# Patient Record
Sex: Female | Born: 2000 | Hispanic: Yes | Marital: Single | Smoking: Never smoker
Health system: Southern US, Community
[De-identification: ages and names within clinical notes are randomized; demographics above are authoritative.]

## PROBLEM LIST (undated history)

## (undated) DIAGNOSIS — R011 Cardiac murmur, unspecified: Secondary | ICD-10-CM

## (undated) DIAGNOSIS — R87629 Unspecified abnormal cytological findings in specimens from vagina: Secondary | ICD-10-CM

## (undated) DIAGNOSIS — F32A Depression, unspecified: Secondary | ICD-10-CM

## (undated) DIAGNOSIS — D696 Thrombocytopenia, unspecified: Secondary | ICD-10-CM

## (undated) DIAGNOSIS — D649 Anemia, unspecified: Secondary | ICD-10-CM

## (undated) DIAGNOSIS — F419 Anxiety disorder, unspecified: Secondary | ICD-10-CM

## (undated) HISTORY — PX: NEPHROSTOMY TUBE PLACEMENT (ARMC HX): HXRAD1726

---
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
## 2019-08-18 LAB — OB RESULTS CONSOLE RUBELLA ANTIBODY, IGM: Rubella: NON-IMMUNE/NOT IMMUNE

## 2019-08-23 DIAGNOSIS — O219 Vomiting of pregnancy, unspecified: Secondary | ICD-10-CM | POA: Insufficient documentation

## 2019-11-11 ENCOUNTER — Other Ambulatory Visit: Payer: Self-pay

## 2019-11-11 ENCOUNTER — Ambulatory Visit (HOSPITAL_COMMUNITY)
Admission: EM | Admit: 2019-11-11 | Discharge: 2019-11-11 | Disposition: A | Discharge status: Home / Self Care | Payer: Medicaid Other | Attending: Urgent Care | Admitting: Urgent Care

## 2019-11-11 ENCOUNTER — Encounter (HOSPITAL_COMMUNITY): Payer: Self-pay

## 2019-11-11 DIAGNOSIS — Z20822 Contact with and (suspected) exposure to covid-19: Secondary | ICD-10-CM | POA: Diagnosis not present

## 2019-11-11 NOTE — Discharge Instructions (Addendum)
We have tested you for COVID  Go home and quarantine until we get our results.

## 2019-11-11 NOTE — ED Triage Notes (Signed)
Pt present possible covid exposure from her son. Pt denies any symptoms at this time.

## 2019-11-12 LAB — SARS CORONAVIRUS 2 (TAT 6-24 HRS): SARS Coronavirus 2: NEGATIVE

## 2019-11-14 NOTE — ED Provider Notes (Signed)
[facility]    CSN: [id number] Arrival date & time: 11/11/19  1200      History   Chief Complaint Chief Complaint  Patient presents with  . Covid Exposure    HPI Victoria Garrett is a 19 y.o. female.   Patient is a 19 year old female who presents today for possible Covid exposure from her son.  She is currently having no symptoms.     History reviewed. No pertinent past medical history.  There are no problems to display for this patient.   History reviewed. No pertinent surgical history.  OB History    Gravida  1   Para      Term      Preterm      AB      Living        SAB      TAB      Ectopic      Multiple      Live Births               Home Medications    Prior to Admission medications   Not on File    Family History History reviewed. No pertinent family history.  Social History Social History   Tobacco Use  . Smoking status: Not on file  Substance Use Topics  . Alcohol use: Not on file  . Drug use: Not on file     Allergies   Patient has no known allergies.   Review of Systems Review of Systems   Physical Exam Triage Vital Signs ED Triage Vitals  Enc Vitals Group     BP 11/11/19 1421 118/61     Pulse Rate 11/11/19 1421 89     Resp 11/11/19 1421 16     Temp 11/11/19 1421 98.6 F (37 C)     Temp Source 11/11/19 1421 Oral     SpO2 11/11/19 1421 100 %     Weight --      Height --      Head Circumference --      Peak Flow --      Pain Score 11/11/19 1422 0     Pain Loc --      Pain Edu? --      Excl. in GC? --    No data found.  Updated Vital Signs BP 118/61 (BP Location: Right Arm)   Pulse 89   Temp 98.6 F (37 C) (Oral)   Resp 16   SpO2 100%   Visual Acuity Right Eye Distance:   Left Eye Distance:   Bilateral Distance:    Right Eye Near:   Left Eye Near:    Bilateral Near:     Physical Exam Vitals and nursing note reviewed.  Constitutional:      General: She is not in  acute distress.    Appearance: Normal appearance. She is not ill-appearing, toxic-appearing or diaphoretic.  HENT:     Head: Normocephalic.     Nose: Nose normal.  Eyes:     Conjunctiva/sclera: Conjunctivae normal.  Pulmonary:     Effort: Pulmonary effort is normal.  Musculoskeletal:        General: Normal range of motion.     Cervical back: Normal range of motion.  Skin:    General: Skin is warm and dry.     Findings: No rash.  Neurological:     Mental Status: She is alert.  Psychiatric:        Mood and Affect:  Mood normal.      UC Treatments / Results  Labs (all labs ordered are listed, but only abnormal results are displayed) Labs Reviewed  SARS CORONAVIRUS 2 (TAT 6-24 HRS)    EKG   Radiology No results found.  Procedures Procedures (including critical care time)  Medications Ordered in UC Medications - No data to display  Initial Impression / Assessment and Plan / UC Course  I have reviewed the triage vital signs and the nursing notes.  Pertinent labs & imaging results that were available during my care of the patient were reviewed by me and considered in my medical decision making (see chart for details).     Covid testing Swab pending Final Clinical Impressions(s) / UC Diagnoses   Final diagnoses:  Encounter for laboratory testing for COVID-19 virus     Discharge Instructions     We have tested you for COVID  Go home and quarantine until we get our results.       ED Prescriptions    None     PDMP not reviewed this encounter.   Dahlia Byes A, NP 11/14/19 [phone]

## 2019-11-16 ENCOUNTER — Other Ambulatory Visit: Payer: Self-pay

## 2019-11-16 ENCOUNTER — Encounter (HOSPITAL_COMMUNITY): Payer: Self-pay | Admitting: Emergency Medicine

## 2019-11-16 ENCOUNTER — Emergency Department (HOSPITAL_COMMUNITY)
Admission: EM | Admit: 2019-11-16 | Discharge: 2019-11-17 | Disposition: A | Discharge status: Home / Self Care | Payer: Medicaid Other | Attending: Emergency Medicine | Admitting: Emergency Medicine

## 2019-11-16 DIAGNOSIS — R200 Anesthesia of skin: Secondary | ICD-10-CM | POA: Diagnosis not present

## 2019-11-16 DIAGNOSIS — Z3A00 Weeks of gestation of pregnancy not specified: Secondary | ICD-10-CM | POA: Insufficient documentation

## 2019-11-16 DIAGNOSIS — O99891 Other specified diseases and conditions complicating pregnancy: Secondary | ICD-10-CM | POA: Insufficient documentation

## 2019-11-16 DIAGNOSIS — Z5321 Procedure and treatment not carried out due to patient leaving prior to being seen by health care provider: Secondary | ICD-10-CM | POA: Insufficient documentation

## 2019-11-16 LAB — COMPREHENSIVE METABOLIC PANEL
ALT: 15 U/L (ref 0–44)
AST: 16 U/L (ref 15–41)
Albumin: 3.6 g/dL (ref 3.5–5.0)
Alkaline Phosphatase: 49 U/L (ref 38–126)
Anion gap: 9 (ref 5–15)
BUN: <5 mg/dL — ABNORMAL LOW (ref 6–20)
CO2: 23 mmol/L (ref 22–32)
Calcium: 9.5 mg/dL (ref 8.9–10.3)
Chloride: 101 mmol/L (ref 98–111)
Creatinine, Ser: 0.35 mg/dL — ABNORMAL LOW (ref 0.44–1.00)
GFR calc Af Amer: >60 mL/min (ref >60)
GFR calc non Af Amer: >60 mL/min (ref >60)
Glucose, Bld: 103 mg/dL — ABNORMAL HIGH (ref 70–99)
Potassium: 3.2 mmol/L — ABNORMAL LOW (ref 3.5–5.1)
Sodium: 133 mmol/L — ABNORMAL LOW (ref 135–145)
Total Bilirubin: 0.6 mg/dL (ref 0.3–1.2)
Total Protein: 6.9 g/dL (ref 6.5–8.1)

## 2019-11-16 LAB — PROTIME-INR
INR: 1.1 (ref 0.8–1.2)
Prothrombin Time: 13.4 seconds (ref 11.4–15.2)

## 2019-11-16 LAB — CBC
HCT: 37.5 % (ref 36.0–46.0)
Hemoglobin: 12.8 g/dL (ref 12.0–15.0)
MCH: 28.8 pg (ref 26.0–34.0)
MCHC: 34.1 g/dL (ref 30.0–36.0)
MCV: 84.5 fL (ref 80.0–100.0)
Platelets: 124 10*3/uL — ABNORMAL LOW (ref 150–400)
RBC: 4.44 MIL/uL (ref 3.87–5.11)
RDW: 12.9 % (ref 11.5–15.5)
WBC: 8.5 10*3/uL (ref 4.0–10.5)
nRBC: 0 % (ref 0.0–0.2)

## 2019-11-16 LAB — DIFFERENTIAL
Abs Immature Granulocytes: 0.1 10*3/uL — ABNORMAL HIGH (ref 0.00–0.07)
Basophils Absolute: 0 10*3/uL (ref 0.0–0.1)
Basophils Relative: 0 %
Eosinophils Absolute: 0.1 10*3/uL (ref 0.0–0.5)
Eosinophils Relative: 2 %
Immature Granulocytes: 1 %
Lymphocytes Relative: 18 %
Lymphs Abs: 1.5 10*3/uL (ref 0.7–4.0)
Monocytes Absolute: 0.5 10*3/uL (ref 0.1–1.0)
Monocytes Relative: 6 %
Neutro Abs: 6.2 10*3/uL (ref 1.7–7.7)
Neutrophils Relative %: 73 %

## 2019-11-16 LAB — I-STAT CHEM 8, ED
BUN: <3 mg/dL — ABNORMAL LOW (ref 6–20)
Calcium, Ion: 1.29 mmol/L (ref 1.15–1.40)
Chloride: 101 mmol/L (ref 98–111)
Creatinine, Ser: 0.3 mg/dL — ABNORMAL LOW (ref 0.44–1.00)
Glucose, Bld: 100 mg/dL — ABNORMAL HIGH (ref 70–99)
HCT: 36 % (ref 36.0–46.0)
Hemoglobin: 12.2 g/dL (ref 12.0–15.0)
Potassium: 3.1 mmol/L — ABNORMAL LOW (ref 3.5–5.1)
Sodium: 138 mmol/L (ref 135–145)
TCO2: 24 mmol/L (ref 22–32)

## 2019-11-16 LAB — I-STAT BETA HCG BLOOD, ED (MC, WL, AP ONLY): I-stat hCG, quantitative: >2000 m[IU]/mL — ABNORMAL HIGH (ref <5)

## 2019-11-16 LAB — APTT: aPTT: 29 seconds (ref 24–36)

## 2019-11-16 MED ORDER — SODIUM CHLORIDE 0.9% FLUSH
3.0000 mL | Freq: Once | INTRAVENOUS | Status: DC
Start: 2019-11-16 — End: 2019-11-17

## 2019-11-16 NOTE — ED Triage Notes (Signed)
Pt presents to ED POV. Pt c/o intermittent L numbness. Reports that she does not know when it started. Pt states that @ 1720 she noticed her L foot was cyanotic and numb. LLE currently WDL, pulse +2 and cap < 3 seconds. Pt reports normal pregnancy

## 2019-11-17 NOTE — ED Notes (Signed)
Called for room x3 w/o response

## 2019-12-12 ENCOUNTER — Emergency Department (HOSPITAL_COMMUNITY)
Admission: EM | Admit: 2019-12-12 | Discharge: 2019-12-12 | Disposition: A | Discharge status: Home / Self Care | Payer: Medicaid Other | Attending: Emergency Medicine | Admitting: Emergency Medicine

## 2019-12-12 ENCOUNTER — Other Ambulatory Visit: Payer: Self-pay

## 2019-12-12 ENCOUNTER — Encounter (HOSPITAL_COMMUNITY): Payer: Self-pay | Admitting: Emergency Medicine

## 2019-12-12 DIAGNOSIS — R1010 Upper abdominal pain, unspecified: Secondary | ICD-10-CM | POA: Insufficient documentation

## 2019-12-12 DIAGNOSIS — Z20822 Contact with and (suspected) exposure to covid-19: Secondary | ICD-10-CM | POA: Insufficient documentation

## 2019-12-12 DIAGNOSIS — M545 Low back pain, unspecified: Secondary | ICD-10-CM | POA: Diagnosis not present

## 2019-12-12 DIAGNOSIS — R079 Chest pain, unspecified: Secondary | ICD-10-CM | POA: Insufficient documentation

## 2019-12-12 DIAGNOSIS — O26892 Other specified pregnancy related conditions, second trimester: Secondary | ICD-10-CM | POA: Diagnosis present

## 2019-12-12 DIAGNOSIS — O99891 Other specified diseases and conditions complicating pregnancy: Secondary | ICD-10-CM | POA: Insufficient documentation

## 2019-12-12 DIAGNOSIS — Z3A24 24 weeks gestation of pregnancy: Secondary | ICD-10-CM | POA: Insufficient documentation

## 2019-12-12 LAB — BASIC METABOLIC PANEL
Anion gap: 9 (ref 5–15)
BUN: <5 mg/dL — ABNORMAL LOW (ref 6–20)
CO2: 22 mmol/L (ref 22–32)
Calcium: 8.9 mg/dL (ref 8.9–10.3)
Chloride: 105 mmol/L (ref 98–111)
Creatinine, Ser: 0.38 mg/dL — ABNORMAL LOW (ref 0.44–1.00)
GFR, Estimated: >60 mL/min (ref >60)
Glucose, Bld: 85 mg/dL (ref 70–99)
Potassium: 3.4 mmol/L — ABNORMAL LOW (ref 3.5–5.1)
Sodium: 136 mmol/L (ref 135–145)

## 2019-12-12 LAB — URINALYSIS, ROUTINE W REFLEX MICROSCOPIC
Bilirubin Urine: NEGATIVE
Glucose, UA: NEGATIVE mg/dL
Hgb urine dipstick: NEGATIVE
Ketones, ur: 5 mg/dL — AB
Nitrite: NEGATIVE
Protein, ur: NEGATIVE mg/dL
Specific Gravity, Urine: 1.015 (ref 1.005–1.030)
pH: 7 (ref 5.0–8.0)

## 2019-12-12 LAB — CBC
HCT: 37 % (ref 36.0–46.0)
Hemoglobin: 12.5 g/dL (ref 12.0–15.0)
MCH: 29.3 pg (ref 26.0–34.0)
MCHC: 33.8 g/dL (ref 30.0–36.0)
MCV: 86.7 fL (ref 80.0–100.0)
Platelets: 105 10*3/uL — ABNORMAL LOW (ref 150–400)
RBC: 4.27 MIL/uL (ref 3.87–5.11)
RDW: 13.2 % (ref 11.5–15.5)
WBC: 7.9 10*3/uL (ref 4.0–10.5)
nRBC: 0 % (ref 0.0–0.2)

## 2019-12-12 LAB — TROPONIN I (HIGH SENSITIVITY)
Troponin I (High Sensitivity): <2 ng/L (ref <18)
Troponin I (High Sensitivity): 3 ng/L (ref <18)

## 2019-12-12 LAB — RESPIRATORY PANEL BY RT PCR (FLU A&B, COVID)
Influenza A by PCR: NEGATIVE
Influenza B by PCR: NEGATIVE
SARS Coronavirus 2 by RT PCR: NEGATIVE

## 2019-12-12 MED ORDER — ACETAMINOPHEN 500 MG PO TABS: 1000.0000 mg | ORAL_TABLET | Freq: Four times a day (QID) | ORAL | 0 refills | Status: DC | PRN

## 2019-12-12 MED ORDER — FAMOTIDINE 20 MG PO TABS: 20.0000 mg | ORAL_TABLET | Freq: Two times a day (BID) | ORAL | 1 refills | Status: DC

## 2019-12-12 NOTE — ED Notes (Signed)
[facility] at bedside 

## 2019-12-12 NOTE — Progress Notes (Signed)
Spoke with Dr. Jolayne Panther. Pt is obstetrically stable and can be OB cleared.

## 2019-12-12 NOTE — Discharge Instructions (Signed)
1.  Start taking Pepcid twice daily.  Follow instructions for gastroesophageal reflux disease.  This can commonly occur during pregnancy. 2.  Take Tylenol if needed for back pain. 3.  Follow-up with your obstetrician this week for recheck. 4.  Return to the emergency department if you develop a fever, any worsening symptoms or concerning symptoms.

## 2019-12-12 NOTE — ED Provider Notes (Signed)
[facility] EMERGENCY DEPARTMENT Provider Note   CSN: [id number] Arrival date & time: 12/12/19  1761     History No chief complaint on file.   Victoria Garrett is a 19 y.o. female.  HPI Patient reports she reports she has been having problems with discomfort in her upper abdomen and lower chest as well as her back in the flank region for a number of weeks off and on.  She reports she has seen her primary obstetrician regarding this.  She reports they have suspected it was due to increasing gestational age of the pregnancy.  Patient did have a complicated course in her first pregnancy with a severe pyelonephritis requiring nephrostomy tube.  She reports she became septic at that time.  With this pregnancy, she is not having any fevers or chills.  She is not having pain or burning with urination.  She has been having aching in her back bilaterally.  No vaginal discharge or bleeding.  She reports she has felt some general discomfort in her central chest.  Sometimes she feels some burning and some froth coming up in her throat.  She has had nausea but she has been able to eat and drink.  No cough.  No headache sore throat or body ache.  No swelling in the legs or the calves.    History reviewed. No pertinent past medical history.  There are no problems to display for this patient.   History reviewed. No pertinent surgical history.   OB History    Gravida  1   Para      Term      Preterm      AB      Living        SAB      TAB      Ectopic      Multiple      Live Births              No family history on file.  Social History   Tobacco Use  . Smoking status: Not on file  Substance Use Topics  . Alcohol use: Not on file  . Drug use: Not on file    Home Medications Prior to Admission medications   Medication Sig Start Date End Date Taking? Authorizing Provider  ondansetron (ZOFRAN-ODT) 4 MG disintegrating tablet Take 4 mg by mouth  daily as needed for nausea/vomiting. 09/29/19  Yes [provider]  acetaminophen (TYLENOL) 500 MG tablet Take 2 tablets (1,000 mg total) by mouth every 6 (six) hours as needed. 12/12/19   Arby Barrette, MD  famotidine (PEPCID) 20 MG tablet Take 1 tablet (20 mg total) by mouth 2 (two) times daily. 12/12/19   Arby Barrette, MD    Allergies    Patient has no known allergies.  Review of Systems   Review of Systems 10 systems reviewed and negative except as per HPI Physical Exam Updated Vital Signs BP 106/70   Pulse 72   Temp 98.6 F (37 C) (Oral)   Resp 19   Ht 5\' 2"  (1.575 m)   Wt 63.5 kg   SpO2 98%   BMI 25.61 kg/m   Physical Exam Constitutional:      Comments: Alert and well in appearance.  No respiratory distress.  Well-nourished well-developed.  HENT:     Head: Normocephalic and atraumatic.     Mouth/Throat:     Mouth: Mucous membranes are moist.     Pharynx: Oropharynx is clear.  Eyes:     Extraocular Movements: Extraocular movements intact.  Cardiovascular:     Rate and Rhythm: Normal rate and regular rhythm.  Pulmonary:     Effort: Pulmonary effort is normal.     Breath sounds: Normal breath sounds.  Abdominal:     Comments: Gravid abdomen diffuse discomfort to palpation upper abdomen.  Musculoskeletal:        General: No swelling or tenderness. Normal range of motion.     Right lower leg: No edema.     Left lower leg: No edema.  Skin:    General: Skin is warm and dry.  Neurological:     General: No focal deficit present.     Mental Status: She is oriented to person, place, and time.     Coordination: Coordination normal.  Psychiatric:        Mood and Affect: Mood normal.     ED Results / Procedures / Treatments   Labs (all labs ordered are listed, but only abnormal results are displayed) Labs Reviewed  BASIC METABOLIC PANEL - Abnormal; Notable for the following components:      Result Value   Potassium 3.4 (*)    BUN <5 (*)     Creatinine, Ser 0.38 (*)    All other components within normal limits  CBC - Abnormal; Notable for the following components:   Platelets 105 (*)    All other components within normal limits  URINALYSIS, ROUTINE W REFLEX MICROSCOPIC - Abnormal; Notable for the following components:   Color, Urine AMBER (*)    APPearance CLOUDY (*)    Ketones, ur 5 (*)    Leukocytes,Ua SMALL (*)    Bacteria, UA RARE (*)    All other components within normal limits  RESPIRATORY PANEL BY RT PCR (FLU A&B, COVID)  URINE CULTURE  TROPONIN I (HIGH SENSITIVITY)  TROPONIN I (HIGH SENSITIVITY)    EKG None  Radiology No results found.  Procedures Procedures (including critical care time)  Medications Ordered in ED Medications - No data to display  ED Course  I have reviewed the triage vital signs and the nursing notes.  Pertinent labs & imaging results that were available during my care of the patient were reviewed by me and considered in my medical decision making (see chart for details).    MDM Rules/Calculators/A&P                          [facility] has monitored the patient.  Good fetal heart tones.  No contractions.  No appearance of any immediate pregnancy related complications.  Patient is clinically well in appearance.  She has symptoms diffusely of abdominal discomfort, chest discomfort and back discomfort.  Currently, no hypoxia, fever or tachycardia.  Lungs are clear.  Review of systems does not suggest pulmonary infection.  Covid testing and flu testing is negative.  I have not proceeded with chest x-ray at this time due to lack of positive findings.  Patient also has epigastric discomfort and discomfort in her back.  Urinalysis is negative.  Will have urine cultured as she apparently had an episode of pyelonephritis that was severe during last pregnancy.  Symptoms have been going on for some time.  At this time it appears to be stable.  Plan will be to symptomatically treat with Pepcid for  possible GERD and gastritis type symptoms.  Tylenol if needed for back pain.  Close follow-up with OB/GYN and careful return precautions reviewed.  Final Clinical Impression(s) / ED Diagnoses Final diagnoses:  Pain of upper abdomen  [redacted] weeks gestation of pregnancy  Chest pain, unspecified type    Rx / DC Orders ED Discharge Orders         Ordered    famotidine (PEPCID) 20 MG tablet  2 times daily        12/12/19 1543    acetaminophen (TYLENOL) 500 MG tablet  Every 6 hours PRN        12/12/19 1543           Arby Barrette, MD 12/12/19 1550

## 2019-12-12 NOTE — ED Triage Notes (Signed)
Pt here with c/o left sided chest pain ,pt is around [redacted] weeks pregnant states had some chest pain with first pregnancy also and was seen at [facility]

## 2019-12-12 NOTE — Progress Notes (Signed)
Pt is a G2P1 at 24 6/[redacted] weeks gestation. She is here with c/o chest pain and left flank pain. She says that she had a lot of UTI's with her first pregnancy. A clean catch U/A was obtained and sent to the lab.I was told by the ED RN that her EKG was normal. Pt is not in any distress. Her skin is warm and dry and her v/s are within normal limits. No vaginal bleeding or leaking of fluid. She had a vaginal delivery with her son who is three years old.

## 2019-12-13 LAB — URINE CULTURE: Culture: <10000 — AB

## 2019-12-18 ENCOUNTER — Other Ambulatory Visit: Payer: Self-pay

## 2019-12-18 ENCOUNTER — Inpatient Hospital Stay (HOSPITAL_COMMUNITY)
Admission: AD | Admit: 2019-12-18 | Discharge: 2019-12-18 | Disposition: A | Discharge status: Home / Self Care | Payer: Medicaid Other | Attending: Obstetrics and Gynecology | Admitting: Obstetrics and Gynecology

## 2019-12-18 ENCOUNTER — Encounter (HOSPITAL_COMMUNITY): Payer: Self-pay | Admitting: Obstetrics & Gynecology

## 2019-12-18 DIAGNOSIS — O99342 Other mental disorders complicating pregnancy, second trimester: Secondary | ICD-10-CM | POA: Insufficient documentation

## 2019-12-18 DIAGNOSIS — Z79899 Other long term (current) drug therapy: Secondary | ICD-10-CM | POA: Insufficient documentation

## 2019-12-18 DIAGNOSIS — F410 Panic disorder [episodic paroxysmal anxiety] without agoraphobia: Secondary | ICD-10-CM | POA: Diagnosis not present

## 2019-12-18 DIAGNOSIS — O368120 Decreased fetal movements, second trimester, not applicable or unspecified: Secondary | ICD-10-CM | POA: Diagnosis not present

## 2019-12-18 DIAGNOSIS — Z3A25 25 weeks gestation of pregnancy: Secondary | ICD-10-CM | POA: Diagnosis not present

## 2019-12-18 DIAGNOSIS — F419 Anxiety disorder, unspecified: Secondary | ICD-10-CM | POA: Diagnosis not present

## 2019-12-18 HISTORY — DX: Anemia, unspecified: D64.9

## 2019-12-18 HISTORY — DX: Cardiac murmur, unspecified: R01.1

## 2019-12-18 MED ORDER — HYDROXYZINE HCL 25 MG PO TABS: 25.0000 mg | ORAL_TABLET | Freq: Three times a day (TID) | ORAL | 0 refills | Status: DC | PRN

## 2019-12-18 NOTE — MAU Provider Note (Signed)
Chief Complaint:  Decreased Fetal Movement  HPI: Victoria Garrett is a 19 y.o. G2P1001 at [redacted]w[redacted]d by LMP who presents to maternity admissions reporting decreased fetal movement since yesterday. Pt also reports that she was recently seen in the emergency room for chest pain that has since resolved. She is currently staying at "[facility]" and has daily anxiety. She denies safety concerns and thoughts of self harm. She was previously on zoloft but self discontinued this medication. No current prn medications for anxiety. Since starting on the monitors, pt reports that baby is now moving.  She denies LOF, vaginal bleeding, vaginal itching/burning, urinary symptoms, h/a, dizziness, n/v, or fever/chills.  Past Medical History: Past Medical History:  Diagnosis Date  . Anemia   . Heart murmur    as a child     Past obstetric history: OB History  Gravida Para Term Preterm AB Living  2 1 1     1   SAB TAB Ectopic Multiple Live Births          1    # Outcome Date GA Lbr Len/2nd Weight Sex Delivery Anes PTL Lv  2 Current           1 Term 02/05/16    02/07/16 Vag-Spont   LIV    Past Surgical History: Past Surgical History:  Procedure Laterality Date  . NEPHROSTOMY TUBE PLACEMENT ([facility] HX)      Family History: History reviewed. No pertinent family history.  Social History: Social History   Tobacco Use  . Smoking status: Never Smoker  . Smokeless tobacco: Never Used  Substance Use Topics  . Alcohol use: Not Currently  . Drug use: Never    Allergies: No Known Allergies  Meds:  No medications prior to admission.    ROS:  Review of Systems  Constitutional: Negative for chills and fever.  HENT: Negative for congestion and sore throat.   Eyes: Negative for photophobia and visual disturbance.  Respiratory: Negative for cough, chest tightness and shortness of breath.   Cardiovascular: Negative for chest pain.  Gastrointestinal: Negative for abdominal distention, abdominal pain,  constipation, diarrhea, nausea and vomiting.  Genitourinary: Negative for dysuria, vaginal bleeding, vaginal discharge and vaginal pain.  Musculoskeletal: Negative for back pain.  Skin: Negative for rash.  Neurological: Negative for headaches.   I have reviewed patient's Past Medical Hx, Surgical Hx, Family Hx, Social Hx, medications and allergies.   Physical Exam   Patient Vitals for the past 24 hrs:  BP Temp Temp src Pulse Resp SpO2 Height Weight  12/18/19 1050 116/65 -- -- -- 16 -- -- --  12/18/19 0940 121/68 98.6 F (37 C) Oral 100 16 97 % -- --  12/18/19 0935 -- -- -- -- -- -- 5\' 2"  (1.575 m) 67.1 kg   Constitutional: Well-developed, well-nourished female in no acute distress.  Cardiovascular: normal rate Respiratory: normal effort GI: Abd soft, non-tender, gravid appropriate for gestational age. MS: Extremities nontender, no edema, normal ROM Neurologic: Alert and oriented x 4.  GU: Neg CVAT.  PELVIC EXAM: Cervix pink, visually closed, without lesion, scant white creamy discharge, vaginal walls and external genitalia normal  FHT:  Baseline 145, moderate variability, accelerations present, intermittent variable decels appropriate for gestational age Contractions: none   Labs: No results found for this or any previous visit (from the past 24 hour(s)).    Imaging:  No results found.  MAU Course/MDM: Orders Placed This Encounter  Procedures  . Discharge patient Discharge disposition: 01-Home or  Self Care; Discharge patient date: 12/18/2019    Meds ordered this encounter  Medications  . hydrOXYzine (ATARAX/VISTARIL) 25 MG tablet    Sig: Take 1 tablet (25 mg total) by mouth every 8 (eight) hours as needed for anxiety.    Dispense:  30 tablet    Refill:  0     NST reviewed Treatments in MAU included: none Pt discharge with strict return precautions.  Assessment: 1. Anxiety: Pt reports daily anxiety and panic attacks. Previously on zoloft in pregnancy but  self-discontinued. Pt desires prn medication for panic attacks. -provided script for hydroxyzine 25mg  every 8 hours prn for anxiety -encouraged pt to discuss restarting zoloft or a different SSRI for improvement in anxiety/mood  2. Decreased fetal movements in second trimester, single or unspecified fetus: Pt presented given concern of decreased fetal movement. Reassuringly, pt reports baby moving well s/p initiation of fetal heart monitoring in MAU. In addition, NST reactive. -provided reassurance of normal fetal heart tones -provided strict return precautions for concerns of recurrent decreased fetal movement, vaginal bleeding, loss of fluid and contractions   Plan: Discharge home with plan to f/u with prenatal provider as previously scheduled.  Labor precautions and fetal kick counts.  Allergies as of 12/18/2019   No Known Allergies     Medication List    TAKE these medications   acetaminophen 500 MG tablet Commonly known as: TYLENOL Take 2 tablets (1,000 mg total) by mouth every 6 (six) hours as needed.   famotidine 20 MG tablet Commonly known as: PEPCID Take 1 tablet (20 mg total) by mouth 2 (two) times daily.   hydrOXYzine 25 MG tablet Commonly known as: ATARAX/VISTARIL Take 1 tablet (25 mg total) by mouth every 8 (eight) hours as needed for anxiety.   ondansetron 4 MG disintegrating tablet Commonly known as: ZOFRAN-ODT Take 4 mg by mouth daily as needed for nausea/vomiting.       12/20/2019, MD OB Fellow, [facility] 12/18/2019 5:44 PM

## 2019-12-18 NOTE — MAU Note (Signed)
Victoria Garrett is a 19 y.o. at [redacted]w[redacted]d here in MAU reporting: states she has not felt any fetal movement since Friday. States last night she was having chest pain but none currently.   Onset of complaint: ongoing  Pain score: 0/10  Vitals:   12/18/19 0940  BP: 121/68  Pulse: 100  Resp: 16  Temp: 98.6 F (37 C)  SpO2: 97%     FHT:161  Lab orders placed from triage: UA

## 2019-12-18 NOTE — Discharge Instructions (Signed)
You may take hydroxyzine (atarax) 1 tablet up to 3 times daily for anxiety. Take before bed if difficulty sleeping. I also encourage you to discuss restarting your zoloft or a different medication at your next prenatal appointment.  Your baby's heart rate was normal on the monitor. If you feel your baby is not moving well, drink a cold glass of water and lay flat.  Please keep your next prenatal appointment as scheduled and call sooner if safety concerns, vaginal bleeding, abdominal pain or other concerns.  Second Trimester of Pregnancy  The second trimester is from week 14 through week 27 (month 4 through 6). This is often the time in pregnancy that you feel your best. Often times, morning sickness has lessened or quit. You may have more energy, and you may get hungry more often. Your unborn baby is growing rapidly. At the end of the sixth month, he or she is about 9 inches long and weighs about 1 pounds. You will likely feel the baby move between 18 and 20 weeks of pregnancy. Follow these instructions at home: Medicines  Take over-the-counter and prescription medicines only as told by your doctor. Some medicines are safe and some medicines are not safe during pregnancy.  Take a prenatal vitamin that contains at least 600 micrograms (mcg) of folic acid.  If you have trouble pooping (constipation), take medicine that will make your stool soft (stool softener) if your doctor approves. Eating and drinking   Eat regular, healthy meals.  Avoid raw meat and uncooked cheese.  If you get low calcium from the food you eat, talk to your doctor about taking a daily calcium supplement.  Avoid foods that are high in fat and sugars, such as fried and sweet foods.  If you feel sick to your stomach (nauseous) or throw up (vomit): ? Eat 4 or 5 small meals a day instead of 3 large meals. ? Try eating a few soda crackers. ? Drink liquids between meals instead of during meals.  To prevent  constipation: ? Eat foods that are high in fiber, like fresh fruits and vegetables, whole grains, and beans. ? Drink enough fluids to keep your pee (urine) clear or pale yellow. Activity  Exercise only as told by your doctor. Stop exercising if you start to have cramps.  Do not exercise if it is too hot, too humid, or if you are in a place of great height (high altitude).  Avoid heavy lifting.  Wear low-heeled shoes. Sit and stand up straight.  You can continue to have sex unless your doctor tells you not to. Relieving pain and discomfort  Wear a good support bra if your breasts are tender.  Take warm water baths (sitz baths) to soothe pain or discomfort caused by hemorrhoids. Use hemorrhoid cream if your doctor approves.  Rest with your legs raised if you have leg cramps or low back pain.  If you develop puffy, bulging veins (varicose veins) in your legs: ? Wear support hose or compression stockings as told by your doctor. ? Raise (elevate) your feet for 15 minutes, 3-4 times a day. ? Limit salt in your food. Prenatal care  Write down your questions. Take them to your prenatal visits.  Keep all your prenatal visits as told by your doctor. This is important. Safety  Wear your seat belt when driving.  Make a list of emergency phone numbers, including numbers for family, friends, the hospital, and police and fire departments. General instructions  Ask your doctor about  the right foods to eat or for help finding a counselor, if you need these services.  Ask your doctor about local prenatal classes. Begin classes before month 6 of your pregnancy.  Do not use hot tubs, steam rooms, or saunas.  Do not douche or use tampons or scented sanitary pads.  Do not cross your legs for long periods of time.  Visit your dentist if you have not done so. Use a soft toothbrush to brush your teeth. Floss gently.  Avoid all smoking, herbs, and alcohol. Avoid drugs that are not approved by  your doctor.  Do not use any products that contain nicotine or tobacco, such as cigarettes and e-cigarettes. If you need help quitting, ask your doctor.  Avoid cat litter boxes and soil used by cats. These carry germs that can cause birth defects in the baby and can cause a loss of your baby (miscarriage) or stillbirth. Contact a doctor if:  You have mild cramps or pressure in your lower belly.  You have pain when you pee (urinate).  You have bad smelling fluid coming from your vagina.  You continue to feel sick to your stomach (nauseous), throw up (vomit), or have watery poop (diarrhea).  You have a nagging pain in your belly area.  You feel dizzy. Get help right away if:  You have a fever.  You are leaking fluid from your vagina.  You have spotting or bleeding from your vagina.  You have severe belly cramping or pain.  You lose or gain weight rapidly.  You have trouble catching your breath and have chest pain.  You notice sudden or extreme puffiness (swelling) of your face, hands, ankles, feet, or legs.  You have not felt the baby move in over an hour.  You have severe headaches that do not go away when you take medicine.  You have trouble seeing. Summary  The second trimester is from week 14 through week 27 (months 4 through 6). This is often the time in pregnancy that you feel your best.  To take care of yourself and your unborn baby, you will need to eat healthy meals, take medicines only if your doctor tells you to do so, and do activities that are safe for you and your baby.  Call your doctor if you get sick or if you notice anything unusual about your pregnancy. Also, call your doctor if you need help with the right food to eat, or if you want to know what activities are safe for you. This information is not intended to replace advice given to you by your health care provider. Make sure you discuss any questions you have with your health care provider. Document  Revised: 05/28/2018 Document Reviewed: 03/11/2016 Elsevier Patient Education  2020 [employer].

## 2019-12-21 ENCOUNTER — Other Ambulatory Visit: Payer: Self-pay

## 2019-12-21 ENCOUNTER — Encounter (HOSPITAL_COMMUNITY): Payer: Self-pay

## 2019-12-21 ENCOUNTER — Ambulatory Visit (HOSPITAL_COMMUNITY)
Admission: EM | Admit: 2019-12-21 | Discharge: 2019-12-21 | Disposition: A | Discharge status: Home / Self Care | Payer: Medicaid Other | Attending: Emergency Medicine | Admitting: Emergency Medicine

## 2019-12-21 DIAGNOSIS — R439 Unspecified disturbances of smell and taste: Secondary | ICD-10-CM | POA: Insufficient documentation

## 2019-12-21 DIAGNOSIS — Z3A26 26 weeks gestation of pregnancy: Secondary | ICD-10-CM | POA: Insufficient documentation

## 2019-12-21 DIAGNOSIS — J019 Acute sinusitis, unspecified: Secondary | ICD-10-CM | POA: Diagnosis not present

## 2019-12-21 DIAGNOSIS — O99891 Other specified diseases and conditions complicating pregnancy: Secondary | ICD-10-CM | POA: Diagnosis present

## 2019-12-21 DIAGNOSIS — Z20822 Contact with and (suspected) exposure to covid-19: Secondary | ICD-10-CM | POA: Insufficient documentation

## 2019-12-21 DIAGNOSIS — O99512 Diseases of the respiratory system complicating pregnancy, second trimester: Secondary | ICD-10-CM | POA: Diagnosis not present

## 2019-12-21 LAB — POCT RAPID STREP A, ED / UC: Streptococcus, Group A Screen (Direct): NEGATIVE

## 2019-12-21 MED ORDER — AMOXICILLIN 500 MG PO CAPS: 1000.0000 mg | ORAL_CAPSULE | Freq: Two times a day (BID) | ORAL | 0 refills | Status: AC

## 2019-12-21 NOTE — ED Provider Notes (Signed)
[facility]    CSN: [id number] Arrival date & time: 12/21/19  1405      History   Chief Complaint Chief Complaint  Patient presents with  . Loss of smell/taste  . Diarrhea  . Nasal Congestion    HPI Victoria Garrett is a 19 y.o. female approximately [redacted] weeks pregnant presenting today for evaluation of loss of taste and smell and URI symptoms.   Reports she has had nasal congestion over the past month, but symptoms significantly worsened in the past 24 to 48 hours.  Reports mucus has become discolored thick and has developed a loss of taste and smell.  Patient is staying in a community housing with other pregnant women.  She denies any abdominal pain.  Denies any vaginal bleeding or leakage of fluids.  Baby has been moving normally.  HPI  Past Medical History:  Diagnosis Date  . Anemia   . Heart murmur    as a child     There are no problems to display for this patient.   Past Surgical History:  Procedure Laterality Date  . NEPHROSTOMY TUBE PLACEMENT ([facility] HX)      OB History    Gravida  2   Para  1   Term  1   Preterm      AB      Living  1     SAB      TAB      Ectopic      Multiple      Live Births  1            Home Medications    Prior to Admission medications   Medication Sig Start Date End Date Taking? Authorizing Provider  amoxicillin (AMOXIL) 500 MG capsule Take 2 capsules (1,000 mg total) by mouth 2 (two) times daily for 7 days. 12/21/19 12/28/19  Ringo Sherod C, PA-C  famotidine (PEPCID) 20 MG tablet Take 1 tablet (20 mg total) by mouth 2 (two) times daily. 12/12/19   Arby Barrette, MD  hydrOXYzine (ATARAX/VISTARIL) 25 MG tablet Take 1 tablet (25 mg total) by mouth every 8 (eight) hours as needed for anxiety. 12/18/19   Sheila Oats, MD  ondansetron (ZOFRAN-ODT) 4 MG disintegrating tablet Take 4 mg by mouth daily as needed for nausea/vomiting. 09/29/19   [provider]    Family History Family  History  Problem Relation Age of Onset  . Healthy Mother   . Healthy Father     Social History Social History   Tobacco Use  . Smoking status: Never Smoker  . Smokeless tobacco: Never Used  Substance Use Topics  . Alcohol use: Not Currently  . Drug use: Never     Allergies   Patient has no known allergies.   Review of Systems Review of Systems  Constitutional: Negative for activity change, appetite change, chills, fatigue and fever.  HENT: Positive for congestion, rhinorrhea and sore throat. Negative for ear pain, sinus pressure and trouble swallowing.   Eyes: Negative for discharge and redness.  Respiratory: Positive for cough. Negative for chest tightness and shortness of breath.   Cardiovascular: Negative for chest pain.  Gastrointestinal: Negative for abdominal pain, diarrhea, nausea and vomiting.  Musculoskeletal: Negative for myalgias.  Skin: Negative for rash.  Neurological: Negative for dizziness, light-headedness and headaches.     Physical Exam Triage Vital Signs ED Triage Vitals  Enc Vitals Group     BP 12/21/19 1504 110/72     Pulse  Rate 12/21/19 1504 (!) 106     Resp 12/21/19 1504 17     Temp 12/21/19 1504 98.5 F (36.9 C)     Temp Source 12/21/19 1504 Oral     SpO2 12/21/19 1504 96 %     Weight --      Height --      Head Circumference --      Peak Flow --      Pain Score 12/21/19 1502 0     Pain Loc --      Pain Edu? --      Excl. in GC? --    No data found.  Updated Vital Signs BP 110/72 (BP Location: Right Arm)   Pulse (!) 106   Temp 98.5 F (36.9 C) (Oral)   Resp 17   SpO2 96%   Visual Acuity Right Eye Distance:   Left Eye Distance:   Bilateral Distance:    Right Eye Near:   Left Eye Near:    Bilateral Near:     Physical Exam Vitals and nursing note reviewed.  Constitutional:      Appearance: She is well-developed.     Comments: No acute distress  HENT:     Head: Normocephalic and atraumatic.     Ears:     Comments:  Bilateral ears without tenderness to palpation of external auricle, tragus and mastoid, EAC's without erythema or swelling, TM's with good bony landmarks and cone of light. Non erythematous.     Nose: Nose normal.     Mouth/Throat:     Comments: Oral mucosa pink and moist, no tonsillar enlargement or exudate. Posterior pharynx patent and nonerythematous, no uvula deviation or swelling. Normal phonation. Eyes:     Conjunctiva/sclera: Conjunctivae normal.  Cardiovascular:     Rate and Rhythm: Normal rate.  Pulmonary:     Effort: Pulmonary effort is normal. No respiratory distress.     Comments: Breathing comfortably at rest, CTABL, no wheezing, rales or other adventitious sounds auscultated Abdominal:     General: There is no distension.  Musculoskeletal:        General: Normal range of motion.     Cervical back: Neck supple.  Skin:    General: Skin is warm and dry.  Neurological:     Mental Status: She is alert and oriented to person, place, and time.      UC Treatments / Results  Labs (all labs ordered are listed, but only abnormal results are displayed) Labs Reviewed  SARS CORONAVIRUS 2 (TAT 6-24 HRS)  CULTURE, GROUP A STREP [facility])  POCT RAPID STREP A, ED / UC    EKG   Radiology No results found.  Procedures Procedures (including critical care time)  Medications Ordered in UC Medications - No data to display  Initial Impression / Assessment and Plan / UC Course  I have reviewed the triage vital signs and the nursing notes.  Pertinent labs & imaging results that were available during my care of the patient were reviewed by me and considered in my medical decision making (see chart for details).     Covid test pending, 19 year old weak female with 1 month of nasal congestion with recent worsening.  Treating for sinusitis.  Amoxicillin x1 week.  Symptomatic and supportive care, provided list of medicine safe in pregnancy.  Discussed strict return precautions.  Patient verbalized understanding and is agreeable with plan.  Final Clinical Impressions(s) / UC Diagnoses   Final diagnoses:  Acute sinusitis with symptoms > 10  days     Discharge Instructions     Begin amoxicillin twice daily for 1 week Drink plenty of fluids May use list provided for further mucous relief Follow up if not improving or worsening    ED Prescriptions    Medication Sig Dispense Auth. Provider   amoxicillin (AMOXIL) 500 MG capsule Take 2 capsules (1,000 mg total) by mouth 2 (two) times daily for 7 days. 28 capsule Dmauri Rosenow, [place] C, PA-C     PDMP not reviewed this encounter.   Lew Dawes, PA-C 12/21/19 1526

## 2019-12-21 NOTE — ED Triage Notes (Signed)
Pt is here with loss of smell/taste that started a few hours ago, pt has a sore throat and nasal congestion that started last night. Pt has not taken to relieve discomfort.

## 2019-12-21 NOTE — Discharge Instructions (Signed)
Begin amoxicillin twice daily for 1 week Drink plenty of fluids May use list provided for further mucous relief Follow up if not improving or worsening

## 2019-12-22 LAB — SARS CORONAVIRUS 2 (TAT 6-24 HRS): SARS Coronavirus 2: NEGATIVE

## 2019-12-23 LAB — CULTURE, GROUP A STREP (THRC)

## 2020-01-04 LAB — OB RESULTS CONSOLE GC/CHLAMYDIA
Chlamydia: NEGATIVE
Gonorrhea: NEGATIVE

## 2020-01-04 LAB — OB RESULTS CONSOLE HGB/HCT, BLOOD
HCT: 37 (ref 29–41)
Hemoglobin: 12.6

## 2020-01-04 LAB — OB RESULTS CONSOLE PLATELET COUNT: Platelets: 127

## 2020-01-04 LAB — OB RESULTS CONSOLE HIV ANTIBODY (ROUTINE TESTING): HIV: NONREACTIVE

## 2020-02-13 DIAGNOSIS — Z3480 Encounter for supervision of other normal pregnancy, unspecified trimester: Secondary | ICD-10-CM | POA: Insufficient documentation

## 2020-02-13 DIAGNOSIS — Z3490 Encounter for supervision of normal pregnancy, unspecified, unspecified trimester: Secondary | ICD-10-CM | POA: Insufficient documentation

## 2020-02-13 HISTORY — DX: Encounter for supervision of other normal pregnancy, unspecified trimester: Z34.80

## 2020-02-14 ENCOUNTER — Encounter: Payer: Self-pay | Admitting: Advanced Practice Midwife

## 2020-02-14 ENCOUNTER — Ambulatory Visit (INDEPENDENT_AMBULATORY_CARE_PROVIDER_SITE_OTHER): Payer: Medicaid Other | Admitting: Advanced Practice Midwife

## 2020-02-14 ENCOUNTER — Other Ambulatory Visit: Payer: Self-pay

## 2020-02-14 DIAGNOSIS — Z23 Encounter for immunization: Secondary | ICD-10-CM

## 2020-02-14 DIAGNOSIS — Z609 Problem related to social environment, unspecified: Secondary | ICD-10-CM | POA: Insufficient documentation

## 2020-02-14 DIAGNOSIS — Z659 Problem related to unspecified psychosocial circumstances: Secondary | ICD-10-CM

## 2020-02-14 DIAGNOSIS — F411 Generalized anxiety disorder: Secondary | ICD-10-CM

## 2020-02-14 DIAGNOSIS — Z3480 Encounter for supervision of other normal pregnancy, unspecified trimester: Secondary | ICD-10-CM

## 2020-02-14 DIAGNOSIS — F410 Panic disorder [episodic paroxysmal anxiety] without agoraphobia: Secondary | ICD-10-CM | POA: Insufficient documentation

## 2020-02-14 DIAGNOSIS — Z3A34 34 weeks gestation of pregnancy: Secondary | ICD-10-CM | POA: Diagnosis not present

## 2020-02-14 DIAGNOSIS — Z8759 Personal history of other complications of pregnancy, childbirth and the puerperium: Secondary | ICD-10-CM

## 2020-02-14 DIAGNOSIS — Z3483 Encounter for supervision of other normal pregnancy, third trimester: Secondary | ICD-10-CM | POA: Diagnosis not present

## 2020-02-14 DIAGNOSIS — Z87440 Personal history of urinary (tract) infections: Secondary | ICD-10-CM | POA: Insufficient documentation

## 2020-02-14 DIAGNOSIS — F329 Major depressive disorder, single episode, unspecified: Secondary | ICD-10-CM | POA: Insufficient documentation

## 2020-02-14 DIAGNOSIS — O99340 Other mental disorders complicating pregnancy, unspecified trimester: Secondary | ICD-10-CM

## 2020-02-14 HISTORY — DX: Personal history of urinary (tract) infections: Z87.440

## 2020-02-14 HISTORY — DX: Problem related to social environment, unspecified: Z60.9

## 2020-02-14 HISTORY — DX: Panic disorder (episodic paroxysmal anxiety): F41.0

## 2020-02-14 HISTORY — DX: Personal history of urinary (tract) infections: Z87.59

## 2020-02-14 NOTE — Progress Notes (Signed)
Subjective:   Victoria Garrett is a 19 y.o. G2P1001 at [redacted]w[redacted]d by LMP c/w [redacted]w[redacted]d [place] being seen today for her first obstetrical visit. She is transferring care from [facility]. Her obstetrical history is significant for history of pyelo in previous pregnancy, previous SVD at term and has Supervision of other normal pregnancy, antepartum; Generalized anxiety disorder with panic attacks; Major depressive disorder affecting pregnancy; and Poor social situation on their problem list.. Patient does intend to breast feed. Pregnancy history fully reviewed.  Patient reports intermittent, occasional dizziness, more so when she stands up. She reports that she has intermittent nausea and does not eat or drink a lot. Has rx for antiemetics, however does not take them because she feels as if she does not need them because she only vomits once in the morning and then feels better. She reportedly only drinks milk or "a powdered [place] drink" because "the baby does not like water". She also does not eat much food for the same reason. She reports she eats a bagel for breakfast or salad for lunch. No contractions, LOF, or vaginal bleeding. Reports active fetal movement.   HISTORY: OB History  Gravida Para Term Preterm AB Living  2 1 1  0 0 1  SAB IAB Ectopic Multiple Live Births  0 0 0 0 1    # Outcome Date GA Lbr Len/2nd Weight Sex Delivery Anes PTL Lv  2 Current           1 Term 02/05/16    02/07/16 Vag-Spont   LIV   Past Medical History:  Diagnosis Date  . Anemia   . Heart murmur    as a child    Past Surgical History:  Procedure Laterality Date  . NEPHROSTOMY TUBE PLACEMENT ([facility] HX)     Family History  Problem Relation Age of Onset  . Healthy Mother   . Healthy Father    Social History   Tobacco Use  . Smoking status: Never Smoker  . Smokeless tobacco: Never Used  Vaping Use  . Vaping Use: Never used  Substance Use Topics  . Alcohol use: Not Currently  . Drug use: Never   No  Known Allergies Current Outpatient Medications on File Prior to Visit  Medication Sig Dispense Refill  . famotidine (PEPCID) 20 MG tablet Take 1 tablet (20 mg total) by mouth 2 (two) times daily. 60 tablet 1  . hydrOXYzine (ATARAX/VISTARIL) 25 MG tablet Take 1 tablet (25 mg total) by mouth every 8 (eight) hours as needed for anxiety. (Patient not taking: Reported on 02/14/2020) 30 tablet 0  . ondansetron (ZOFRAN-ODT) 4 MG disintegrating tablet Take 4 mg by mouth daily as needed for nausea/vomiting. (Patient not taking: Reported on 02/14/2020)     No current facility-administered medications on file prior to visit.    Exam   Vitals:   02/14/20 1131  BP: 125/82  Pulse: (!) 113  Weight: 160 lb (72.6 kg)   Fetal Heart Rate (bpm): 149  Uterus:     Pelvic Exam: Perineum: deferred   Vulva: deferred   Vagina:  deferred   Cervix: deferred    Adnexa: deferred   Bony Pelvis: Not examined  System: General: well-developed, well-nourished female in no acute distress   Breast:  deferred   Skin: normal coloration and turgor, no rashes   Neurologic: oriented, normal, negative, normal mood   Extremities: normal strength, tone, and muscle mass, ROM of all joints is normal   HEENT  PERRLA, extraocular movement intact and sclera clear, anicteric   Mouth/Teeth mucous membranes moist, pharynx normal without lesions and dental hygiene good   Neck supple and no masses   Cardiovascular: regular rate and rhythm   Respiratory:  no respiratory distress, normal breath sounds   Abdomen: soft, non-tender; bowel sounds normal; no masses,  no organomegaly   Fundal Height: 34cm FHR: 149bpm, vertex by Leopold's  Assessment:   Pregnancy: G2P1001 Patient Active Problem List   Diagnosis Date Noted  . Generalized anxiety disorder with panic attacks 02/14/2020  . Major depressive disorder affecting pregnancy 02/14/2020  . Poor social situation 02/14/2020  . Supervision of other normal pregnancy, antepartum  02/13/2020     Plan:  1. Supervision of other normal pregnancy, antepartum - Continue prenatal vitamins. - Reviewed prenatal records and problem list updated. - The nature of [facility] with multiple MDs and other Advanced Practice Providers was explained to patient; also emphasized that residents, students are part of our team. - Routine obstetric and preterm labor precautions reviewed. - Discussed healthier diet and encouraged increase in water intake. Encouraged small, frequent meals/snacks that are high in protein. - Recommend using antiemetics as prescribed for nausea. Change positions slowly.  - Anticipatory guidance for upcoming appointment - GBS and GC/CT at next visit  2. Generalized anxiety disorder with panic attacks - Stable - Has vistaril rx prn  3. Major depressive disorder affecting pregnancy - Stable; no SI/HI - Not currently taking medications - Sees counselor regularly - Reviewed warning s/s  4. Poor social situation - Currently living at [facility] with her 20 year old - Feels safe  5. History of pyelonephritis during pregnancy - Had nephrostomy tube at that time - Normal PP renal US - Urine culture at [facility] negative - No s/s today   Return in about 2 weeks (around 02/28/2020).   Brand Males, CNM 02/14/20 12:20 PM

## 2020-02-14 NOTE — Progress Notes (Addendum)
NOB 34.0 weeks transfer from [facility].  Patient already had her PN labs done.  C/o painful sex, odor, white discharge.  PHQ-9=8  TDAP and FLU Vaccines given in RD, tolerated well.

## 2020-02-18 NOTE — L&D Delivery Note (Addendum)
OB/GYN Faculty Practice Delivery Note  Victoria Garrett is a 20 y.o. G2P1001 s/p SVD at [redacted]w[redacted]d. She was admitted for PROM.   ROM: 16h 26m with clear fluid GBS Status:  Negative/-- (01/11 0312) Maximum Maternal Temperature: 98.7  Labor Progress: . Initial SVE: 2cm. She then progressed to complete.   Delivery Date/Time: 2/8 at 0420 Delivery: Called to room and patient was complete and pushing. Head delivered LOP. Tight nuchal cord present. Shoulder and body delivered in usual fashion. Infant with poor tone and no spontaneous cry, placed on mother's abdomen, dried and stimulated. Cord clamped x 2 without delay, and cut by myself. Cord blood drawn. Placenta delivered spontaneously with gentle cord traction. Fundus firm with massage and Pitocin. Labia, perineum, vagina, and cervix inspected inspected with no lacerations.   Baby Weight: pending  Placenta: Sent to L&D Complications: None Lacerations: none EBL: 50 mL Analgesia: Epidural   Infant:  APGAR (1 MIN): 5   APGAR (5 MINS): 9   APGAR (10 MINS):     Casper Harrison, MD [facility] Fellow, [facility] for [facility], [facility] 03/27/2020, 4:34 AM

## 2020-02-25 ENCOUNTER — Encounter (HOSPITAL_COMMUNITY): Payer: Self-pay | Admitting: Family Medicine

## 2020-02-25 ENCOUNTER — Inpatient Hospital Stay (HOSPITAL_COMMUNITY)
Admission: AD | Admit: 2020-02-25 | Discharge: 2020-02-25 | Disposition: A | Discharge status: Home / Self Care | Payer: Medicaid Other | Attending: Family Medicine | Admitting: Family Medicine

## 2020-02-25 ENCOUNTER — Other Ambulatory Visit: Payer: Self-pay

## 2020-02-25 DIAGNOSIS — Z3689 Encounter for other specified antenatal screening: Secondary | ICD-10-CM

## 2020-02-25 DIAGNOSIS — Z3A35 35 weeks gestation of pregnancy: Secondary | ICD-10-CM | POA: Diagnosis not present

## 2020-02-25 DIAGNOSIS — O4703 False labor before 37 completed weeks of gestation, third trimester: Secondary | ICD-10-CM | POA: Insufficient documentation

## 2020-02-25 DIAGNOSIS — O26893 Other specified pregnancy related conditions, third trimester: Secondary | ICD-10-CM | POA: Diagnosis present

## 2020-02-25 DIAGNOSIS — O479 False labor, unspecified: Secondary | ICD-10-CM

## 2020-02-25 NOTE — MAU Note (Signed)
Victoria Garrett is a 20 y.o. at [redacted]w[redacted]d here in MAU reporting: has been having contractions for a couple of days and today she was having diarrhea and while she was using the bathroom she felt like she also needed to push the baby out and she wasn't sure if that was normal./  Onset of complaint: today  Lab orders placed from triage: UA

## 2020-02-25 NOTE — MAU Provider Note (Signed)
History     CSN: [id number]  Arrival date and time: 02/25/20 1108   Event Date/Time   First Provider Initiated Contact with Patient 02/25/20 1150      Chief Complaint  Patient presents with  . Contractions   HPI This is a 20yo G2P1001 at [redacted]w[redacted]d presenting with the feelings like she needs to push. This started earlier today after using the bathroom - she's been having diarrhea. After using the bathroom, she felt like she still needed to defecate and it felt like the baby was starting to come. Having some contractions and back pain.    Also feeling mild chest pressure when she is laying down. Position doesn't matter, whether she is on her sides or laying on her back. Mild SOB as well. Goes away when she sits or stands up. No chest pressure or SOB when she is walking. Denies cough, fever, chills. OB History    Gravida  2   Para  1   Term  1   Preterm      AB      Living  1     SAB      IAB      Ectopic      Multiple      Live Births  1           Past Medical History:  Diagnosis Date  . Anemia   . Heart murmur    as a child     Past Surgical History:  Procedure Laterality Date  . NEPHROSTOMY TUBE PLACEMENT ([facility] HX)      Family History  Problem Relation Age of Onset  . Healthy Mother   . Healthy Father     Social History   Tobacco Use  . Smoking status: Never Smoker  . Smokeless tobacco: Never Used  Vaping Use  . Vaping Use: Never used  Substance Use Topics  . Alcohol use: Not Currently  . Drug use: Never    Allergies: No Known Allergies  Medications Prior to Admission  Medication Sig Dispense Refill Last Dose  . famotidine (PEPCID) 20 MG tablet Take 1 tablet (20 mg total) by mouth 2 (two) times daily. 60 tablet 1 02/21/2020 at Unknown time  . hydrOXYzine (ATARAX/VISTARIL) 25 MG tablet Take 1 tablet (25 mg total) by mouth every 8 (eight) hours as needed for anxiety. (Patient not taking: Reported on 02/14/2020) 30 tablet 0   . ondansetron  (ZOFRAN-ODT) 4 MG disintegrating tablet Take 4 mg by mouth daily as needed for nausea/vomiting. (Patient not taking: Reported on 02/14/2020)       Review of Systems Physical Exam   Blood pressure 124/73, pulse 97, temperature 98.5 F (36.9 C), temperature source Oral, resp. rate 18, SpO2 98 %.  Physical Exam Constitutional:      Appearance: Normal appearance.  Cardiovascular:     Pulses: Normal pulses.     Heart sounds: No murmur heard. No friction rub. No gallop.   Pulmonary:     Effort: Pulmonary effort is normal.  Abdominal:     General: Abdomen is flat.     Palpations: Abdomen is soft.  Skin:    Capillary Refill: Capillary refill takes less than 2 seconds.  Neurological:     General: No focal deficit present.     Mental Status: She is alert.  Psychiatric:        Mood and Affect: Mood normal.        Behavior: Behavior normal.  Thought Content: Thought content normal.        Judgment: Judgment normal.    Effacement (%): Thick Cervical Position: Posterior Station:  (High) Exam by:: Dr. Adrian Blackwater  MAU Course  Procedures NST - baseline 130s, mod variability, accels, no decels.  MDM No cervical change No ventilation changes. I do not believe the patient's positional chest pressure is cardiac or pulmonary related - likely secondary to abdominal pressure due to gravid uterus.  Assessment and Plan     ICD-10-CM   1. [redacted] weeks gestation of pregnancy  Z3A.35   2. False labor  O47.9   3. NST (non-stress test) reactive  Z36.89    Patient sent home - f/u outpatient. Return precautions given.  Levie Heritage 02/25/2020, 12:00 PM

## 2020-02-28 ENCOUNTER — Ambulatory Visit (INDEPENDENT_AMBULATORY_CARE_PROVIDER_SITE_OTHER): Payer: Medicaid Other | Admitting: Advanced Practice Midwife

## 2020-02-28 ENCOUNTER — Other Ambulatory Visit: Payer: Self-pay

## 2020-02-28 ENCOUNTER — Other Ambulatory Visit (HOSPITAL_COMMUNITY)
Admission: RE | Admit: 2020-02-28 | Discharge: 2020-02-28 | Disposition: A | Discharge status: Home / Self Care | Payer: Medicaid Other | Source: Ambulatory Visit | Attending: Advanced Practice Midwife | Admitting: Advanced Practice Midwife

## 2020-02-28 VITALS — BP 120/76 | HR 100 | Wt 164.0 lb

## 2020-02-28 DIAGNOSIS — Z3483 Encounter for supervision of other normal pregnancy, third trimester: Secondary | ICD-10-CM | POA: Diagnosis present

## 2020-02-28 DIAGNOSIS — D696 Thrombocytopenia, unspecified: Secondary | ICD-10-CM | POA: Insufficient documentation

## 2020-02-28 DIAGNOSIS — O99113 Other diseases of the blood and blood-forming organs and certain disorders involving the immune mechanism complicating pregnancy, third trimester: Secondary | ICD-10-CM

## 2020-02-28 DIAGNOSIS — O4703 False labor before 37 completed weeks of gestation, third trimester: Secondary | ICD-10-CM | POA: Insufficient documentation

## 2020-02-28 DIAGNOSIS — Z3A36 36 weeks gestation of pregnancy: Secondary | ICD-10-CM

## 2020-02-28 DIAGNOSIS — R42 Dizziness and giddiness: Secondary | ICD-10-CM

## 2020-02-28 DIAGNOSIS — Z3480 Encounter for supervision of other normal pregnancy, unspecified trimester: Secondary | ICD-10-CM

## 2020-02-28 HISTORY — DX: Thrombocytopenia, unspecified: D69.6

## 2020-02-28 HISTORY — DX: False labor before 37 completed weeks of gestation, third trimester: O47.03

## 2020-02-28 NOTE — Patient Instructions (Signed)
Reasons to return to MAU at [facility]:  Since you are preterm, return to MAU if:  1.  Contractions are 10 minutes apart or less and they becoming more uncomfortable or painful over time 2.  You have a large gush of fluid, or a trickle of fluid that will not stop and you have to wear a pad 3.  You have bleeding that is bright red, heavier than spotting--like menstrual bleeding (spotting can be normal in early labor or after a check of your cervix) 4.  You do not feel the baby moving like he/she normally does  

## 2020-02-28 NOTE — Progress Notes (Signed)
ROB/GBS, c/o contractions, pain.

## 2020-02-28 NOTE — Progress Notes (Signed)
   PRENATAL VISIT NOTE  Subjective:  Victoria Garrett is a 20 y.o. G2P1001 at 106w0d being seen today for ongoing prenatal care.  She is currently monitored for the following issues for this low-risk pregnancy and has Supervision of other normal pregnancy, antepartum; Generalized anxiety disorder with panic attacks; Major depressive disorder affecting pregnancy; Poor social situation; and History of pyelonephritis during pregnancy on their problem list.  Patient reports contractions 3 x per hour, pelvic pressure, intermittent dizziness.  Contractions: Irregular. Vag. Bleeding: None.  Movement: Present. Denies leaking of fluid.   The following portions of the patient's history were reviewed and updated as appropriate: allergies, current medications, past family history, past medical history, past social history, past surgical history and problem list.   Objective:   Vitals:   02/28/20 1428  BP: 120/76  Pulse: 100  Weight: 164 lb (74.4 kg)    Fetal Status: Fetal Heart Rate (bpm): 160   Movement: Present     General:  Alert, oriented and cooperative. Patient is in no acute distress.  Skin: Skin is warm and dry. No rash noted.   Cardiovascular: Normal heart rate noted  Respiratory: Normal respiratory effort, no problems with respiration noted  Abdomen: Soft, gravid, appropriate for gestational age.  Pain/Pressure: Present     Pelvic: Cervical exam performed in the presence of a chaperone        Extremities: Normal range of motion.  Edema: Trace  Mental Status: Normal mood and affect. Normal behavior. Normal judgment and thought content.   Assessment and Plan:  Pregnancy: G2P1001 at [redacted]w[redacted]d 1. Supervision of other normal pregnancy, antepartum --Anticipatory guidance about next visits/weeks of pregnancy given. --Next visit in 1 week in the office  2. [redacted] weeks gestation of pregnancy  - Cervicovaginal ancillary only( [place]) - Strep Gp B NAA   3. Preterm uterine contractions  in third trimester, antepartum --Pt was seen in MAU on 02/25/20 with contractions with cervix unchanged today so no signs of preterm labor --Will check urine today, preterm labor precautions reviewed  - Culture, OB Urine - POCT urinalysis dipstick  4. Dizziness --Pt reports intermittent dizziness, yesterday was worse and she had to sit down for a while. She denies dizziness currently.  She reports eating and drinking regularly and ate some chicken about 2 hours before yesterday's episode. --Pt encouraged to stay hydrated and eat enough protein  --Reviewed reasons to seek emergency care - CBC - Comp Met (CMET)  Preterm labor symptoms and general obstetric precautions including but not limited to vaginal bleeding, contractions, leaking of fluid and fetal movement were reviewed in detail with the patient. Please refer to After Visit Summary for other counseling recommendations.   No follow-ups on file.  No future appointments.  Fatima Blank, CNM

## 2020-02-29 ENCOUNTER — Encounter (HOSPITAL_COMMUNITY): Payer: Self-pay | Admitting: Obstetrics and Gynecology

## 2020-02-29 ENCOUNTER — Other Ambulatory Visit: Payer: Self-pay

## 2020-02-29 ENCOUNTER — Inpatient Hospital Stay (HOSPITAL_COMMUNITY)
Admission: AD | Admit: 2020-02-29 | Discharge: 2020-02-29 | Disposition: A | Discharge status: Home / Self Care | Payer: Medicaid Other | Attending: Family Medicine | Admitting: Family Medicine

## 2020-02-29 DIAGNOSIS — M791 Myalgia, unspecified site: Secondary | ICD-10-CM | POA: Diagnosis not present

## 2020-02-29 DIAGNOSIS — Z3A36 36 weeks gestation of pregnancy: Secondary | ICD-10-CM | POA: Diagnosis not present

## 2020-02-29 DIAGNOSIS — R109 Unspecified abdominal pain: Secondary | ICD-10-CM | POA: Insufficient documentation

## 2020-02-29 DIAGNOSIS — R5383 Other fatigue: Secondary | ICD-10-CM | POA: Diagnosis not present

## 2020-02-29 DIAGNOSIS — O99891 Other specified diseases and conditions complicating pregnancy: Secondary | ICD-10-CM

## 2020-02-29 DIAGNOSIS — O26813 Pregnancy related exhaustion and fatigue, third trimester: Secondary | ICD-10-CM

## 2020-02-29 DIAGNOSIS — Z20822 Contact with and (suspected) exposure to covid-19: Secondary | ICD-10-CM | POA: Diagnosis not present

## 2020-02-29 DIAGNOSIS — R42 Dizziness and giddiness: Secondary | ICD-10-CM | POA: Insufficient documentation

## 2020-02-29 DIAGNOSIS — O26893 Other specified pregnancy related conditions, third trimester: Secondary | ICD-10-CM | POA: Insufficient documentation

## 2020-02-29 DIAGNOSIS — Z3480 Encounter for supervision of other normal pregnancy, unspecified trimester: Secondary | ICD-10-CM

## 2020-02-29 LAB — URINALYSIS, ROUTINE W REFLEX MICROSCOPIC
Bilirubin Urine: NEGATIVE
Glucose, UA: NEGATIVE mg/dL
Hgb urine dipstick: NEGATIVE
Ketones, ur: NEGATIVE mg/dL
Nitrite: NEGATIVE
Protein, ur: NEGATIVE mg/dL
Specific Gravity, Urine: 1.012 (ref 1.005–1.030)
pH: 6 (ref 5.0–8.0)

## 2020-02-29 LAB — COMPREHENSIVE METABOLIC PANEL
ALT: 12 IU/L (ref 0–32)
AST: 9 IU/L (ref 0–40)
Albumin/Globulin Ratio: 1.3 (ref 1.2–2.2)
Albumin: 3.8 g/dL — ABNORMAL LOW (ref 3.9–5.0)
Alkaline Phosphatase: 127 IU/L — ABNORMAL HIGH (ref 42–106)
BUN/Creatinine Ratio: 10 (ref 9–23)
BUN: 4 mg/dL — ABNORMAL LOW (ref 6–20)
Bilirubin Total: <0.2 mg/dL (ref 0.0–1.2)
CO2: 22 mmol/L (ref 20–29)
Calcium: 8.9 mg/dL (ref 8.7–10.2)
Chloride: 104 mmol/L (ref 96–106)
Creatinine, Ser: 0.41 mg/dL — ABNORMAL LOW (ref 0.57–1.00)
GFR calc Af Amer: 172 mL/min/{1.73_m2} (ref >59)
GFR calc non Af Amer: 149 mL/min/{1.73_m2} (ref >59)
Globulin, Total: 2.9 g/dL (ref 1.5–4.5)
Glucose: 88 mg/dL (ref 65–99)
Potassium: 3.8 mmol/L (ref 3.5–5.2)
Sodium: 140 mmol/L (ref 134–144)
Total Protein: 6.7 g/dL (ref 6.0–8.5)

## 2020-02-29 LAB — CBC
Hematocrit: 34.3 % (ref 34.0–46.6)
Hemoglobin: 11.6 g/dL (ref 11.1–15.9)
MCH: 27.6 pg (ref 26.6–33.0)
MCHC: 33.8 g/dL (ref 31.5–35.7)
MCV: 82 fL (ref 79–97)
Platelets: 107 10*3/uL — ABNORMAL LOW (ref 150–450)
RBC: 4.21 x10E6/uL (ref 3.77–5.28)
RDW: 12.5 % (ref 11.7–15.4)
WBC: 8.4 10*3/uL (ref 3.4–10.8)

## 2020-02-29 LAB — RAPID URINE DRUG SCREEN, HOSP PERFORMED
Amphetamines: NOT DETECTED
Barbiturates: NOT DETECTED
Benzodiazepines: NOT DETECTED
Cocaine: NOT DETECTED
Opiates: NOT DETECTED
Tetrahydrocannabinol: NOT DETECTED

## 2020-02-29 LAB — SARS CORONAVIRUS 2 (TAT 6-24 HRS): SARS Coronavirus 2: NEGATIVE

## 2020-02-29 NOTE — MAU Note (Signed)
Presents with c/o fatigue, dizziness, and joint soreness, reports symptoms began last week.  States had blood work drawn @ MD office yesterday d/t symptoms, hadn't been notified of results.  States needed to be evaluated today because several times she's caught herself from falling, hasn't actually fallen.  Denies LOF or VB.  Endorses +FM.

## 2020-02-29 NOTE — MAU Provider Note (Signed)
MAU HISTORY AND PHYSICAL  Chief Complaint:  Fatigue, Joint Soreness, and Dizziness   Victoria Garrett is a 20 y.o.  G2P1001  at [redacted]w[redacted]d presenting for Fatigue, Joint Soreness, and Dizziness  [facility]. Reports about one week of light headedness when standing, also mild joint and body aches and feeling weak. No fever or cough. Has had covid contacts.  No vomiting or diarrhea. Also with some mild right flank pain, no dysuria or frequency or hematuria or fevers. Feeling baby move, no bleeding or LOF. No palpitations or chest pain. No dyspnea or cough. Had labs done at clinic yesterday for these symptoms. Phone number is [phone].  Past Medical History:  Diagnosis Date   Anemia    Heart murmur    as a child     Past Surgical History:  Procedure Laterality Date   NEPHROSTOMY TUBE PLACEMENT ([facility] HX)      Family History  Problem Relation Age of Onset   Healthy Mother    Healthy Father     Social History   Tobacco Use   Smoking status: Never Smoker   Smokeless tobacco: Never Used  Building services engineer Use: Never used  Substance Use Topics   Alcohol use: Not Currently   Drug use: Never    No Known Allergies  No medications prior to admission.    Review of Systems - Negative except for what is mentioned in HPI.  Physical Exam  Blood pressure (!) 117/57, pulse 92, temperature 98.7 F (37.1 C), temperature source Oral, resp. rate 20, height 5\' 2"  (1.575 m), weight 163 lb 14.4 oz (74.3 kg), SpO2 97 %. GENERAL: Well-developed, well-nourished female in no acute distress.  LUNGS: Clear to auscultation bilaterally.  HEART: Regular rate and rhythm. ABDOMEN: Soft, nontender, nondistended, gravid EXTREMITIES: Nontender, no edema, 2+ distal pulses. GU: deferred FHT:  125/mod/+a/-d Contractions: none   Labs: Results for orders placed or performed during the hospital encounter of 02/29/20 (from the past 24 hour(s))  HCV Ab Reflex to Quant PCR    Collection Time: 02/29/20  3:30 PM  Result Value Ref Range   HCV Ab <0.1 0.0 - 0.9 s/co ratio  Interpretation:   Collection Time: 02/29/20  3:30 PM  Result Value Ref Range   HCV Interp 1: Comment   SARS CORONAVIRUS 2 (TAT 6-24 HRS) Nasopharyngeal Nasopharyngeal Swab   Collection Time: 02/29/20  3:55 PM   Specimen: Nasopharyngeal Swab  Result Value Ref Range   SARS Coronavirus 2 NEGATIVE NEGATIVE  Urinalysis, Routine w reflex microscopic Urine, Clean Catch   Collection Time: 02/29/20  3:55 PM  Result Value Ref Range   Color, Urine YELLOW YELLOW   APPearance HAZY (A) CLEAR   Specific Gravity, Urine 1.012 1.005 - 1.030   pH 6.0 5.0 - 8.0   Glucose, UA NEGATIVE NEGATIVE mg/dL   Hgb urine dipstick NEGATIVE NEGATIVE   Bilirubin Urine NEGATIVE NEGATIVE   Ketones, ur NEGATIVE NEGATIVE mg/dL   Protein, ur NEGATIVE NEGATIVE mg/dL   Nitrite NEGATIVE NEGATIVE   Leukocytes,Ua TRACE (A) NEGATIVE   RBC / HPF 0-5 0 - 5 RBC/hpf   WBC, UA 0-5 0 - 5 WBC/hpf   Bacteria, UA RARE (A) NONE SEEN   Squamous Epithelial / LPF 0-5 0 - 5   Mucus PRESENT    Amorphous Crystal PRESENT   Rapid urine drug screen (hospital performed)   Collection Time: 02/29/20  3:55 PM  Result Value Ref Range   Opiates  NONE DETECTED NONE DETECTED   Cocaine NONE DETECTED NONE DETECTED   Benzodiazepines NONE DETECTED NONE DETECTED   Amphetamines NONE DETECTED NONE DETECTED   Tetrahydrocannabinol NONE DETECTED NONE DETECTED   Barbiturates NONE DETECTED NONE DETECTED    Imaging Studies:  No results found.  Assessment/Plan: Victoria Garrett is  20 y.o. G2P1001 at [redacted]w[redacted]d here with lightheadedness, body aches. Highest suspicion is for covid and swab is pending, isolation instructions given pending those results. Tolerating PO, no respiratory symptoms and O2 is normal, and fetal status is reassuring. In terms of ddx pregnancy-related hemodynamic changes are quite possible. Patient is not anemic, electrolytes are normal,  and EKG is normal. This does not sound like vertigo. Advised hydration in addition to isolation for now. Will call patient w/ results. F/u OB outpatient as scheduled for now.  Update 1/13 - covid negative, patient notified. Urine culture pending, told pt will call her if positive.    Victoria Garrett 1/13/20229:44 AM

## 2020-03-01 LAB — CERVICOVAGINAL ANCILLARY ONLY
Chlamydia: NEGATIVE
Comment: NEGATIVE
Comment: NORMAL
Neisseria Gonorrhea: NEGATIVE

## 2020-03-01 LAB — HCV INTERPRETATION

## 2020-03-01 LAB — HCV AB W REFLEX TO QUANT PCR: HCV Ab: <0.1 s/co ratio (ref 0.0–0.9)

## 2020-03-01 LAB — STREP GP B NAA: Strep Gp B NAA: NEGATIVE

## 2020-03-02 ENCOUNTER — Telehealth: Payer: Self-pay | Admitting: Obstetrics and Gynecology

## 2020-03-02 DIAGNOSIS — O2340 Unspecified infection of urinary tract in pregnancy, unspecified trimester: Secondary | ICD-10-CM | POA: Insufficient documentation

## 2020-03-02 HISTORY — DX: Unspecified infection of urinary tract in pregnancy, unspecified trimester: O23.40

## 2020-03-02 LAB — URINE CULTURE: Culture: >=100000 — AB

## 2020-03-02 MED ORDER — AMOXICILLIN 875 MG PO TABS: 875.0000 mg | ORAL_TABLET | Freq: Two times a day (BID) | ORAL | 0 refills | Status: DC

## 2020-03-02 NOTE — Telephone Encounter (Signed)
Strep viridans in urine. Called patient, some pelvic discomfort when baby kicks, no clear dysuria or change in frequency/urgency, no fevers. Will treat for bacteriuria with amoxicillin; pyelo precautions discussed.

## 2020-03-03 LAB — CULTURE, OB URINE

## 2020-03-03 LAB — URINE CULTURE, OB REFLEX

## 2020-03-05 ENCOUNTER — Encounter (HOSPITAL_COMMUNITY): Payer: Self-pay | Admitting: Obstetrics and Gynecology

## 2020-03-05 ENCOUNTER — Other Ambulatory Visit: Payer: Self-pay

## 2020-03-05 ENCOUNTER — Inpatient Hospital Stay (HOSPITAL_COMMUNITY)
Admission: AD | Admit: 2020-03-05 | Discharge: 2020-03-05 | Disposition: A | Discharge status: Home / Self Care | Payer: Medicaid Other | Attending: Obstetrics and Gynecology | Admitting: Obstetrics and Gynecology

## 2020-03-05 ENCOUNTER — Inpatient Hospital Stay (HOSPITAL_COMMUNITY): Payer: Medicaid Other

## 2020-03-05 DIAGNOSIS — O99113 Other diseases of the blood and blood-forming organs and certain disorders involving the immune mechanism complicating pregnancy, third trimester: Secondary | ICD-10-CM | POA: Insufficient documentation

## 2020-03-05 DIAGNOSIS — Z20822 Contact with and (suspected) exposure to covid-19: Secondary | ICD-10-CM | POA: Insufficient documentation

## 2020-03-05 DIAGNOSIS — R0602 Shortness of breath: Secondary | ICD-10-CM | POA: Diagnosis present

## 2020-03-05 DIAGNOSIS — F419 Anxiety disorder, unspecified: Secondary | ICD-10-CM | POA: Diagnosis not present

## 2020-03-05 DIAGNOSIS — O99891 Other specified diseases and conditions complicating pregnancy: Secondary | ICD-10-CM | POA: Diagnosis not present

## 2020-03-05 DIAGNOSIS — O26893 Other specified pregnancy related conditions, third trimester: Secondary | ICD-10-CM | POA: Diagnosis not present

## 2020-03-05 DIAGNOSIS — D6959 Other secondary thrombocytopenia: Secondary | ICD-10-CM | POA: Insufficient documentation

## 2020-03-05 DIAGNOSIS — Z3A36 36 weeks gestation of pregnancy: Secondary | ICD-10-CM | POA: Diagnosis not present

## 2020-03-05 DIAGNOSIS — O99343 Other mental disorders complicating pregnancy, third trimester: Secondary | ICD-10-CM | POA: Diagnosis not present

## 2020-03-05 DIAGNOSIS — Z3689 Encounter for other specified antenatal screening: Secondary | ICD-10-CM

## 2020-03-05 DIAGNOSIS — O2343 Unspecified infection of urinary tract in pregnancy, third trimester: Secondary | ICD-10-CM | POA: Diagnosis not present

## 2020-03-05 DIAGNOSIS — N390 Urinary tract infection, site not specified: Secondary | ICD-10-CM | POA: Insufficient documentation

## 2020-03-05 DIAGNOSIS — Z3480 Encounter for supervision of other normal pregnancy, unspecified trimester: Secondary | ICD-10-CM

## 2020-03-05 LAB — BRAIN NATRIURETIC PEPTIDE: B Natriuretic Peptide: 39.1 pg/mL (ref 0.0–100.0)

## 2020-03-05 LAB — URINALYSIS, ROUTINE W REFLEX MICROSCOPIC
Bilirubin Urine: NEGATIVE
Glucose, UA: NEGATIVE mg/dL
Hgb urine dipstick: NEGATIVE
Ketones, ur: NEGATIVE mg/dL
Nitrite: NEGATIVE
Protein, ur: NEGATIVE mg/dL
Specific Gravity, Urine: 1.016 (ref 1.005–1.030)
WBC, UA: >50 WBC/hpf — ABNORMAL HIGH (ref 0–5)
pH: 7 (ref 5.0–8.0)

## 2020-03-05 LAB — CBC WITH DIFFERENTIAL/PLATELET
Abs Immature Granulocytes: 0.09 10*3/uL — ABNORMAL HIGH (ref 0.00–0.07)
Basophils Absolute: 0 10*3/uL (ref 0.0–0.1)
Basophils Relative: 0 %
Eosinophils Absolute: 0 10*3/uL (ref 0.0–0.5)
Eosinophils Relative: 1 %
HCT: 34.3 % — ABNORMAL LOW (ref 36.0–46.0)
Hemoglobin: 11.3 g/dL — ABNORMAL LOW (ref 12.0–15.0)
Immature Granulocytes: 1 %
Lymphocytes Relative: 19 %
Lymphs Abs: 1.6 10*3/uL (ref 0.7–4.0)
MCH: 27.6 pg (ref 26.0–34.0)
MCHC: 32.9 g/dL (ref 30.0–36.0)
MCV: 83.9 fL (ref 80.0–100.0)
Monocytes Absolute: 0.6 10*3/uL (ref 0.1–1.0)
Monocytes Relative: 7 %
Neutro Abs: 6 10*3/uL (ref 1.7–7.7)
Neutrophils Relative %: 72 %
Platelets: 114 10*3/uL — ABNORMAL LOW (ref 150–400)
RBC: 4.09 MIL/uL (ref 3.87–5.11)
RDW: 12.8 % (ref 11.5–15.5)
WBC: 8.4 10*3/uL (ref 4.0–10.5)
nRBC: 0 % (ref 0.0–0.2)

## 2020-03-05 LAB — COMPREHENSIVE METABOLIC PANEL
ALT: 14 U/L (ref 0–44)
AST: 11 U/L — ABNORMAL LOW (ref 15–41)
Albumin: 2.9 g/dL — ABNORMAL LOW (ref 3.5–5.0)
Alkaline Phosphatase: 122 U/L (ref 38–126)
Anion gap: 10 (ref 5–15)
BUN: 5 mg/dL — ABNORMAL LOW (ref 6–20)
CO2: 23 mmol/L (ref 22–32)
Calcium: 9.2 mg/dL (ref 8.9–10.3)
Chloride: 104 mmol/L (ref 98–111)
Creatinine, Ser: 0.39 mg/dL — ABNORMAL LOW (ref 0.44–1.00)
GFR, Estimated: >60 mL/min (ref >60)
Glucose, Bld: 84 mg/dL (ref 70–99)
Potassium: 3.8 mmol/L (ref 3.5–5.1)
Sodium: 137 mmol/L (ref 135–145)
Total Bilirubin: 0.4 mg/dL (ref 0.3–1.2)
Total Protein: 6.3 g/dL — ABNORMAL LOW (ref 6.5–8.1)

## 2020-03-05 LAB — RESP PANEL BY RT-PCR (FLU A&B, COVID) ARPGX2
Influenza A by PCR: NEGATIVE
Influenza B by PCR: NEGATIVE
SARS Coronavirus 2 by RT PCR: NEGATIVE

## 2020-03-05 LAB — TROPONIN I (HIGH SENSITIVITY): Troponin I (High Sensitivity): 6 ng/L (ref <18)

## 2020-03-05 NOTE — MAU Note (Signed)
Pt here last week. Covid test negative.  Stated  She is still feeling chest pain  (that gets worse when she lays down) and SOB. Also c/o feeling lightheaded like she may pass out. Reports she is having intermittent numbness in her left leg (it goes to sleep). Good fetal movement felt.

## 2020-03-05 NOTE — MAU Provider Note (Addendum)
History     CSN: [id number]  Arrival date and time: 03/05/20 1335   Chief Complaint  Patient presents with  . Shortness of Breath   19 y.o. G2P1001 @36 .6 wks presenting with body aches, dizziness, SOB, and CP. States sx have been ongoing for about 2 weeks. SOB occurs when she is up doing tasks and when she lies down at night. She also feels chest pressure when she lies down, feels like "my 20 yr old is standing on my chest". Denies fever, cough, HA, congestion, and sore throat. Denies Covid exposure however she lives at [facility]. Has not had Covid vaccination. Reports good FM. No pregnancy complaints. States she is eating and drinking well but had not had any water today.   OB History    Gravida  2   Para  1   Term  1   Preterm      AB      Living  1     SAB      IAB      Ectopic      Multiple      Live Births  1           Past Medical History:  Diagnosis Date  . Anemia   . Heart murmur    as a child     Past Surgical History:  Procedure Laterality Date  . NEPHROSTOMY TUBE PLACEMENT ([facility] HX)      Family History  Problem Relation Age of Onset  . Healthy Mother   . Healthy Father     Social History   Tobacco Use  . Smoking status: Never Smoker  . Smokeless tobacco: Never Used  Vaping Use  . Vaping Use: Never used  Substance Use Topics  . Alcohol use: Not Currently  . Drug use: Never    Allergies: No Known Allergies  Medications Prior to Admission  Medication Sig Dispense Refill Last Dose  . amoxicillin (AMOXIL) 875 MG tablet Take 1 tablet (875 mg total) by mouth 2 (two) times daily. 10 tablet 0   . famotidine (PEPCID) 20 MG tablet Take 1 tablet (20 mg total) by mouth 2 (two) times daily. 60 tablet 1   . hydrOXYzine (ATARAX/VISTARIL) 25 MG tablet Take 1 tablet (25 mg total) by mouth every 8 (eight) hours as needed for anxiety. (Patient not taking: Reported on 02/14/2020) 30 tablet 0   . ondansetron (ZOFRAN-ODT) 4 MG disintegrating  tablet Take 4 mg by mouth daily as needed for nausea/vomiting. (Patient not taking: Reported on 02/14/2020)       Review of Systems  HENT: Negative for congestion and sore throat.   Respiratory: Positive for shortness of breath. Negative for cough.   Cardiovascular: Positive for chest pain.  Gastrointestinal: Negative for abdominal pain.  Genitourinary: Negative for vaginal bleeding.  Musculoskeletal: Positive for myalgias.  Neurological: Positive for dizziness. Negative for headaches.   Physical Exam   Blood pressure 116/65, pulse (!) 102, temperature 98.4 F (36.9 C), SpO2 98 %.  Physical Exam Vitals and nursing note reviewed. Exam conducted with a chaperone present.  Constitutional:      General: She is not in acute distress.    Appearance: Normal appearance.  HENT:     Head: Normocephalic and atraumatic.  Cardiovascular:     Rate and Rhythm: Normal rate and regular rhythm.     Heart sounds: Normal heart sounds.  Pulmonary:     Effort: Pulmonary effort is normal. No respiratory distress.  Breath sounds: Normal breath sounds. No stridor. No wheezing, rhonchi or rales.  Musculoskeletal:        General: Normal range of motion.     Cervical back: Normal range of motion.  Skin:    General: Skin is warm and dry.  Neurological:     General: No focal deficit present.     Mental Status: She is alert and oriented to person, place, and time.  Psychiatric:        Mood and Affect: Mood normal.        Behavior: Behavior normal.   EFM: 135 bpm, mod variability, + accels, no decels Toco: irreg  Results for orders placed or performed during the hospital encounter of 03/05/20 (from the past 24 hour(s))  Brain natriuretic peptide     Status: None   Collection Time: 03/05/20  2:31 PM  Result Value Ref Range   B Natriuretic Peptide 39.1 0.0 - 100.0 pg/mL  CBC with Differential/Platelet     Status: Abnormal   Collection Time: 03/05/20  2:31 PM  Result Value Ref Range   WBC 8.4  4.0 - 10.5 K/uL   RBC 4.09 3.87 - 5.11 MIL/uL   Hemoglobin 11.3 (L) 12.0 - 15.0 g/dL   HCT 70.0 (L) 17.4 - 94.4 %   MCV 83.9 80.0 - 100.0 fL   MCH 27.6 26.0 - 34.0 pg   MCHC 32.9 30.0 - 36.0 g/dL   RDW 96.7 59.1 - 63.8 %   Platelets 114 (L) 150 - 400 K/uL   nRBC 0.0 0.0 - 0.2 %   Neutrophils Relative % 72 %   Neutro Abs 6.0 1.7 - 7.7 K/uL   Lymphocytes Relative 19 %   Lymphs Abs 1.6 0.7 - 4.0 K/uL   Monocytes Relative 7 %   Monocytes Absolute 0.6 0.1 - 1.0 K/uL   Eosinophils Relative 1 %   Eosinophils Absolute 0.0 0.0 - 0.5 K/uL   Basophils Relative 0 %   Basophils Absolute 0.0 0.0 - 0.1 K/uL   Immature Granulocytes 1 %   Abs Immature Granulocytes 0.09 (H) 0.00 - 0.07 K/uL  Comprehensive metabolic panel     Status: Abnormal   Collection Time: 03/05/20  2:31 PM  Result Value Ref Range   Sodium 137 135 - 145 mmol/L   Potassium 3.8 3.5 - 5.1 mmol/L   Chloride 104 98 - 111 mmol/L   CO2 23 22 - 32 mmol/L   Glucose, Bld 84 70 - 99 mg/dL   BUN 5 (L) 6 - 20 mg/dL   Creatinine, Ser 4.66 (L) 0.44 - 1.00 mg/dL   Calcium 9.2 8.9 - 59.9 mg/dL   Total Protein 6.3 (L) 6.5 - 8.1 g/dL   Albumin 2.9 (L) 3.5 - 5.0 g/dL   AST 11 (L) 15 - 41 U/L   ALT 14 0 - 44 U/L   Alkaline Phosphatase 122 38 - 126 U/L   Total Bilirubin 0.4 0.3 - 1.2 mg/dL   GFR, Estimated >35 >70 mL/min   Anion gap 10 5 - 15  Troponin I (High Sensitivity)     Status: None   Collection Time: 03/05/20  2:31 PM  Result Value Ref Range   Troponin I (High Sensitivity) 6 <18 ng/L  Urinalysis, Routine w reflex microscopic Nasopharyngeal Swab     Status: Abnormal   Collection Time: 03/05/20  3:17 PM  Result Value Ref Range   Color, Urine YELLOW YELLOW   APPearance CLOUDY (A) CLEAR   Specific Gravity, Urine 1.016  1.005 - 1.030   pH 7.0 5.0 - 8.0   Glucose, UA NEGATIVE NEGATIVE mg/dL   Hgb urine dipstick NEGATIVE NEGATIVE   Bilirubin Urine NEGATIVE NEGATIVE   Ketones, ur NEGATIVE NEGATIVE mg/dL   Protein, ur NEGATIVE  NEGATIVE mg/dL   Nitrite NEGATIVE NEGATIVE   Leukocytes,Ua SMALL (A) NEGATIVE   RBC / HPF 0-5 0 - 5 RBC/hpf   WBC, UA >50 (H) 0 - 5 WBC/hpf   Bacteria, UA RARE (A) NONE SEEN   Squamous Epithelial / LPF 0-5 0 - 5   Mucus PRESENT   Resp Panel by RT-PCR (Flu A&B, Covid) Nasopharyngeal Swab     Status: None   Collection Time: 03/05/20  3:18 PM   Specimen: Nasopharyngeal Swab; Nasopharyngeal(NP) swabs in vial transport medium  Result Value Ref Range   SARS Coronavirus 2 by RT PCR NEGATIVE NEGATIVE   Influenza A by PCR NEGATIVE NEGATIVE   Influenza B by PCR NEGATIVE NEGATIVE   DG Chest Port 1 View  Result Date: 03/05/2020 CLINICAL DATA:  Shortness of breath EXAM: PORTABLE CHEST 1 VIEW COMPARISON:  None. FINDINGS: The heart size and mediastinal contours are within normal limits. Both lungs are clear. The visualized skeletal structures are unremarkable. IMPRESSION: No active disease. Electronically Signed   By: Alcide Clever M.D.   On: 03/05/2020 15:09   MAU Course  Procedures  MDM Pregnancy is complicated by anxiety, gestational thrombocytopenia, and UTI. Labs ordered and reviewed. EKG NSR, read by Adrian Blackwater, MD. No signs of Covid or emergent condition. SOB likely physiologic to advancing pregnancy. Recommend regular meals and good hydration. She was given an antibiotic for a UTI last week but hasn't gone to pick it up yet, encouraged to get it and start asap. Stable for discharge home.  Assessment and Plan   1. [redacted] weeks gestation of pregnancy   2. Supervision of other normal pregnancy, antepartum   3. SOB (shortness of breath)   4. NST (non-stress test) reactive   5. Urinary tract infection in mother during third trimester of pregnancy    Discharge home Follow up at [facility] tomorrow as scheduled Return precautions  Allergies as of 03/05/2020   No Known Allergies     Medication List    TAKE these medications   amoxicillin 875 MG tablet Commonly known as: AMOXIL Take 1 tablet (875  mg total) by mouth 2 (two) times daily.   famotidine 20 MG tablet Commonly known as: PEPCID Take 1 tablet (20 mg total) by mouth 2 (two) times daily.   hydrOXYzine 25 MG tablet Commonly known as: ATARAX/VISTARIL Take 1 tablet (25 mg total) by mouth every 8 (eight) hours as needed for anxiety.   ondansetron 4 MG disintegrating tablet Commonly known as: ZOFRAN-ODT Take 4 mg by mouth daily as needed for nausea/vomiting.      Donette Larry, CNM 03/05/2020, 5:14 PM

## 2020-03-05 NOTE — Discharge Instructions (Signed)
Pregnancy and Urinary Tract Infection  A urinary tract infection (UTI) is an infection of any part of the urinary tract. This includes the kidneys, the tubes that connect your kidneys to your bladder (ureters), the bladder, and the tube that carries urine out of your body (urethra). These organs make, store, and get rid of urine in the body. Your health care provider may use other names to describe the infection. An upper UTI affects the ureters and kidneys (pyelonephritis). A lower UTI affects the bladder (cystitis) and urethra (urethritis). Most urinary tract infections are caused by bacteria in your genital area, around the entrance to your urinary tract (urethra). These bacteria grow and cause irritation and inflammation of your urinary tract. You are more likely to develop a UTI during pregnancy because the physical and hormonal changes your body goes through can make it easier for bacteria to get into your urinary tract. Your growing baby also puts pressure on your bladder and can affect urine flow. It is important to recognize and treat UTIs in pregnancy because of the risk of serious complications for both you and your baby. How does this affect me? Symptoms of a UTI include:  Needing to urinate right away (urgently).  Frequent urination or passing small amounts of urine frequently.  Pain or burning with urination.  Blood in the urine.  Urine that smells bad or unusual.  Trouble urinating.  Cloudy urine.  Pain in the abdomen or lower back.  Vaginal discharge. You may also have:  Vomiting or a decreased appetite.  Confusion.  Irritability or tiredness.  A fever.  Diarrhea. How does this affect my baby? An untreated UTI during pregnancy could lead to a kidney infection or a systemic infection, which can cause health problems that could affect your baby. Possible complications of an untreated UTI include:  Giving birth to your baby before 37 weeks of pregnancy  (premature).  Having a baby with a low birth weight.  Developing high blood pressure during pregnancy (preeclampsia).  Having a low hemoglobin level (anemia). What can I do to lower my risk? To prevent a UTI:  Go to the bathroom as soon as you feel the need. Do not hold urine for long periods of time.  Always wipe from front to back, especially after a bowel movement. Use each tissue one time when you wipe.  Empty your bladder after sex.  Keep your genital area dry.  Drink 6-10 glasses of water each day.  Do not douche or use deodorant sprays. How is this treated? Treatment for this condition may include:  Antibiotic medicines that are safe to take during pregnancy.  Other medicines to treat less common causes of UTI. Follow these instructions at home:  If you were prescribed an antibiotic medicine, take it as told by your health care provider. Do not stop using the antibiotic even if you start to feel better.  Keep all follow-up visits as told by your health care provider. This is important. Contact a health care provider if:  Your symptoms do not improve or they get worse.  You have abnormal vaginal discharge. Get help right away if you:  Have a fever.  Have nausea and vomiting.  Have back or side pain.  Feel contractions in your uterus.  Have lower belly pain.  Have a gush of fluid from your vagina.  Have blood in your urine. Summary  A urinary tract infection (UTI) is an infection of any part of the urinary tract, which includes the   kidneys, ureters, bladder, and urethra.  Most urinary tract infections are caused by bacteria in your genital area, around the entrance to your urinary tract (urethra).  You are more likely to develop a UTI during pregnancy.  If you were prescribed an antibiotic medicine, take it as told by your health care provider. Do not stop using the antibiotic even if you start to feel better. This information is not intended to  replace advice given to you by your health care provider. Make sure you discuss any questions you have with your health care provider. Document Revised: 05/28/2018 Document Reviewed: 01/07/2018 Elsevier Patient Education  2021 [employer]vier Inc.  

## 2020-03-06 ENCOUNTER — Ambulatory Visit (INDEPENDENT_AMBULATORY_CARE_PROVIDER_SITE_OTHER): Payer: Medicaid Other | Admitting: Nurse Practitioner

## 2020-03-06 VITALS — BP 126/71 | HR 91 | Wt 164.0 lb

## 2020-03-06 DIAGNOSIS — D696 Thrombocytopenia, unspecified: Secondary | ICD-10-CM

## 2020-03-06 DIAGNOSIS — O2343 Unspecified infection of urinary tract in pregnancy, third trimester: Secondary | ICD-10-CM

## 2020-03-06 DIAGNOSIS — Z3A37 37 weeks gestation of pregnancy: Secondary | ICD-10-CM

## 2020-03-06 DIAGNOSIS — Z3480 Encounter for supervision of other normal pregnancy, unspecified trimester: Secondary | ICD-10-CM

## 2020-03-06 NOTE — Progress Notes (Signed)
    Subjective:  Victoria Garrett is a 20 y.o. G2P1001 at [redacted]w[redacted]d being seen today for ongoing prenatal care.  She is currently monitored for the following issues for this low-risk pregnancy and has Supervision of other normal pregnancy, antepartum; Generalized anxiety disorder with panic attacks; Major depressive disorder affecting pregnancy; Poor social situation; History of pyelonephritis during pregnancy; Benign gestational thrombocytopenia in third trimester (HCC); Preterm uterine contractions in third trimester, antepartum; and UTI in pregnancy on their problem list. Brought paperwork from [facility] for the provider to fill out.  Patient reports no further complaints - does have achiness all over - seen yesterday in MAU.  Has not yet gotten medication from pharmacy originally prescribed on 02-20-20.[name]  Contractions: Not present. Vag. Bleeding: None.  Movement: Present. Denies leaking of fluid.   The following portions of the patient's history were reviewed and updated as appropriate: allergies, current medications, past family history, past medical history, past social history, past surgical history and problem list. Problem list updated.  Objective:   Vitals:   03/06/20 1028  BP: 126/71  Pulse: 91  Weight: 164 lb (74.4 kg)    Fetal Status: Fetal Heart Rate (bpm): 150 Fundal Height: 39 cm Movement: Present     General:  Alert, oriented and cooperative. Patient is in no acute distress.  Skin: Skin is warm and dry. No rash noted.   Cardiovascular: Normal heart rate noted  Respiratory: Normal respiratory effort, no problems with respiration noted  Abdomen: Soft, gravid, appropriate for gestational age. Pain/Pressure: Present     Pelvic:  Cervical exam deferred        Extremities: Normal range of motion.  Edema: None  Mental Status: Normal mood and affect. Normal behavior. Normal judgment and thought content.   Urinalysis:      Assessment and Plan:  Pregnancy: G2P1001 at  [redacted]w[redacted]d  1. Supervision of other normal pregnancy, antepartum Seen yesterday in MAU  2. Thrombocytopenia (HCC) Noted on chart  3. UTI (urinary tract infection) during pregnancy, third trimester Plans to have [facility] staff help her get the medication prescribed.  Advised it may help her body aches to take the antibiotics as the UTI may be worsening body aches  No fever   Term labor symptoms and general obstetric precautions including but not limited to vaginal bleeding, contractions, leaking of fluid and fetal movement were reviewed in detail with the patient. Please refer to After Visit Summary for other counseling recommendations.  Return in about 1 week (around 03/13/2020) for virtual or inperson visit.  Nolene Bernheim, RN, MSN, NP-BC Nurse Practitioner, [facility] for [employer], [facility] 03/06/2020 1:00 PM

## 2020-03-06 NOTE — Patient Instructions (Signed)

## 2020-03-06 NOTE — Progress Notes (Signed)
ROB, reports no problems today. 

## 2020-03-13 ENCOUNTER — Ambulatory Visit (INDEPENDENT_AMBULATORY_CARE_PROVIDER_SITE_OTHER): Payer: Medicaid Other | Admitting: Advanced Practice Midwife

## 2020-03-13 ENCOUNTER — Other Ambulatory Visit: Payer: Self-pay

## 2020-03-13 ENCOUNTER — Other Ambulatory Visit (HOSPITAL_COMMUNITY)
Admission: RE | Admit: 2020-03-13 | Discharge: 2020-03-13 | Disposition: A | Discharge status: Home / Self Care | Payer: Medicaid Other | Source: Ambulatory Visit | Attending: Advanced Practice Midwife | Admitting: Advanced Practice Midwife

## 2020-03-13 VITALS — BP 118/73 | HR 98 | Wt 167.1 lb

## 2020-03-13 DIAGNOSIS — O26893 Other specified pregnancy related conditions, third trimester: Secondary | ICD-10-CM

## 2020-03-13 DIAGNOSIS — Z3480 Encounter for supervision of other normal pregnancy, unspecified trimester: Secondary | ICD-10-CM

## 2020-03-13 DIAGNOSIS — N898 Other specified noninflammatory disorders of vagina: Secondary | ICD-10-CM

## 2020-03-13 DIAGNOSIS — O99113 Other diseases of the blood and blood-forming organs and certain disorders involving the immune mechanism complicating pregnancy, third trimester: Secondary | ICD-10-CM

## 2020-03-13 DIAGNOSIS — Z3A38 38 weeks gestation of pregnancy: Secondary | ICD-10-CM

## 2020-03-13 DIAGNOSIS — D696 Thrombocytopenia, unspecified: Secondary | ICD-10-CM

## 2020-03-13 DIAGNOSIS — M5431 Sciatica, right side: Secondary | ICD-10-CM

## 2020-03-13 NOTE — Progress Notes (Addendum)
   PRENATAL VISIT NOTE  Subjective:  ELEANORA GUINYARD is a 20 y.o. G2P1001 at [redacted]w[redacted]d being seen today for ongoing prenatal care.  She is currently monitored for the following issues for this low-risk pregnancy and has Supervision of other normal pregnancy, antepartum; Generalized anxiety disorder with panic attacks; Major depressive disorder affecting pregnancy; Poor social situation; History of pyelonephritis during pregnancy; Benign gestational thrombocytopenia in third trimester (HCC); Preterm uterine contractions in third trimester, antepartum; UTI in pregnancy; Hydronephrosis, right; and PTSD (post-traumatic stress disorder) on their problem list.  Patient reports occasional contractions.  Contractions: Irregular. Vag. Bleeding: None.  Movement: Present. Denies leaking of fluid.   The following portions of the patient's history were reviewed and updated as appropriate: allergies, current medications, past family history, past medical history, past social history, past surgical history and problem list.   Objective:   Vitals:   03/13/20 1114  BP: 118/73  Pulse: 98  Weight: 167 lb 1.6 oz (75.8 kg)    Fetal Status: Fetal Heart Rate (bpm): 145 Fundal Height: 38 cm Movement: Present  Presentation: Vertex  General:  Alert, oriented and cooperative. Patient is in no acute distress.  Skin: Skin is warm and dry. No rash noted.   Cardiovascular: Normal heart rate noted  Respiratory: Normal respiratory effort, no problems with respiration noted  Abdomen: Soft, gravid, appropriate for gestational age.  Pain/Pressure: Present     Pelvic: Cervical exam performed in the presence of a chaperone Dilation: 1 Effacement (%): 40 Station: -1  Extremities: Normal range of motion.  Edema: None  Mental Status: Normal mood and affect. Normal behavior. Normal judgment and thought content.   Assessment and Plan:  Pregnancy: G2P1001 at [redacted]w[redacted]d 1. Supervision of other normal pregnancy,  antepartum --Anticipatory guidance about next visits/weeks of pregnancy given. --next visit in 1 week in office --Pt desires IOL at 40 weeks, cervix soft 1/40% today in office --Labor readiness/labor precautions reviewed  2. Vaginal discharge during pregnancy in third trimester --Vaginal itching and discharge since she took abx for UTI - Cervicovaginal ancillary only( [place])  3. [redacted] weeks gestation of pregnancy   4. Benign gestational thrombocytopenia in third trimester (HCC) --Plts 135 down to 107 at 36 weeks  5. Sciatica of right side without back pain --Exercises given, try ice instead of heat, pregnancy support belt  Term labor symptoms and general obstetric precautions including but not limited to vaginal bleeding, contractions, leaking of fluid and fetal movement were reviewed in detail with the patient. Please refer to After Visit Summary for other counseling recommendations.   Return in about 1 week (around 03/20/2020).  No future appointments.  Sharen Counter, CNM

## 2020-03-13 NOTE — Progress Notes (Signed)
+   Fetal movement. Pt c/o possible yeast infection. Also states her right leg "is acting funny."

## 2020-03-13 NOTE — Patient Instructions (Addendum)
Things to Try After 37 weeks to Encourage Labor/Get Ready for Labor:   1.  Try the [employer] at https://glass.com/.com daily to improve baby's position and encourage the onset of labor.  2. Walk a little and rest a little every day.  Change positions often.  3. Cervical Ripening: May try one or both a. Red Raspberry Leaf capsules or tea:  two 300mg  or 400mg  tablets with each meal, 2-3 times a day, or 1-3 cups of tea daily  Potential Side Effects Of Raspberry Leaf:  Most women do not experience any side effects from drinking raspberry leaf tea. However, nausea and loose stools are possible   b. Evening Primrose Oil capsules: take 1 capsule by mouth and place one capsule in the vagina every night.    Some of the potential side effects:  Upset stomach  Loose stools or diarrhea  Headaches  Nausea  4. Sex can also help the cervix ripen and encourage labor onset.    Labor Precautions Reasons to come to [facility]U at [facility]:  1.  Contractions are  5 minutes apart or less, each last 1 minute, these have been going on for 1-2 hours, and you cannot walk or talk during them 2.  You have a large gush of fluid, or a trickle of fluid that will not stop and you have to wear a pad 3.  You have bleeding that is bright red, heavier than spotting--like menstrual bleeding (spotting can be normal in early labor or after a check of your cervix) 4.  You do not feel the baby moving like he/she normally does   Sciatica  Sciatica is pain, numbness, weakness, or tingling along the path of the sciatic nerve. The sciatic nerve starts in the lower back and runs down the back of each leg. The nerve controls the muscles in the lower leg and in the back of the knee. It also provides feeling (sensation) to the back of the thigh, the lower leg, and the sole of the foot. Sciatica is a symptom of another medical condition that pinches or puts pressure on the sciatic nerve. Sciatica most  often only affects one side of the body. Sciatica usually goes away on its own or with treatment. In some cases, sciatica may come back (recur). What are the causes? This condition is caused by pressure on the sciatic nerve or pinching of the nerve. This may be the result of:  A disk in between the bones of the spine bulging out too far (herniated disk).  Age-related changes in the spinal disks.  A pain disorder that affects a muscle in the buttock.  Extra bone growth near the sciatic nerve.  A break (fracture) of the pelvis.  Pregnancy.  Tumor. This is rare. What increases the risk? The following factors may make you more likely to develop this condition:  Playing sports that place pressure or stress on the spine.  Having poor strength and flexibility.  A history of back injury or surgery.  Sitting for long periods of time.  Doing activities that involve repetitive bending or lifting.  Obesity. What are the signs or symptoms? Symptoms can vary from mild to very severe, and they may include:  Any of these problems in the lower back, leg, hip, or buttock: ? Mild tingling, numbness, or dull aches. ? Burning sensations. ? Sharp pains.  Numbness in the back of the calf or the sole of the foot.  Leg weakness.  Severe back  pain that makes movement difficult. Symptoms may get worse when you cough, sneeze, or laugh, or when you sit or stand for long periods of time. How is this diagnosed? This condition may be diagnosed based on:  Your symptoms and medical history.  A physical exam.  Blood tests.  Imaging tests, such as: ? X-rays. ? MRI. ? CT scan. How is this treated? In many cases, this condition improves on its own without treatment. However, treatment may include:  Reducing or modifying physical activity.  Exercising and stretching.  Icing and applying heat to the affected area.  Medicines that help to: ? Relieve pain and swelling. ? Relax your  muscles.  Injections of medicines that help to relieve pain, irritation, and inflammation around the sciatic nerve (steroids).  Surgery. Follow these instructions at home: Medicines  Take over-the-counter and prescription medicines only as told by your health care provider.  Ask your health care provider if the medicine prescribed to you: ? Requires you to avoid driving or using heavy machinery. ? Can cause constipation. You may need to take these actions to prevent or treat constipation:  Drink enough fluid to keep your urine pale yellow.  Take over-the-counter or prescription medicines.  Eat foods that are high in fiber, such as beans, whole grains, and fresh fruits and vegetables.  Limit foods that are high in fat and processed sugars, such as fried or sweet foods. Managing pain  If directed, put ice on the affected area. ? Put ice in a plastic bag. ? Place a towel between your skin and the bag. ? Leave the ice on for 20 minutes, 2-3 times a day.  If directed, apply heat to the affected area. Use the heat source that your health care provider recommends, such as a moist heat pack or a heating pad. ? Place a towel between your skin and the heat source. ? Leave the heat on for 20-30 minutes. ? Remove the heat if your skin turns bright red. This is especially important if you are unable to feel pain, heat, or cold. You may have a greater risk of getting burned.      Activity  Return to your normal activities as told by your health care provider. Ask your health care provider what activities are safe for you.  Avoid activities that make your symptoms worse.  Take brief periods of rest throughout the day. ? When you rest for longer periods, mix in some mild activity or stretching between periods of rest. This will help to prevent stiffness and pain. ? Avoid sitting for long periods of time without moving. Get up and move around at least one time each hour.  Exercise and  stretch regularly, as told by your health care provider.  Do not lift anything that is heavier than 10 lb (4.5 kg) while you have symptoms of sciatica. When you do not have symptoms, you should still avoid heavy lifting, especially repetitive heavy lifting.  When you lift objects, always use proper lifting technique, which includes: ? Bending your knees. ? Keeping the load close to your body. ? Avoiding twisting.   General instructions  Maintain a healthy weight. Excess weight puts extra stress on your back.  Wear supportive, comfortable shoes. Avoid wearing high heels.  Avoid sleeping on a mattress that is too soft or too hard. A mattress that is firm enough to support your back when you sleep may help to reduce your pain.  Keep all follow-up visits as told by your  health care provider. This is important. Contact a health care provider if:  You have pain that: ? Wakes you up when you are sleeping. ? Gets worse when you lie down. ? Is worse than you have experienced in the past. ? Lasts longer than 4 weeks.  You have an unexplained weight loss. Get help right away if:  You are not able to control when you urinate or have bowel movements (incontinence).  You have: ? Weakness in your lower back, pelvis, buttocks, or legs that gets worse. ? Redness or swelling of your back. ? A burning sensation when you urinate. Summary  Sciatica is pain, numbness, weakness, or tingling along the path of the sciatic nerve.  This condition is caused by pressure on the sciatic nerve or pinching of the nerve.  Sciatica can cause pain, numbness, or tingling in the lower back, legs, hips, and buttocks.  Treatment often includes rest, exercise, medicines, and applying ice or heat. This information is not intended to replace advice given to you by your health care provider. Make sure you discuss any questions you have with your health care provider. Document Revised: 02/22/2018 Document Reviewed:  02/22/2018 Elsevier Patient Education  2021 Elsevier Inc.    Sciatica Rehab Ask your health care provider which exercises are safe for you. Do exercises exactly as told by your health care provider and adjust them as directed. It is normal to feel mild stretching, pulling, tightness, or discomfort as you do these exercises. Stop right away if you feel sudden pain or your pain gets worse. Do not begin these exercises until told by your health care provider. Stretching and range-of-motion exercises These exercises warm up your muscles and joints and improve the movement and flexibility of your hips and back. These exercises also help to relieve pain, numbness, and tingling. Sciatic nerve glide 1. Sit in a chair with your head facing down toward your chest. Place your hands behind your back. Let your shoulders slump forward. 2. Slowly straighten one of your legs while you tilt your head back as if you are looking toward the ceiling. Only straighten your leg as far as you can without making your symptoms worse. 3. Hold this position for __________ seconds. 4. Slowly return to the starting position. 5. Repeat with your other leg. Repeat __________ times. Complete this exercise __________ times a day. Knee to chest with hip adduction and internal rotation 1. Lie on your back on a firm surface with both legs straight. 2. Bend one of your knees and move it up toward your chest until you feel a gentle stretch in your lower back and buttock. Then, move your knee toward the shoulder that is on the opposite side from your leg. This is hip adduction and internal rotation. ? Hold your leg in this position by holding on to the front of your knee. 3. Hold this position for __________ seconds. 4. Slowly return to the starting position. 5. Repeat with your other leg. Repeat __________ times. Complete this exercise __________ times a day.   Prone extension on elbows 1. Lie on your abdomen on a firm surface. A  bed may be too soft for this exercise. 2. Prop yourself up on your elbows. 3. Use your arms to help lift your chest up until you feel a gentle stretch in your abdomen and your lower back. ? This will place some of your body weight on your elbows. If this is uncomfortable, try stacking pillows under your chest. ? Your  hips should stay down, against the surface that you are lying on. Keep your hip and back muscles relaxed. 4. Hold this position for __________ seconds. 5. Slowly relax your upper body and return to the starting position. Repeat __________ times. Complete this exercise __________ times a day.   Strengthening exercises These exercises build strength and endurance in your back. Endurance is the ability to use your muscles for a long time, even after they get tired. Pelvic tilt This exercise strengthens the muscles that lie deep in the abdomen. 1. Lie on your back on a firm surface. Bend your knees and keep your feet flat on the floor. 2. Tense your abdominal muscles. Tip your pelvis up toward the ceiling and flatten your lower back into the floor. ? To help with this exercise, you may place a small towel under your lower back and try to push your back into the towel. 3. Hold this position for __________ seconds. 4. Let your muscles relax completely before you repeat this exercise. Repeat __________ times. Complete this exercise __________ times a day. Alternating arm and leg raises 1. Get on your hands and knees on a firm surface. If you are on a hard floor, you may want to use padding, such as an exercise mat, to cushion your knees. 2. Line up your arms and legs. Your hands should be directly below your shoulders, and your knees should be directly below your hips. 3. Lift your left leg behind you. At the same time, raise your right arm and straighten it in front of you. ? Do not lift your leg higher than your hip. ? Do not lift your arm higher than your shoulder. ? Keep your  abdominal and back muscles tight. ? Keep your hips facing the ground. ? Do not arch your back. ? Keep your balance carefully, and do not hold your breath. 4. Hold this position for __________ seconds. 5. Slowly return to the starting position. 6. Repeat with your right leg and your left arm. Repeat __________ times. Complete this exercise __________ times a day.   Posture and body mechanics Good posture and healthy body mechanics can help to relieve stress in your body's tissues and joints. Body mechanics refers to the movements and positions of your body while you do your daily activities. Posture is part of body mechanics. Good posture means:  Your spine is in its natural S-curve position (neutral).  Your shoulders are pulled back slightly.  Your head is not tipped forward. Follow these guidelines to improve your posture and body mechanics in your everyday activities. Standing  When standing, keep your spine neutral and your feet about hip width apart. Keep a slight bend in your knees. Your ears, shoulders, and hips should line up.  When you do a task in which you stand in one place for a long time, place one foot up on a stable object that is 2-4 inches (5-10 cm) high, such as a footstool. This helps keep your spine neutral.   Sitting  When sitting, keep your spine neutral and keep your feet flat on the floor. Use a footrest, if necessary, and keep your thighs parallel to the floor. Avoid rounding your shoulders, and avoid tilting your head forward.  When working at a desk or a computer, keep your desk at a height where your hands are slightly lower than your elbows. Slide your chair under your desk so you are close enough to maintain good posture.  When working at a computer,  place your monitor at a height where you are looking straight ahead and you do not have to tilt your head forward or downward to look at the screen.   Resting  When lying down and resting, avoid positions that  are most painful for you.  If you have pain with activities such as sitting, bending, stooping, or squatting, lie in a position in which your body does not bend very much. For example, avoid curling up on your side with your arms and knees near your chest (fetal position).  If you have pain with activities such as standing for a long time or reaching with your arms, lie with your spine in a neutral position and bend your knees slightly. Try the following positions: ? Lying on your side with a pillow between your knees. ? Lying on your back with a pillow under your knees. Lifting  When lifting objects, keep your feet at least shoulder width apart and tighten your abdominal muscles.  Bend your knees and hips and keep your spine neutral. It is important to lift using the strength of your legs, not your back. Do not lock your knees straight out.  Always ask for help to lift heavy or awkward objects.   This information is not intended to replace advice given to you by your health care provider. Make sure you discuss any questions you have with your health care provider. Document Revised: 05/28/2018 Document Reviewed: 02/25/2018 Elsevier Patient Education  2021 [employer].

## 2020-03-14 LAB — CERVICOVAGINAL ANCILLARY ONLY
Bacterial Vaginitis (gardnerella): NEGATIVE
Candida Glabrata: NEGATIVE
Candida Vaginitis: POSITIVE — AB
Chlamydia: NEGATIVE
Comment: NEGATIVE
Comment: NEGATIVE
Comment: NEGATIVE
Comment: NEGATIVE
Comment: NEGATIVE
Comment: NORMAL
Neisseria Gonorrhea: NEGATIVE
Trichomonas: NEGATIVE

## 2020-03-15 ENCOUNTER — Other Ambulatory Visit: Payer: Self-pay

## 2020-03-15 MED ORDER — FLUCONAZOLE 150 MG PO TABS: ORAL_TABLET | ORAL | 0 refills | Status: DC

## 2020-03-15 NOTE — Telephone Encounter (Signed)
Refill on medication

## 2020-03-20 ENCOUNTER — Ambulatory Visit (INDEPENDENT_AMBULATORY_CARE_PROVIDER_SITE_OTHER): Payer: Medicaid Other | Admitting: Advanced Practice Midwife

## 2020-03-20 ENCOUNTER — Other Ambulatory Visit: Payer: Self-pay | Admitting: Advanced Practice Midwife

## 2020-03-20 ENCOUNTER — Other Ambulatory Visit: Payer: Self-pay

## 2020-03-20 VITALS — BP 117/78 | HR 89 | Wt 167.0 lb

## 2020-03-20 DIAGNOSIS — Z3A39 39 weeks gestation of pregnancy: Secondary | ICD-10-CM | POA: Diagnosis not present

## 2020-03-20 DIAGNOSIS — Z7289 Other problems related to lifestyle: Secondary | ICD-10-CM | POA: Insufficient documentation

## 2020-03-20 DIAGNOSIS — Z3480 Encounter for supervision of other normal pregnancy, unspecified trimester: Secondary | ICD-10-CM

## 2020-03-20 DIAGNOSIS — F329 Major depressive disorder, single episode, unspecified: Secondary | ICD-10-CM

## 2020-03-20 DIAGNOSIS — O99113 Other diseases of the blood and blood-forming organs and certain disorders involving the immune mechanism complicating pregnancy, third trimester: Secondary | ICD-10-CM

## 2020-03-20 DIAGNOSIS — O99340 Other mental disorders complicating pregnancy, unspecified trimester: Secondary | ICD-10-CM | POA: Diagnosis not present

## 2020-03-20 DIAGNOSIS — D696 Thrombocytopenia, unspecified: Secondary | ICD-10-CM

## 2020-03-20 HISTORY — DX: Other problems related to lifestyle: Z72.89

## 2020-03-20 NOTE — Progress Notes (Signed)
   PRENATAL VISIT NOTE  Subjective:  Victoria Garrett is a 20 y.o. G2P1001 at [redacted]w[redacted]d being seen today for ongoing prenatal care.  She is currently monitored for the following issues for this low-risk pregnancy and has Supervision of other normal pregnancy, antepartum; Generalized anxiety disorder with panic attacks; Major depressive disorder affecting pregnancy; Poor social situation; History of pyelonephritis during pregnancy; Benign gestational thrombocytopenia in third trimester (HCC); Preterm uterine contractions in third trimester, antepartum; UTI in pregnancy; Hydronephrosis, right; PTSD (post-traumatic stress disorder); and Deliberate self-cutting on their problem list.  Patient reports no complaints. She requests elective IOL at 40.0 Contractions: Irregular. Vag. Bleeding: None.  Movement: Present. Denies leaking of fluid.   The following portions of the patient's history were reviewed and updated as appropriate: allergies, current medications, past family history, past medical history, past social history, past surgical history and problem list. Problem list updated.  Objective:   Vitals:   03/20/20 0926  BP: 117/78  Pulse: 89  Weight: 167 lb (75.8 kg)    Fetal Status: Fetal Heart Rate (bpm): 159 Fundal Height: 39 cm Movement: Present  Presentation: Vertex  General:  Alert, oriented and cooperative. Patient is in no acute distress.  Skin: Skin is warm and dry. No rash noted.   Cardiovascular: Normal heart rate noted  Respiratory: Normal respiratory effort, no problems with respiration noted  Abdomen: Soft, gravid, appropriate for gestational age.  Pain/Pressure: Present     Pelvic: Cervical exam performed Dilation: 1 Effacement (%): 50 Station: Ballotable  Extremities: Normal range of motion.  Edema: None  Mental Status: Normal mood and affect. Normal behavior. Normal judgment and thought content.   Assessment and Plan:  Pregnancy: G2P1001 at [redacted]w[redacted]d  1. Supervision of  other normal pregnancy, antepartum - LROB, no acute concerns. P1 PDIOL at 41.1, birth weight 6lb 7 oz - Cervix is very posterior, fetus not well engaged. Patient poorly tolerant of cervical exam.  - Familiar with location of MAU for labor check, ROM  2. Benign gestational thrombocytopenia in third trimester (HCC) - 127-->107 on 02/28/2020  3. Major depressive disorder affecting pregnancy - Advise two week postpartum mood check   Elective IOL scheduled for 03/27/2020 per patient request. Plan for Cytotec and foley balloon based on today's exam. Orders placed. Discussed with patient she is elective induction therefore high chance of being bumped for high census than induction for medical indication.  Reviewed that IOL appointment shows as 0630 but patient should stay home until called by unit, stay close to phone and return call within one hour.   Term labor symptoms and general obstetric precautions including but not limited to vaginal bleeding, contractions, leaking of fluid and fetal movement were reviewed in detail with the patient. Please refer to After Visit Summary for other counseling recommendations.    Future Appointments  Date Time Provider Department Center  03/27/2020  6:30 AM [facility] SCHED ROOM [facility] None    Calvert Cantor, CNM

## 2020-03-20 NOTE — Patient Instructions (Signed)
Labor Induction Labor induction is when steps are taken to cause a pregnant woman to begin the labor process. Most women go into labor on their own between 37 weeks and 42 weeks of pregnancy. When this does not happen, or when there is a medical need for labor to begin, steps may be taken to induce, or bring on, labor. Labor induction causes a pregnant woman's uterus to contract. It also causes the cervix to soften (ripen), open (dilate), and thin out. Usually, labor is not induced before 39 weeks of pregnancy unless there is a medical reason to do so. When is labor induction considered? Labor induction may be right for you if:  Your pregnancy lasts longer than 41 to 42 weeks.  Your placenta is separating from your uterus (placental abruption).  You have a rupture of membranes and your labor does not begin.  You have health problems, like diabetes or high blood pressure (preeclampsia) during your pregnancy.  Your baby has stopped growing or does not have enough amniotic fluid. Before labor induction begins, your health care provider will consider the following factors:  Your medical condition and the baby's condition.  How many weeks you have been pregnant.  How mature the baby's lungs are.  The condition of your cervix.  The position of the baby.  The size of your birth canal. Tell a health care provider about:  Any allergies you have.  All medicines you are taking, including vitamins, herbs, eye drops, creams, and over-the-counter medicines.  Any problems you or your family members have had with anesthetic medicines.  Any surgeries you have had.  Any blood disorders you have.  Any medical conditions you have. What are the risks? Generally, this is a safe procedure. However, problems may occur, including:  Failed induction.  Changes in fetal heart rate, such as being too high, too low, or irregular (erratic).  Infection in the mother or the baby.  Increased risk of  having a cesarean delivery.  Breaking off (abruption) of the placenta from the uterus. This is rare.  Rupture of the uterus. This is very rare.  Your baby could fail to get enough blood flow or oxygen. This can be life-threatening. When induction is needed for medical reasons, the benefits generally outweigh the risks. What happens during the procedure? During the procedure, your health care provider will use one of these methods to induce labor:  Stripping the membranes. In this method, the amniotic sac tissue is gently separated from the cervix. This causes the following to happen: ? Your cervix stretches, which in turn causes the release of prostaglandins. ? Prostaglandins induce labor and cause the uterus to contract. ? This procedure is often done in an office visit. You will be sent home to wait for contractions to begin.  Prostaglandin medicine. This medicine starts contractions and causes the cervix to dilate and ripen. This can be taken by mouth (orally) or by being inserted into the vagina (suppository).  Inserting a small, thin tube (catheter) with a balloon into the vagina and then expanding the balloon with water to dilate the cervix.  Breaking the water. In this method, a small instrument is used to make a small hole in the amniotic sac. This eventually causes the amniotic sac to break. Contractions should begin within a few hours.  Medicine to trigger or strengthen contractions. This medicine is given through an IV that is inserted into a vein in your arm. This procedure may vary among health care providers and hospitals.     Where to find more information  March of D[employer]: www.marchofdimes.org  The American College of Obstetricians and Gynecologists: www.acog.org Summary  Labor induction causes a pregnant woman's uterus to contract. It also causes the cervix to soften (ripen), open (dilate), and thin out.  Labor is usually not induced before 39 weeks of pregnancy unless  there is a medical reason to do so.  When induction is needed for medical reasons, the benefits generally outweigh the risks.  Talk with your health care provider about which methods of labor induction are right for you. This information is not intended to replace advice given to you by your health care provider. Make sure you discuss any questions you have with your health care provider. Document Revised: 11/17/2019 Document Reviewed: 11/17/2019 Elsevier Patient Education  2021 Elsevier Inc.  

## 2020-03-22 ENCOUNTER — Telehealth (HOSPITAL_COMMUNITY): Payer: Self-pay | Admitting: *Deleted

## 2020-03-22 ENCOUNTER — Encounter (HOSPITAL_COMMUNITY): Payer: Self-pay | Admitting: *Deleted

## 2020-03-22 NOTE — Telephone Encounter (Signed)
Preadmission screen  

## 2020-03-23 ENCOUNTER — Other Ambulatory Visit (HOSPITAL_COMMUNITY)
Admission: RE | Admit: 2020-03-23 | Discharge: 2020-03-23 | Disposition: A | Discharge status: Home / Self Care | Payer: Medicaid Other | Source: Ambulatory Visit | Attending: Family Medicine | Admitting: Family Medicine

## 2020-03-23 DIAGNOSIS — Z01812 Encounter for preprocedural laboratory examination: Secondary | ICD-10-CM | POA: Insufficient documentation

## 2020-03-23 DIAGNOSIS — Z20822 Contact with and (suspected) exposure to covid-19: Secondary | ICD-10-CM | POA: Insufficient documentation

## 2020-03-23 LAB — SARS CORONAVIRUS 2 (TAT 6-24 HRS): SARS Coronavirus 2: NEGATIVE

## 2020-03-24 ENCOUNTER — Other Ambulatory Visit (HOSPITAL_COMMUNITY): Payer: Medicaid Other

## 2020-03-26 ENCOUNTER — Other Ambulatory Visit: Payer: Self-pay

## 2020-03-26 ENCOUNTER — Encounter (HOSPITAL_COMMUNITY): Payer: Self-pay | Admitting: Family Medicine

## 2020-03-26 ENCOUNTER — Inpatient Hospital Stay (HOSPITAL_COMMUNITY)
Admission: AD | Admit: 2020-03-26 | Discharge: 2020-03-28 | DRG: 806 | Disposition: A | Discharge status: Home / Self Care | Payer: Medicaid Other | Attending: Family Medicine | Admitting: Family Medicine

## 2020-03-26 DIAGNOSIS — Z7289 Other problems related to lifestyle: Secondary | ICD-10-CM

## 2020-03-26 DIAGNOSIS — F4310 Post-traumatic stress disorder, unspecified: Secondary | ICD-10-CM | POA: Diagnosis present

## 2020-03-26 DIAGNOSIS — Z3480 Encounter for supervision of other normal pregnancy, unspecified trimester: Secondary | ICD-10-CM

## 2020-03-26 DIAGNOSIS — Z3490 Encounter for supervision of normal pregnancy, unspecified, unspecified trimester: Secondary | ICD-10-CM

## 2020-03-26 DIAGNOSIS — Z3A40 40 weeks gestation of pregnancy: Secondary | ICD-10-CM | POA: Diagnosis not present

## 2020-03-26 DIAGNOSIS — O4292 Full-term premature rupture of membranes, unspecified as to length of time between rupture and onset of labor: Principal | ICD-10-CM | POA: Diagnosis present

## 2020-03-26 DIAGNOSIS — O9912 Other diseases of the blood and blood-forming organs and certain disorders involving the immune mechanism complicating childbirth: Secondary | ICD-10-CM | POA: Diagnosis present

## 2020-03-26 DIAGNOSIS — Z609 Problem related to social environment, unspecified: Secondary | ICD-10-CM

## 2020-03-26 DIAGNOSIS — D6959 Other secondary thrombocytopenia: Secondary | ICD-10-CM | POA: Diagnosis present

## 2020-03-26 DIAGNOSIS — O99113 Other diseases of the blood and blood-forming organs and certain disorders involving the immune mechanism complicating pregnancy, third trimester: Secondary | ICD-10-CM | POA: Diagnosis present

## 2020-03-26 DIAGNOSIS — O4202 Full-term premature rupture of membranes, onset of labor within 24 hours of rupture: Secondary | ICD-10-CM | POA: Diagnosis not present

## 2020-03-26 DIAGNOSIS — Z659 Problem related to unspecified psychosocial circumstances: Secondary | ICD-10-CM

## 2020-03-26 DIAGNOSIS — F411 Generalized anxiety disorder: Secondary | ICD-10-CM | POA: Diagnosis present

## 2020-03-26 DIAGNOSIS — O4290 Premature rupture of membranes, unspecified as to length of time between rupture and onset of labor, unspecified weeks of gestation: Principal | ICD-10-CM

## 2020-03-26 DIAGNOSIS — D696 Thrombocytopenia, unspecified: Secondary | ICD-10-CM | POA: Diagnosis present

## 2020-03-26 DIAGNOSIS — O99340 Other mental disorders complicating pregnancy, unspecified trimester: Secondary | ICD-10-CM

## 2020-03-26 DIAGNOSIS — Z3A39 39 weeks gestation of pregnancy: Secondary | ICD-10-CM | POA: Diagnosis not present

## 2020-03-26 DIAGNOSIS — F410 Panic disorder [episodic paroxysmal anxiety] without agoraphobia: Secondary | ICD-10-CM | POA: Diagnosis present

## 2020-03-26 DIAGNOSIS — F329 Major depressive disorder, single episode, unspecified: Secondary | ICD-10-CM | POA: Diagnosis present

## 2020-03-26 LAB — CBC
HCT: 34.2 % — ABNORMAL LOW (ref 36.0–46.0)
Hemoglobin: 11.6 g/dL — ABNORMAL LOW (ref 12.0–15.0)
MCH: 27.8 pg (ref 26.0–34.0)
MCHC: 33.9 g/dL (ref 30.0–36.0)
MCV: 81.8 fL (ref 80.0–100.0)
Platelets: 111 10*3/uL — ABNORMAL LOW (ref 150–400)
RBC: 4.18 MIL/uL (ref 3.87–5.11)
RDW: 13.4 % (ref 11.5–15.5)
WBC: 9 10*3/uL (ref 4.0–10.5)
nRBC: 0 % (ref 0.0–0.2)

## 2020-03-26 LAB — TYPE AND SCREEN
ABO/RH(D): B POS
Antibody Screen: NEGATIVE

## 2020-03-26 LAB — WET PREP, GENITAL
Clue Cells Wet Prep HPF POC: NONE SEEN
Sperm: NONE SEEN
Trich, Wet Prep: NONE SEEN
Yeast Wet Prep HPF POC: NONE SEEN

## 2020-03-26 LAB — AMNISURE RUPTURE OF MEMBRANE (ROM) NOT AT ARMC: Amnisure ROM: POSITIVE

## 2020-03-26 LAB — POCT FERN TEST: POCT Fern Test: NEGATIVE

## 2020-03-26 MED ORDER — MISOPROSTOL 25 MCG QUARTER TABLET: 25.0000 ug | ORAL_TABLET | ORAL | Status: DC | PRN

## 2020-03-26 MED ORDER — EPHEDRINE 5 MG/ML INJ: 10.0000 mg | INTRAVENOUS | Status: DC | PRN

## 2020-03-26 MED ORDER — OXYTOCIN-SODIUM CHLORIDE 30-0.9 UT/500ML-% IV SOLN
1.0000 m[IU]/min | INTRAVENOUS | Status: DC
  Administered 2020-03-26: 2 m[IU]/min via INTRAVENOUS
  Filled 2020-03-26: qty 500

## 2020-03-26 MED ORDER — PHENYLEPHRINE 40 MCG/ML (10ML) SYRINGE FOR IV PUSH (FOR BLOOD PRESSURE SUPPORT)
80.0000 ug | PREFILLED_SYRINGE | INTRAVENOUS | Status: DC | PRN
  Administered 2020-03-27: 80 ug via INTRAVENOUS

## 2020-03-26 MED ORDER — FLEET ENEMA 7-19 GM/118ML RE ENEM: 1.0000 | ENEMA | RECTAL | Status: DC | PRN

## 2020-03-26 MED ORDER — ONDANSETRON HCL 4 MG/2ML IJ SOLN: 4.0000 mg | Freq: Four times a day (QID) | INTRAMUSCULAR | Status: DC | PRN

## 2020-03-26 MED ORDER — OXYTOCIN BOLUS FROM INFUSION
333.0000 mL | Freq: Once | INTRAVENOUS | Status: AC
  Administered 2020-03-27: 333 mL via INTRAVENOUS

## 2020-03-26 MED ORDER — FENTANYL-BUPIVACAINE-NACL 0.5-0.125-0.9 MG/250ML-% EP SOLN
12.0000 mL/h | EPIDURAL | Status: DC | PRN
  Filled 2020-03-26: qty 250

## 2020-03-26 MED ORDER — DIPHENHYDRAMINE HCL 50 MG/ML IJ SOLN: 12.5000 mg | INTRAMUSCULAR | Status: DC | PRN

## 2020-03-26 MED ORDER — TERBUTALINE SULFATE 1 MG/ML IJ SOLN: 0.2500 mg | Freq: Once | INTRAMUSCULAR | Status: DC | PRN

## 2020-03-26 MED ORDER — LIDOCAINE HCL (PF) 1 % IJ SOLN: 30.0000 mL | INTRAMUSCULAR | Status: DC | PRN

## 2020-03-26 MED ORDER — LACTATED RINGERS IV SOLN: 500.0000 mL | Freq: Once | INTRAVENOUS | Status: DC

## 2020-03-26 MED ORDER — ACETAMINOPHEN 325 MG PO TABS: 650.0000 mg | ORAL_TABLET | ORAL | Status: DC | PRN

## 2020-03-26 MED ORDER — LACTATED RINGERS IV SOLN: 500.0000 mL | INTRAVENOUS | Status: DC | PRN

## 2020-03-26 MED ORDER — PHENYLEPHRINE 40 MCG/ML (10ML) SYRINGE FOR IV PUSH (FOR BLOOD PRESSURE SUPPORT)
80.0000 ug | PREFILLED_SYRINGE | INTRAVENOUS | Status: DC | PRN
  Filled 2020-03-26: qty 10

## 2020-03-26 MED ORDER — SOD CITRATE-CITRIC ACID 500-334 MG/5ML PO SOLN: 30.0000 mL | ORAL | Status: DC | PRN

## 2020-03-26 MED ORDER — OXYTOCIN-SODIUM CHLORIDE 30-0.9 UT/500ML-% IV SOLN: 2.5000 [IU]/h | INTRAVENOUS | Status: DC

## 2020-03-26 MED ORDER — MISOPROSTOL 50MCG HALF TABLET
50.0000 ug | ORAL_TABLET | ORAL | Status: DC
  Administered 2020-03-26: 50 ug via BUCCAL
  Filled 2020-03-26: qty 1

## 2020-03-26 MED ORDER — LACTATED RINGERS IV SOLN: INTRAVENOUS | Status: DC

## 2020-03-26 NOTE — Progress Notes (Signed)
Labor Progress Note [name] is a 20 y.o. G2P1001 at [redacted]w[redacted]d presented for PROM S: Feeling slightly more uncomfortable   O:  BP 122/69   Pulse 85   Temp 98.7 F (37.1 C) (Oral)   Resp 18   Ht 5\' 2"  (1.575 m)   Wt 76.8 kg   SpO2 99%   BMI 30.97 kg/m  EFM: baseline 155/mod variability/pos accels/no decels   CVE: Dilation: 2 Effacement (%): 50 Cervical Position: Posterior Station: -3 Presentation: Vertex Exam by:: [email or web address], RN   A&P: 20 y.o. G2P1001 [redacted]w[redacted]d  #Induction of Labor for PROM: ROM at 0000 on 2/7. s/p cytotec, transitioned to pitocin at 2130 and now feeling more uncomfortable. Continue to titrate pitocin.   #Pain: epidural prn  #FWB: cat I  #GBS negative   2131, MD 10:32 PM

## 2020-03-26 NOTE — MAU Provider Note (Signed)
S: [name] is a 20 y.o. G2P1001 at [redacted]w[redacted]d  who presents to MAU today complaining of leaking of clear fluid since Friday 03/23/2020. She denies vaginal bleeding. She endorses irregular contractions. She reports normal fetal movement.    O: BP 126/71 (BP Location: Right Arm)   Pulse 92   Temp 98.1 F (36.7 C) (Oral)   Resp 18   Ht 5\' 2"  (1.575 m)   Wt 76.8 kg   SpO2 99%   BMI 30.97 kg/m  GENERAL: Well-developed, well-nourished female in no acute distress.  HEAD: Normocephalic, atraumatic.  CHEST: Normal effort of breathing, regular heart rate ABDOMEN: Soft, nontender, gravid PELVIC: Normal external female genitalia. Vagina is pink and rugated. Cervix with normal contour, no lesions. Normal discharge.  ? Pooling of watery, pinkish white fluid in vaginal vault. Amnisure and wet prep samples obtained.  Cervical exam:  Dilation: 1.5 Effacement (%): 70 Cervical Position: Posterior Station: -3 Presentation: Vertex Exam by:: [email or web address] CNM   Fetal Monitoring: Baseline: 140 Variability: moderate Accelerations: present Decelerations: absent Contractions: irregular UC's  Results for orders placed or performed during the hospital encounter of 03/26/20 (from the past 24 hour(s))  Amnisure rupture of membrane (rom)not at [facility]     Status: None   Collection Time: 03/26/20 12:49 PM  Result Value Ref Range   Amnisure ROM POSITIVE   Wet prep, genital     Status: Abnormal   Collection Time: 03/26/20 12:49 PM   Specimen: Cervix  Result Value Ref Range   Yeast Wet Prep HPF POC NONE SEEN NONE SEEN   Trich, Wet Prep NONE SEEN NONE SEEN   Clue Cells Wet Prep HPF POC NONE SEEN NONE SEEN   WBC, Wet Prep HPF POC MANY (A) NONE SEEN   Sperm NONE SEEN       A: SIUP at [redacted]w[redacted]d  SROM  P: RN instructed to call the labor team for admission orders [redacted]w[redacted]d, MD made aware of SROM and admission  Mart Piggs, CNM 03/26/2020, 12:47 PM

## 2020-03-26 NOTE — H&P (Addendum)
OBSTETRIC ADMISSION HISTORY AND PHYSICAL  Victoria Garrett is a 20 y.o. female G2P1001 with IUP at [redacted]w[redacted]d by LMP presenting for PROM, reportedly LOF overnight 2/6-2/7. She reports +FMs, no VB, no blurry vision, headaches or peripheral edema, and RUQ pain.  She plans on breast feeding. She request Depo for birth control. She received her prenatal care at [facility] (transfer from [facility])  Dating: By LMP --->  Estimated Date of Delivery: 03/27/20  Sono:    11[birth year]@[redacted]w[redacted]d , CWD, normal anatomy, breech presentation, anterior placenta, 316g, 64% EFW   Prenatal History/Complications:  Benign gestational thrombocytopenia in 3rd trimester MDD affecting pregnancy  Poor social situation Generalized anxiety disorder with panic attacks Hx of deliberate self-cutting  UTI during pregnancy, 3rd trimester    Past Medical History: Past Medical History:  Diagnosis Date  . Anemia   . Heart murmur    as a child     Past Surgical History: Past Surgical History:  Procedure Laterality Date  . IR NEPHROURETERAL CATH PLACE RIGHT Right 10/2015  . NEPHROSTOMY TUBE PLACEMENT ([facility] HX)      Obstetrical History: OB History    Gravida  2   Para  1   Term  1   Preterm      AB      Living  1     SAB      IAB      Ectopic      Multiple      Live Births  1           Social History Social History   Socioeconomic History  . Marital status: Single    Spouse name: Not on file  . Number of children: 1  . Years of education: Not on file  . Highest education level: Not on file  Occupational History  . Not on file  Tobacco Use  . Smoking status: Never Smoker  . Smokeless tobacco: Never Used  Vaping Use  . Vaping Use: Never used  Substance and Sexual Activity  . Alcohol use: Not Currently  . Drug use: Never  . Sexual activity: Not Currently    Birth control/protection: None  Other Topics Concern  . Not on file  Social History Narrative  . Not on file   Social Determinants  of Health   Financial Resource Strain: Not on file  Food Insecurity: Not on file  Transportation Needs: Not on file  Physical Activity: Not on file  Stress: Not on file  Social Connections: Not on file    Family History: Family History  Problem Relation Age of Onset  . Healthy Mother   . Healthy Father   . Asthma Sister   . Diabetes Maternal Grandmother     Allergies: No Known Allergies  Medications Prior to Admission  Medication Sig Dispense Refill Last Dose  . fluconazole (DIFLUCAN) 150 MG tablet Take 1 tab now repeat in three days if needed. (Patient not taking: Reported on 03/20/2020) 2 tablet 0      Review of Systems   All systems reviewed and negative except as stated in HPI  Blood pressure 120/63, pulse 84, temperature 98 F (36.7 C), temperature source Oral, resp. rate 16, height 5\' 2"  (1.575 m), weight 76.8 kg, SpO2 99 %. General appearance: alert, cooperative, appears stated age and no distress Lungs: normal work of breathing Abdomen: soft, non-tender, gravid Presentation: cephalic by cervical exam Fetal monitoringBaseline: 140 bpm, Variability: Good {> 6 bpm), Accelerations: Reactive and Decelerations: Absent Uterine activityFrequency: Every 2-7  minutes Dilation: 2 Effacement (%): 70 Station: -2 Exam by:: Wilfred Lacy RN   Prenatal labs: ABO, Rh: --/--/B POS (02/07 1450) Antibody: NEG (02/07 1450) Rubella: Nonimmune (07/01 0000) RPR:   previously NR (CareEverywhere) HBsAg:   NR (CareEverywhere) HIV: Non-reactive (11/17 0000)  GBS: Negative/-- (01/11 0312)  1 hr Glucola Normal  Genetic screening reportedly normal, results not available Anatomy US Normal   Prenatal Transfer Tool  Maternal Diabetes: No Genetic Screening: Normal per patient Maternal Ultrasounds/Referrals: Normal Fetal Ultrasounds or other Referrals:  None Maternal Substance Abuse:  No Significant Maternal Medications:  None Significant Maternal Lab Results: Group B Strep  negative  Results for orders placed or performed during the hospital encounter of 03/26/20 (from the past 24 hour(s))  Wet prep, genital   Collection Time: 03/26/20 12:49 PM   Specimen: Cervix  Result Value Ref Range   Yeast Wet Prep HPF POC NONE SEEN NONE SEEN   Trich, Wet Prep NONE SEEN NONE SEEN   Clue Cells Wet Prep HPF POC NONE SEEN NONE SEEN   WBC, Wet Prep HPF POC MANY (A) NONE SEEN   Sperm NONE SEEN   Amnisure rupture of membrane (rom)not at [facility]   Collection Time: 03/26/20 12:49 PM  Result Value Ref Range   Amnisure ROM POSITIVE   POCT fern test   Collection Time: 03/26/20  1:45 PM  Result Value Ref Range   POCT Fern Test Negative = intact amniotic membranes   Type and screen   Collection Time: 03/26/20  2:50 PM  Result Value Ref Range   ABO/RH(D) B POS    Antibody Screen NEG    Sample Expiration      03/29/2020,2359 Performed at [facility], [address], [place], [place] [address]   CBC   Collection Time: 03/26/20  2:56 PM  Result Value Ref Range   WBC 9.0 4.0 - 10.5 K/uL   RBC 4.18 3.87 - 5.11 MIL/uL   Hemoglobin 11.6 (L) 12.0 - 15.0 g/dL   HCT 93.2 (L) 35.5 - 73.2 %   MCV 81.8 80.0 - 100.0 fL   MCH 27.8 26.0 - 34.0 pg   MCHC 33.9 30.0 - 36.0 g/dL   RDW 20.2 54.2 - 70.6 %   Platelets 111 (L) 150 - 400 K/uL   nRBC 0.0 0.0 - 0.2 %    Patient Active Problem List   Diagnosis Date Noted  . Labor and delivery, indication for care 03/26/2020  . Deliberate self-cutting 03/20/2020  . UTI in pregnancy 03/02/2020  . Benign gestational thrombocytopenia in third trimester (HCC) 02/28/2020  . Preterm uterine contractions in third trimester, antepartum 02/28/2020  . Generalized anxiety disorder with panic attacks 02/14/2020  . Major depressive disorder affecting pregnancy 02/14/2020  . Poor social situation 02/14/2020  . History of pyelonephritis during pregnancy 02/14/2020  . Supervision of other normal pregnancy, antepartum 02/13/2020  . PTSD  (post-traumatic stress disorder) 03/03/2018  . Hydronephrosis, right 10/03/2015    Assessment/Plan:  [name] is a 20 y.o. G2P1001 at [redacted]w[redacted]d here for PROM.  #PROM: Patient reports ROM with clear fluid overnight 2/6-2/7, unsure timing. Has made minimal cervical change since presentation, given cervical exam will dose cytotec buccal and plan to start pitocin in 4 hours. #Pain: PRN, desires epidural #FWB: Category 1 #ID:  GBS negative #MOF: Breast feeding #MOC: Depo #MDD/Anxiety/PTSD/poor social situation/hx deliberate self cutting: Social work consult. Was on hydroxyzine PRN months prior, has not received any since. Seeing a therapist outpatient  who d/c'd Rx.  #Benign gestational thrombocytopenia: Remained asymptomatic. Plt count was 111 on admit, continue monitoring.   Alric Seton, MD  03/26/2020, 5:13 PM

## 2020-03-26 NOTE — Anesthesia Preprocedure Evaluation (Addendum)
Anesthesia Evaluation  Patient identified by MRN, date of birth, ID band Patient awake    Reviewed: Allergy & Precautions, NPO status , Patient's Chart, lab work & pertinent test results  Airway Mallampati: II  TM Distance: >3 FB Neck ROM: Full    Dental no notable dental hx. (+) Teeth Intact, Dental Advisory Given   Pulmonary neg pulmonary ROS,    Pulmonary exam normal breath sounds clear to auscultation       Cardiovascular Exercise Tolerance: Good negative cardio ROS Normal cardiovascular exam Rhythm:Regular Rate:Normal     Neuro/Psych PSYCHIATRIC DISORDERS PTSDnegative neurological ROS     GI/Hepatic negative GI ROS, Neg liver ROS,   Endo/Other  negative endocrine ROS  Renal/GU      Musculoskeletal   Abdominal   Peds  Hematology Lab Results      Component                Value               Date                      WBC                      9.0                 03/26/2020                HGB                      11.6 (L)            03/26/2020                HCT                      34.2 (L)            03/26/2020                MCV                      81.8                03/26/2020                PLT                      111 (L)             03/26/2020              Anesthesia Other Findings   Reproductive/Obstetrics (+) Pregnancy                            Anesthesia Physical Anesthesia Plan  ASA: II  Anesthesia Plan: Epidural   Post-op Pain Management:    Induction:   PONV Risk Score and Plan:   Airway Management Planned:   Additional Equipment:   Intra-op Plan:   Post-operative Plan:   Informed Consent: I have reviewed the patients History and Physical, chart, labs and discussed the procedure including the risks, benefits and alternatives for the proposed anesthesia with the patient or authorized representative who has indicated his/her understanding and acceptance.        Plan Discussed with:   Anesthesia Plan Comments: (39.6 Wk G2P1 for LEA)  Anesthesia Quick Evaluation  

## 2020-03-26 NOTE — MAU Note (Signed)
Patient presented to MAU at [redacted]w[redacted]d gestation with c/o possible ROM. Patient states she started leaking clear fluid on Friday 03/23/20, no vaginal bleeding, patient feels fetal movement, and is having irregular contractions.

## 2020-03-27 ENCOUNTER — Inpatient Hospital Stay (HOSPITAL_COMMUNITY)
Admission: AD | Admit: 2020-03-27 | Payer: Medicaid Other | Source: Home / Self Care | Admitting: Obstetrics and Gynecology

## 2020-03-27 ENCOUNTER — Inpatient Hospital Stay (HOSPITAL_COMMUNITY): Payer: Medicaid Other | Admitting: Anesthesiology

## 2020-03-27 ENCOUNTER — Inpatient Hospital Stay (HOSPITAL_COMMUNITY): Payer: Medicaid Other

## 2020-03-27 ENCOUNTER — Encounter (HOSPITAL_COMMUNITY): Payer: Self-pay | Admitting: Family Medicine

## 2020-03-27 DIAGNOSIS — O4202 Full-term premature rupture of membranes, onset of labor within 24 hours of rupture: Secondary | ICD-10-CM

## 2020-03-27 DIAGNOSIS — Z3A40 40 weeks gestation of pregnancy: Secondary | ICD-10-CM

## 2020-03-27 LAB — RPR: RPR Ser Ql: NONREACTIVE

## 2020-03-27 MED ORDER — PRENATAL MULTIVITAMIN CH
1.0000 | ORAL_TABLET | Freq: Every day | ORAL | Status: DC
  Filled 2020-03-27 (×2): qty 1

## 2020-03-27 MED ORDER — DOCUSATE SODIUM 100 MG PO CAPS
100.0000 mg | ORAL_CAPSULE | Freq: Two times a day (BID) | ORAL | Status: DC
  Administered 2020-03-28: 100 mg via ORAL
  Filled 2020-03-27 (×2): qty 1

## 2020-03-27 MED ORDER — TETANUS-DIPHTH-ACELL PERTUSSIS 5-2.5-18.5 LF-MCG/0.5 IM SUSY: 0.5000 mL | PREFILLED_SYRINGE | Freq: Once | INTRAMUSCULAR | Status: DC

## 2020-03-27 MED ORDER — FENTANYL CITRATE (PF) 100 MCG/2ML IJ SOLN
50.0000 ug | INTRAMUSCULAR | Status: DC | PRN
  Administered 2020-03-27: 100 ug via INTRAVENOUS
  Filled 2020-03-27: qty 2

## 2020-03-27 MED ORDER — SIMETHICONE 80 MG PO CHEW
80.0000 mg | CHEWABLE_TABLET | ORAL | Status: DC | PRN
Start: 2020-03-27 — End: 2020-03-28

## 2020-03-27 MED ORDER — BENZOCAINE-MENTHOL 20-0.5 % EX AERO
1.0000 "application " | INHALATION_SPRAY | CUTANEOUS | Status: DC | PRN
  Administered 2020-03-27: 1 via TOPICAL
  Filled 2020-03-27: qty 56

## 2020-03-27 MED ORDER — DIPHENHYDRAMINE HCL 25 MG PO CAPS: 25.0000 mg | ORAL_CAPSULE | Freq: Four times a day (QID) | ORAL | Status: DC | PRN

## 2020-03-27 MED ORDER — ONDANSETRON HCL 4 MG/2ML IJ SOLN: 4.0000 mg | INTRAMUSCULAR | Status: DC | PRN

## 2020-03-27 MED ORDER — COCONUT OIL OIL: 1.0000 "application " | TOPICAL_OIL | Status: DC | PRN

## 2020-03-27 MED ORDER — WITCH HAZEL-GLYCERIN EX PADS: 1.0000 "application " | MEDICATED_PAD | CUTANEOUS | Status: DC | PRN

## 2020-03-27 MED ORDER — DIBUCAINE (PERIANAL) 1 % EX OINT
1.0000 "application " | TOPICAL_OINTMENT | CUTANEOUS | Status: DC | PRN
  Administered 2020-03-27: 1 via RECTAL
  Filled 2020-03-27: qty 28

## 2020-03-27 MED ORDER — MEDROXYPROGESTERONE ACETATE 150 MG/ML IM SUSP
150.0000 mg | INTRAMUSCULAR | Status: AC | PRN
  Administered 2020-03-28: 150 mg via INTRAMUSCULAR
  Filled 2020-03-27: qty 1

## 2020-03-27 MED ORDER — SENNOSIDES-DOCUSATE SODIUM 8.6-50 MG PO TABS
2.0000 | ORAL_TABLET | ORAL | Status: DC
  Administered 2020-03-27 – 2020-03-28 (×2): 2 via ORAL
  Filled 2020-03-27 (×3): qty 2

## 2020-03-27 MED ORDER — IBUPROFEN 600 MG PO TABS
600.0000 mg | ORAL_TABLET | Freq: Four times a day (QID) | ORAL | Status: DC
  Administered 2020-03-27 – 2020-03-28 (×5): 600 mg via ORAL
  Filled 2020-03-27 (×5): qty 1

## 2020-03-27 MED ORDER — LIDOCAINE HCL (PF) 1 % IJ SOLN
INTRAMUSCULAR | Status: DC | PRN
  Administered 2020-03-27: 5 mL via EPIDURAL

## 2020-03-27 MED ORDER — FENTANYL-BUPIVACAINE-NACL 0.5-0.125-0.9 MG/250ML-% EP SOLN
EPIDURAL | Status: DC | PRN
  Administered 2020-03-27: 12 mL/h via EPIDURAL

## 2020-03-27 MED ORDER — ONDANSETRON HCL 4 MG PO TABS: 4.0000 mg | ORAL_TABLET | ORAL | Status: DC | PRN

## 2020-03-27 MED ORDER — ACETAMINOPHEN 325 MG PO TABS
650.0000 mg | ORAL_TABLET | ORAL | Status: DC | PRN
  Administered 2020-03-27: 650 mg via ORAL
  Filled 2020-03-27: qty 2

## 2020-03-27 NOTE — Discharge Summary (Addendum)
Postpartum Discharge Summary     Patient Name: Victoria Garrett DOB: [birth year] MRN: [id number]  Date of admission: 03/26/2020 Delivery date:03/27/2020  Delivering provider: Janet Berlin  Date of discharge: 03/28/2020  Admitting diagnosis: Labor and delivery, indication for care [O75.9] Intrauterine pregnancy: [redacted]w[redacted]d     Secondary diagnosis:  Active Problems:   Supervision of other normal pregnancy, antepartum   Generalized anxiety disorder with panic attacks   Major depressive disorder affecting pregnancy   Poor social situation   Benign gestational thrombocytopenia in third trimester [facility])   PTSD (post-traumatic stress disorder)   Deliberate self-cutting   Labor and delivery, indication for care   Additional problems: none    Discharge diagnosis: Term Pregnancy Delivered                                              Post partum procedures: none Augmentation: Pitocin and Cytotec Complications: None  Hospital course: Induction of Labor With Vaginal Delivery   20 y.o. yo G2P1001 at [redacted]w[redacted]d was admitted to the hospital 03/26/2020 for induction of labor.  Indication for induction:  PROM .  Patient had an uncomplicated labor course as follows: Membrane Rupture Time/Date: 12:00 PM ,03/26/2020   Delivery Method:Vaginal, Spontaneous  Episiotomy: None  Lacerations:  None  Details of delivery can be found in separate delivery note.  Patient had a routine postpartum course. Patient is discharged home 03/28/20.  Newborn Data: Birth date:03/27/2020  Birth time:4:20 AM  Gender:Female  Living status:Living  Apgars:5 ,9  Weight:3311 g   Magnesium Sulfate received: No BMZ received: No Rhophylac:N/A MMR:N/A T-DaP:Given prenatally Flu: Yes Transfusion:No  Physical exam  Vitals:   03/27/20 0611 03/27/20 0722 03/27/20 2053 03/28/20 0500  BP: 122/71 111/68 113/66 106/76  Pulse: 87 67 93   Resp: $Remo'18 16 15 16  'BhvkP$ Temp: 97.7 F (36.5 C) 98.2 F (36.8 C) 98.4 F (36.9 C) 98.2 F  (36.8 C)  TempSrc: Oral Oral Oral Oral  SpO2: 100% 99% 100% 98%  Weight:      Height:       General: alert, cooperative and no distress Lochia: appropriate Uterine Fundus: firm Incision: N/A DVT Evaluation: No evidence of DVT seen on physical exam. Labs: Lab Results  Component Value Date   WBC 9.0 03/26/2020   HGB 11.6 (L) 03/26/2020   HCT 34.2 (L) 03/26/2020   MCV 81.8 03/26/2020   PLT 111 (L) 03/26/2020   CMP Latest Ref Rng & Units 03/05/2020  Glucose 70 - 99 mg/dL 84  BUN 6 - 20 mg/dL 5(L)  Creatinine 0.44 - 1.00 mg/dL 0.39(L)  Sodium 135 - 145 mmol/L 137  Potassium 3.5 - 5.1 mmol/L 3.8  Chloride 98 - 111 mmol/L 104  CO2 22 - 32 mmol/L 23  Calcium 8.9 - 10.3 mg/dL 9.2  Total Protein 6.5 - 8.1 g/dL 6.3(L)  Total Bilirubin 0.3 - 1.2 mg/dL 0.4  Alkaline Phos 38 - 126 U/L 122  AST 15 - 41 U/L 11(L)  ALT 0 - 44 U/L 14   Edinburgh Score: Edinburgh Postnatal Depression Scale Screening Tool 03/27/2020  I have been able to laugh and see the funny side of things. 0  I have looked forward with enjoyment to things. 0  I have blamed myself unnecessarily when things went wrong. 1  I have been anxious or worried for no good reason. 2  I have felt scared or panicky for no good reason. 1  Things have been getting on top of me. 1  I have been so unhappy that I have had difficulty sleeping. 0  I have felt sad or miserable. 0  I have been so unhappy that I have been crying. 0  The thought of harming myself has occurred to me. 0  Edinburgh Postnatal Depression Scale Total 5     After visit meds:  Allergies as of 03/28/2020   No Known Allergies      Medication List     TAKE these medications    coconut oil Oil Apply 1 application topically as needed.   ibuprofen 600 MG tablet Commonly known as: ADVIL Take 1 tablet (600 mg total) by mouth every 6 (six) hours.   prenatal multivitamin Tabs tablet Take 1 tablet by mouth daily at 12 noon.         Discharge home in  stable condition Infant Feeding: Breast Infant Disposition:home with mother Discharge instruction: per After Visit Summary and Postpartum booklet. Activity: Advance as tolerated. Pelvic rest for 6 weeks.  Diet: routine diet Future Appointments: Future Appointments  Date Time Provider Holly  04/03/2020  1:00 PM Lynnea Ferrier, LCSW [facility] None  04/26/2020  9:15 AM Lajean Manes, CNM [facility] None   Follow up Visit:    Please schedule this patient for a In person postpartum visit in 6 weeks with the following provider: Any provider. Additional Postpartum F/U:Postpartum Depression checkup  Low risk pregnancy complicated by:  history of depression , poor social situation  Delivery mode:  Vaginal, Spontaneous  Anticipated Birth Control:  Depo   03/28/2020 Shary Key, DO  I spoke with and examined patient and agree with resident/PA-S/MS/SNM's note and plan of care. SW has seen. No barriers to care. Has lived at a [facility] since Sept, and is going back there upon discharge.  Roma Schanz, CNM, [facility] 03/28/2020 7:57 AM

## 2020-03-27 NOTE — Anesthesia Procedure Notes (Deleted)
Epidural

## 2020-03-27 NOTE — Anesthesia Postprocedure Evaluation (Signed)
Anesthesia Post Note  Patient: Victoria Garrett  Procedure(s) Performed: AN AD HOC LABOR EPIDURAL     Patient location during evaluation: Mother Baby Anesthesia Type: Epidural Level of consciousness: awake and alert Pain management: pain level controlled Vital Signs Assessment: post-procedure vital signs reviewed and stable Respiratory status: spontaneous breathing, nonlabored ventilation and respiratory function stable Cardiovascular status: stable Postop Assessment: no headache, no backache, patient able to bend at knees, no apparent nausea or vomiting, able to ambulate and adequate PO intake Anesthetic complications: no   No complications documented.  Last Vitals:  Vitals:   03/27/20 0611 03/27/20 0722  BP: 122/71 111/68  Pulse: 87 67  Resp: 18 16  Temp: 36.5 C 36.8 C  SpO2: 100% 99%    Last Pain:  Vitals:   03/27/20 0821  TempSrc:   PainSc: 0-No pain   Pain Goal: Patients Stated Pain Goal: 0 (03/26/20 1523)                 Blythe Stanford

## 2020-03-27 NOTE — Anesthesia Procedure Notes (Addendum)
Epidural Patient location during procedure: OB Start time: 03/27/2020 1:37 AM End time: 03/27/2020 1:50 AM  Staffing Anesthesiologist: Trevor Iha, MD Performed: anesthesiologist   Preanesthetic Checklist Completed: patient identified, IV checked, site marked, risks and benefits discussed, surgical consent, monitors and equipment checked, pre-op evaluation and timeout performed  Epidural Patient position: sitting Prep: DuraPrep and site prepped and draped Patient monitoring: continuous pulse ox and blood pressure Approach: midline Location: L3-L4 Injection technique: LOR air  Needle:  Needle type: Tuohy  Needle gauge: 17 G Needle length: 9 cm and 9 Needle insertion depth: 7 cm Catheter type: closed end flexible Catheter size: 19 Gauge Catheter at skin depth: 12 cm Test dose: negative  Assessment Events: blood not aspirated, injection not painful, no injection resistance, no paresthesia and negative IV test  Additional Notes Patient identified. Risks/Benefits/Options discussed with patient including but not limited to bleeding, infection, nerve damage, paralysis, failed block, incomplete pain control, headache, blood pressure changes, nausea, vomiting, reactions to medication both or allergic, itching and postpartum back pain. Confirmed with bedside nurse the patient's most recent platelet count. Confirmed with patient that they are not currently taking any anticoagulation, have any bleeding history or any family history of bleeding disorders. Patient expressed understanding and wished to proceed. All questions were answered. Sterile technique was used throughout the entire procedure. Please see nursing notes for vital signs. Test dose was given through epidural needle and negative prior to continuing to dose epidural or start infusion. Warning signs of high block given to the patient including shortness of breath, tingling/numbness in hands, complete motor block, or any concerning  symptoms with instructions to call for help. Patient was given instructions on fall risk and not to get out of bed. All questions and concerns addressed with instructions to call with any issues.1  Attempt (S) . Patient tolerated procedure well.

## 2020-03-27 NOTE — Social Work (Signed)
CSW acknowledges consult and completed chart review. MOB is currently residing at Room at [facility] and is provided with a wealth of community resources for housing, mental health, parenting, and any other resources requested by MOB.  MOB has a case Production designer, theatre/television/film that MOB meets with weekly at [facility] the [facility]. MOB also has a counselor that she is seeing regularly.  MOB scored a 5 on the [place] Postnatal Depression Scare.   Please contact the Clinical Social Worker if needs arise or by [facility] request.  CSW identifies no further need for intervention and no barriers to discharge at this time.  Manfred Arch, MSW, [employer] Clinical Social Work [employer] and [employer] [phone]

## 2020-03-28 MED ORDER — MEDROXYPROGESTERONE ACETATE 150 MG/ML IM SUSP: 150.0000 mg | Freq: Once | INTRAMUSCULAR | Status: DC

## 2020-03-28 MED ORDER — COCONUT OIL OIL: 1.0000 "application " | TOPICAL_OIL | 0 refills | Status: DC | PRN

## 2020-03-28 MED ORDER — IBUPROFEN 600 MG PO TABS: 600.0000 mg | ORAL_TABLET | Freq: Four times a day (QID) | ORAL | 0 refills | Status: DC

## 2020-03-28 MED ORDER — PRENATAL MULTIVITAMIN CH: 1.0000 | ORAL_TABLET | Freq: Every day | ORAL | Status: DC

## 2020-03-28 NOTE — Discharge Instructions (Signed)

## 2020-04-03 ENCOUNTER — Ambulatory Visit (INDEPENDENT_AMBULATORY_CARE_PROVIDER_SITE_OTHER): Payer: Medicaid Other | Admitting: Licensed Clinical Social Worker

## 2020-04-03 DIAGNOSIS — Z658 Other specified problems related to psychosocial circumstances: Secondary | ICD-10-CM | POA: Diagnosis not present

## 2020-04-04 NOTE — BH Specialist Note (Signed)
Integrated Behavioral Health Initial In-Person Visit  MRN: [id number] Name: Victoria Garrett  Number of Integrated Behavioral Health Clinician visits:: 1/6 Session Start time: 1:06pm  Session End time: 1:30pm Total time: 24 minutes In person at [facility]   Types of Service: General Behavioral Integrated Care (BHI)  Interpretor:Yes.   Interpretor Name and Language: none   Warm Hand Off Completed.       Subjective: Victoria Garrett is a 20 y.o. female accompanied by n/a Patient was referred by hospital discharge for psychosocial stressors. Patient reports the following symptoms/concerns:  Duration of problem: approx 4 months; Severity of problem: mild  Objective: Mood: good and Affect: Appropriate Risk of harm to self or others: No plan to harm self or others  Life Context: Family and Social: Lives at [facility] with two children  School/Work: Attending GED program Self-Care: n/a Life Changes: Newborn  Patient and/or Family's Strengths/Protective Factors: Concrete supports in place (healthy food, safe environments, etc.)  Goals Addressed: Patient will: 1. Reduce symptoms of: psychosocial stress  2. Increase knowledge and/or ability of: self-management skills  3. Demonstrate ability to: Increase adequate support systems for patient/family  Progress towards Goals: Ongoing  Interventions: Interventions utilized: Supportive Counseling  Standardized Assessments completed: Edinburgh Postnatal Depression   Assessment: Patient currently experiencing psychosocial stressors .   Patient may benefit from case management  Plan: 1. Follow up with behavioral health clinician on : as needed  2. Behavioral recommendations: Collaborate with case manager to secure housing, education and career resources  3. Referral(s): n/a 4. "From scale of 1-10, how likely are you to follow plan?":   Gwyndolyn Saxon, LCSW

## 2020-04-26 ENCOUNTER — Encounter: Payer: Self-pay | Admitting: Certified Nurse Midwife

## 2020-04-26 ENCOUNTER — Other Ambulatory Visit: Payer: Self-pay

## 2020-04-26 ENCOUNTER — Ambulatory Visit (INDEPENDENT_AMBULATORY_CARE_PROVIDER_SITE_OTHER): Payer: Medicaid Other | Admitting: Certified Nurse Midwife

## 2020-04-26 MED ORDER — METOCLOPRAMIDE HCL 10 MG PO TABS: 10.0000 mg | ORAL_TABLET | Freq: Three times a day (TID) | ORAL | 0 refills | Status: DC | PRN

## 2020-04-26 NOTE — Progress Notes (Signed)
Post Partum Visit Note  Victoria Garrett is a 20 y.o. G37P2002 female who presents for a postpartum visit. She is four weeks following a method of vaginal delivery:  I have fully reviewed the prenatal and intrapartum course. The delivery was at 40 gestational weeks.  Anesthesia: Epidual. Postpartum course has been uncomplicated. Baby is doing well. Baby is feeding by breast. Bleeding scant light vaginal bleeding when wiping. Bowel function is normal. Bladder function is normal. Patient is sexually active. Contraception method is depo-provera. Postpartum depression screening: negative.   The pregnancy intention screening data noted above was reviewed. Potential methods of contraception were discussed. The patient elected to proceed with Hormonal Injection.    Edinburgh Postnatal Depression Scale - 04/26/20 0924      Edinburgh Postnatal Depression Scale:  In the Past 7 Days   I have been able to laugh and see the funny side of things. 0    I have looked forward with enjoyment to things. 0    I have blamed myself unnecessarily when things went wrong. 0    I have been anxious or worried for no good reason. 0    I have felt scared or panicky for no good reason. 0    Things have been getting on top of me. 0    I have been so unhappy that I have had difficulty sleeping. 0    I have felt sad or miserable. 0    I have been so unhappy that I have been crying. 0    The thought of harming myself has occurred to me. 0    Edinburgh Postnatal Depression Scale Total 0          The following portions of the patient's history were reviewed and updated as appropriate: allergies, current medications, past medical history, past surgical history and problem list.  Review of Systems A comprehensive review of systems was negative.    Objective:  BP 115/77   Pulse 99   Wt 159 lb 12.8 oz (72.5 kg)   BMI 29.23 kg/m    General:  alert, cooperative and no distress   Breasts:  inspection negative,  no nipple discharge or bleeding, no masses or nodularity palpable  Lungs: clear to auscultation bilaterally  Heart:  regular rate and rhythm  Abdomen: soft, non-tender; bowel sounds normal; no masses,  no organomegaly   Vulva:  not evaluated  Vagina: not evaluated  Cervix:  not evaluated  Corpus: not examined  Adnexa:  not evaluated  Rectal Exam: Not performed.        Assessment:  1. Postpartum care and examination - Normal postpartum exam. Pap smear not done at today's visit patient <21yo.  - Discussed with patient pap smear will start later this year and the reasons for pap smear - Patient reports occasional HA that tylenol does not help with. Patient reports minimal to no appetite - discussed with patient possible dehydration and food/hydration regularly with breastfeeding   - metoCLOPramide (REGLAN) 10 MG tablet; Take 1 tablet (10 mg total) by mouth every 8 (eight) hours as needed (headache, nausea, vomiting).  Dispense: 30 tablet; Refill: 0   Plan:   Essential components of care per ACOG recommendations:  1.  Mood and well being: Patient with negative depression screening today. Reviewed local resources for support.  - Patient does not use tobacco. - hx of drug use? No    2. Infant care and feeding:  -Patient currently breastmilk feeding? Yes Reviewed importance of  draining breast regularly to support lactation. -Social determinants of health (SDOH) reviewed in EPIC. No concerns  3. Sexuality, contraception and birth spacing - Patient does not want a pregnancy in the next year.  Desired family size is 2 children.  - Reviewed forms of contraception in tiered fashion. Patient desired Depo-Provera today.   - Discussed birth spacing of 18 months  4. Sleep and fatigue -Encouraged family/partner/community support of 4 hrs of uninterrupted sleep to help with mood and fatigue  5. Physical Recovery  - Discussed patients delivery and complications - Patient has urinary  incontinence? No - Patient is safe to resume physical and sexual activity- discussed with patient to abstain from intercourse until vaginal bleeding stopped, patient verbalizes understanding   Sharyon Cable, CNM [facility] [employer]

## 2020-04-26 NOTE — Patient Instructions (Signed)

## 2020-05-14 ENCOUNTER — Other Ambulatory Visit: Payer: Self-pay | Admitting: Certified Nurse Midwife

## 2020-05-28 ENCOUNTER — Ambulatory Visit (INDEPENDENT_AMBULATORY_CARE_PROVIDER_SITE_OTHER): Payer: Medicaid Other | Admitting: Licensed Clinical Social Worker

## 2020-05-28 DIAGNOSIS — F39 Unspecified mood [affective] disorder: Secondary | ICD-10-CM

## 2020-05-28 NOTE — BH Specialist Note (Signed)
Integrated Behavioral Health via Telemedicine Visit  05/28/2020 Victoria Garrett [id number]  Number of Integrated Behavioral Health visits: 2/6 Session Start time: 9:10am   Session End time: 9:28am Total time: 18 mins via mychart video   Referring Provider:  Patient/Family location: Home  [facility] Provider location: [facility]  All persons participating in visit: Pt S. Garrett and LCSWA A. Kora Groom  Types of Service: Individual psychotherapy   I connected with Victoria Garrett and/or Victoria Garrett's n/a via  Telephone or Engineer, civil (consulting)  (Video is Surveyor, mining) and verified that I am speaking with the correct person using two identifiers. Discussed confidentiality: yes   I discussed the limitations of telemedicine and the availability of in person appointments.  Discussed there is a possibility of technology failure and discussed alternative modes of communication if that failure occurs.  I discussed that engaging in this telemedicine visit, they consent to the provision of behavioral healthcare and the services will be billed under their insurance.  Patient and/or legal guardian expressed understanding and consented to Telemedicine visit: yes   Presenting Concerns: Patient and/or family reports the following symptoms/concerns: mood swings, depressed mood, social isolation,and  irritability  Duration of problem: approx 4 weeks ; Severity of problem: moderate   Patient and/or Family's Strengths/Protective Factors: Secured housing and social supports such as case mgrs   Goals Addressed: Patient will: 1.  Reduce symptoms of: post partum depression   2.  Increase knowledge and/or ability of: implement coping skills to alleviate symptoms   3.  Demonstrate ability to: self manage symptoms   Progress towards Goals: Ongoing   Interventions: Interventions utilized:  Supportive counseling  Standardized Assessments  completed:  Flowsheet Row Integrated Behavioral Health from 05/28/2020 in [facility]  PHQ-9 Total Score 13      Assessment: Patient currently experiencing postpartum depression   Patient may benefit from integrated behavioral health   Plan: 1. Follow up with behavioral health clinician on : 3 weeks via mychart  2. Behavioral recommendations: prioritize rest, schedule time for self care, decrease social isolation by taking walks, join a support group, keep medical appts, and set smart goals  3. Referral(s): integrated behavioral health   I discussed the assessment and treatment plan with the patient and/or parent/guardian. They were provided an opportunity to ask questions and all were answered. They agreed with the plan and demonstrated an understanding of the instructions.   They were advised to call back or seek an in-person evaluation if the symptoms worsen or if the condition fails to improve as anticipated.  Gwyndolyn Saxon, LCSW

## 2020-06-21 ENCOUNTER — Ambulatory Visit: Payer: Medicaid Other

## 2020-06-25 ENCOUNTER — Telehealth: Payer: Self-pay

## 2020-07-05 ENCOUNTER — Ambulatory Visit (INDEPENDENT_AMBULATORY_CARE_PROVIDER_SITE_OTHER): Payer: Medicaid Other

## 2020-07-05 ENCOUNTER — Other Ambulatory Visit: Payer: Self-pay

## 2020-07-05 ENCOUNTER — Other Ambulatory Visit (HOSPITAL_COMMUNITY)
Admission: RE | Admit: 2020-07-05 | Discharge: 2020-07-05 | Disposition: A | Discharge status: Home / Self Care | Payer: Medicaid Other | Source: Ambulatory Visit

## 2020-07-05 VITALS — BP 120/79 | HR 81 | Wt 171.0 lb

## 2020-07-05 DIAGNOSIS — N921 Excessive and frequent menstruation with irregular cycle: Secondary | ICD-10-CM | POA: Diagnosis not present

## 2020-07-05 NOTE — Progress Notes (Signed)
Pt had delivery in February and states has been bleeding since.  Pt states she has had some type of bleeding since then. Pt also having cramping.   Pt did receive Depo injection after delivery but did not get next injection.

## 2020-07-05 NOTE — Patient Instructions (Signed)
Abnormal Uterine Bleeding  Abnormal uterine bleeding is unusual bleeding from the uterus. It includes bleeding after sex, or bleeding or spotting between menstrual periods. It may also include bleeding that is heavier than normal, menstrual periods that last longer than usual, or bleeding that occurs after menopause. Abnormal uterine bleeding can affect teenagers, women in their reproductive years, pregnant women, and women who have reached menopause. Common causes of abnormal uterine bleeding include:  Pregnancy.  Growths of tissue (polyps).  Benign tumors or growths in the uterus (fibroids). These are not cancer.  Infection.  Cancer.  Too much or too little of some hormones in the body (hormonal imbalances). Any type of abnormal bleeding should be checked by a health care provider. Many cases are minor and simple to treat, but others may be more serious. Treatment will depend on the cause and severity of the bleeding. Follow these instructions at home: Medicines  Take over-the-counter and prescription medicines only as told by your health care provider.  Tell your health care provider about other medicines that you take. You may be asked to stop taking aspirin or medicines that contain aspirin. These medicines can make bleeding worse.  If you were prescribed iron pills, take them as told by your health care provider. Iron pills help to replace iron that your body loses because of this condition. Managing constipation In cases of severe bleeding, you may be asked to increase your iron intake to treat anemia. This may cause constipation. To prevent or treat constipation, you may need to:  Drink enough fluid to keep your urine pale yellow.  Take over-the-counter or prescription medicines.  Eat foods that are high in fiber, such as beans, whole grains, and fresh fruits and vegetables.  Limit foods that are high in fat and processed sugars, such as fried or sweet foods. General  instructions  Monitor your condition for any changes.  Do not use tampons, douche, or have sex until your health care provider says these things are okay.  Change your pads often.  Get regular exams. This includes pelvic exams and cervical cancer screenings. ? It is up to you to get the results of any tests that are done. Ask your health care provider, or the department that is doing the tests, when your results will be ready.  Keep all follow-up visits as told by your health care provider. This is important. Contact a health care provider if you:  Have bleeding that lasts for more than 1 week.  Feel dizzy at times.  Feel nauseous or you vomit.  Feel light-headed or weak.  Notice any other changes that show that your condition is getting worse. Get help right away if you:  Pass out.  Have bleeding that soaks through a pad every hour.  Have pain in the abdomen.  Have a fever or chills.  Become sweaty or weak.  Pass large blood clots from your vagina. Summary  Abnormal uterine bleeding is unusual bleeding from the uterus.  Any type of abnormal bleeding should be evaluated by a health care provider. Many cases are minor and simple to treat, but others may be more serious.  Treatment will depend on the cause of the bleeding.  Get help right away if you pass out, you have bleeding that soaks through a pad every hour, or you pass large blood clots from your vagina. This information is not intended to replace advice given to you by your health care provider. Make sure you discuss any questions you   have with your health care provider. Document Revised: 10/12/2019 Document Reviewed: 12/07/2018 Elsevier Patient Education  2021 Elsevier Inc.  

## 2020-07-05 NOTE — Progress Notes (Signed)
GYNECOLOGY PROBLEM OFFICE VISIT NOTE  History:  Victoria Garrett is a 20 y.o. [id number] here today for AUB s/t Depo Provera.  She endorses that she delivered in February and received her depo provera injection prior to discharge.  She states she has been bleeding every day since and has been using ~ 1 pad a day.  She states she has intermittent cramping that does not require treatment.  She is not sexually active currently.  She denies any abnormal vaginal discharge, bleeding, pelvic pain or other concerns. She states that she stays at [facility] at [facility] and she is not allowed to have birth control because the establishment is pro-life.  She states that she is not interested in birth control currently.  She expresses concern for an STD stating that the bleeding picked up after she had sex ~ 4 weeks after delivery.  She reports her sexual encounter was unprotected. She requests STD testing today.   Past Medical History:  Diagnosis Date  . Anemia   . Benign gestational thrombocytopenia in third trimester (HCC) 02/28/2020   Plts 135 at new OB, then 127 at 28 weeks Check platelets at 36 week visit [ ]    . Heart murmur    as a child   . Preterm uterine contractions in third trimester, antepartum 02/28/2020  . Supervision of other normal pregnancy, antepartum 02/13/2020    Nursing Staff Provider Office Location [facility]  Dating   6w 5d 02/15/2020 Language  ENGLISH Anatomy US   WNL visible in Care everywhere 10/2019 Flu Vaccine  02/14/2020 Genetic Screen  NIPS:   AFP:   First Screen:  Quad:   TDaP Vaccine   02/14/2020 Hgb A1C or  GTT Normal 1 hour 01/04/2020 per [facility] COVID Vaccine NOT VACCINATED   LAB RESULTS  Rhogam   Blood Type    B POS Feeding Plan BREASTFEED A  . UTI in pregnancy 03/02/2020   Vs bacteriuria. Treated 1/14. [ ]  TOC    Past Surgical History:  Procedure Laterality Date  . IR NEPHROURETERAL CATH PLACE RIGHT Right 10/2015  . NEPHROSTOMY TUBE PLACEMENT ([facility] HX)      The  following portions of the patient's history were reviewed and updated as appropriate: allergies, current medications, past family history, past medical history, past social history, past surgical history and problem list.   Health Maintenance:  No pap d/t age.  No mammogram d/t age.   Review of Systems:  Genito-Urinary ROS: no dysuria, trouble voiding, or hematuria Gastrointestinal ROS: negative Objective:  Vitals: BP 120/79   Pulse 81   Wt 171 lb (77.6 kg)   BMI 31.28 kg/m   Physical Exam: Physical Exam Constitutional:      Appearance: Normal appearance.  HENT:     Head: Normocephalic and atraumatic.  Eyes:     Conjunctiva/sclera: Conjunctivae normal.  Cardiovascular:     Rate and Rhythm: Normal rate.  Pulmonary:     Effort: Pulmonary effort is normal. No respiratory distress.  Musculoskeletal:        General: Normal range of motion.     Cervical back: Normal range of motion.  Neurological:     Mental Status: She is alert and oriented to person, place, and time.  Psychiatric:        Mood and Affect: Mood normal.        Behavior: Behavior normal.        Thought Content: Thought content normal.  Vitals reviewed.  Labs and Imaging: No results found.  Assessment & Plan:  20 year old Abnormal Uterine Bleeding d/t Depo Provera  -Discussed expectation of abnormal bleeding with initial dosing of depo. -Discussed treatment including continued depo usage or initiation of pills.  -Patient reports that since she stays at [facility] she is not supposed to take birth control. -Informed that birth control would be used to help with abnormal bleeding and would be short term. -Patient agreeable. -Given sample of Lo Lo Estrin for usage. Instructed to take daily and that menses should return to normal after completion. -Patient also requests STD testing and will self swab. -Informed that she will be contacted for any abnormal results.    Total face-to-face time with  patient: 15 minutes   Gerrit Heck, CNM 07/05/2020 1:22 PM

## 2020-07-06 LAB — CERVICOVAGINAL ANCILLARY ONLY
Bacterial Vaginitis (gardnerella): POSITIVE — AB
Candida Glabrata: NEGATIVE
Candida Vaginitis: NEGATIVE
Chlamydia: NEGATIVE
Comment: NEGATIVE
Comment: NEGATIVE
Comment: NEGATIVE
Comment: NEGATIVE
Comment: NEGATIVE
Comment: NORMAL
Neisseria Gonorrhea: NEGATIVE
Trichomonas: NEGATIVE

## 2020-07-06 MED ORDER — METRONIDAZOLE 500 MG PO TABS: 500.0000 mg | ORAL_TABLET | Freq: Two times a day (BID) | ORAL | 0 refills | Status: DC

## 2020-07-06 NOTE — Addendum Note (Signed)
Addended by: Gerrit Heck L on: 07/06/2020 10:41 PM   Modules accepted: Orders

## 2020-08-10 ENCOUNTER — Other Ambulatory Visit: Payer: Self-pay

## 2020-08-10 ENCOUNTER — Encounter: Payer: Self-pay | Admitting: Emergency Medicine

## 2020-08-10 ENCOUNTER — Ambulatory Visit
Admission: EM | Admit: 2020-08-10 | Discharge: 2020-08-10 | Disposition: A | Discharge status: Home / Self Care | Payer: Medicaid Other | Attending: Emergency Medicine | Admitting: Emergency Medicine

## 2020-08-10 DIAGNOSIS — R1030 Lower abdominal pain, unspecified: Secondary | ICD-10-CM | POA: Insufficient documentation

## 2020-08-10 DIAGNOSIS — R5383 Other fatigue: Secondary | ICD-10-CM | POA: Insufficient documentation

## 2020-08-10 LAB — POCT URINALYSIS DIP (MANUAL ENTRY)
Bilirubin, UA: NEGATIVE
Glucose, UA: NEGATIVE mg/dL
Leukocytes, UA: NEGATIVE
Nitrite, UA: NEGATIVE
Spec Grav, UA: >=1.03 — AB (ref 1.010–1.025)
Urobilinogen, UA: 0.2 U/dL
pH, UA: 6 (ref 5.0–8.0)

## 2020-08-10 LAB — POCT URINE PREGNANCY: Preg Test, Ur: NEGATIVE

## 2020-08-10 NOTE — ED Provider Notes (Addendum)
[facility]    CSN: [id number] Arrival date & time: 08/10/20  1337      History   Chief Complaint Chief Complaint  Patient presents with   Abdominal Pain    HPI Victoria Garrett is a 20 y.o. female.   Patient has been having fatigue and "joint pain" for 12 days. Mild, sharp lower abdominal pain started about 6 days ago. Denies urinary frequency, urinary burning, vaginal discharge, vaginal itching, or vaginal irritation. Is having some back pain that is present throughout entire back. Was treated for bacterial vaginosis on 5/19 with metronidazole with complete resolution of symptoms. Denies pelvic pain or pain during intercourse. Denies recent sexual activity, known STD exposure, or any risk sexual activity. Is lactating due to current breastfeeding. Gave birth to child in February. Has has 2 periods of menstrual bleeding this month. First menstrual cycle was 5/10 to 5/14. Second period of bleeding started 2 days ago with intermittent heavy bleeding. Was taking Depo injections prior to childbirth with an average of 1 menstrual cycle a year. Is not currently taking any form of birth control. Was given Depo birth control prior to discharge from hospital after giving birth that was suspicious of causing prolonged 13 weeks postpartum bleeding that she was evaluated for on 5/19 per patient. Patient was prescribed birth control pills at that visit to assist with regulating bleeding but patient states that the bleeding stopped so the medication was never started.   Denies nausea, vomiting, diarrhea. Last BM was yesterday and was normal per patient. Denies upper respiratory symptoms or fever. Denies any sick contacts.  Patient is suspicious of UTI because a previous UTI presented with similar symptoms per patient. Patient states that she was unable to get in contact with her OBGYN today as she called them and did not receive a reply.    Abdominal Pain  Past Medical History:   Diagnosis Date   Anemia    Benign gestational thrombocytopenia in third trimester (HCC) 02/28/2020   Plts 135 at new OB, then 127 at 28 weeks Check platelets at 36 week visit [ ]     Heart murmur    as a child    Preterm uterine contractions in third trimester, antepartum 02/28/2020   Supervision of other normal pregnancy, antepartum 02/13/2020    Nursing Staff Provider Office Location [facility]  Dating   6w 5d 02/15/2020 Language  ENGLISH Anatomy US   WNL visible in Care everywhere 10/2019 Flu Vaccine  02/14/2020 Genetic Screen  NIPS:   AFP:   First Screen:  Quad:   TDaP Vaccine   02/14/2020 Hgb A1C or  GTT Normal 1 hour 01/04/2020 per [facility] COVID Vaccine NOT VACCINATED   LAB RESULTS  Rhogam   Blood Type    B POS Feeding Plan BREASTFEED A   UTI in pregnancy 03/02/2020   Vs bacteriuria. Treated 1/14. 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 Authorizing Provider  sertraline (ZOLOFT) 25 MG tablet Take 25 mg by mouth daily.   Yes [provider]  coconut oil OIL Apply 1 application topically as needed. Patient not taking: No sig reported 03/28/20   Cora Collum, DO  ibuprofen (ADVIL) 600 MG tablet Take  1 tablet (600 mg total) by mouth every 6 (six) hours. Patient not taking: No sig reported 03/28/20   Cora Collum, DO  metoCLOPramide (REGLAN) 10 MG tablet Take 1 tablet (10 mg total) by mouth every 8 (eight) hours as needed (headache, nausea, vomiting). 04/26/20   Sharyon Cable, CNM  metroNIDAZOLE (FLAGYL) 500 MG tablet Take 1 tablet (500 mg total) by mouth 2 (two) times daily. 07/06/20   Gerrit Heck, CNM  Prenatal Vit-Fe Fumarate-FA (PRENATAL MULTIVITAMIN) TABS tablet Take 1 tablet by mouth daily at 12 noon. Patient not taking: No sig reported 03/28/20   Cora Collum, DO    Family History Family History  Problem Relation Age of Onset   Healthy Mother    Healthy Father    Asthma Sister    Diabetes Maternal Grandmother     Social History Social History   Tobacco Use   Smoking status: Never   Smokeless tobacco: Never  Vaping Use   Vaping Use: Never used  Substance Use Topics   Alcohol use: Not Currently   Drug use: Never     Allergies   Patient has no known allergies.   Review of Systems Review of Systems Per HPI  Physical Exam Triage Vital Signs ED Triage Vitals  Enc Vitals Group     BP 08/10/20 1448 123/82     Pulse Rate 08/10/20 1448 91     Resp 08/10/20 1448 15     Temp 08/10/20 1448 98.1 F (36.7 C)     Temp Source 08/10/20 1448 Oral     SpO2 08/10/20 1448 98 %     Weight --      Height --      Head Circumference --      Peak Flow --      Pain Score 08/10/20 1446 5     Pain Loc --      Pain Edu? --      Excl. in GC? --    No data found.  Updated Vital Signs BP 123/82 (BP Location: Left Arm)   Pulse 91   Temp 98.1 F (36.7 C) (Oral)   Resp 15   LMP 07/27/2020 (Approximate)   SpO2 98%   Breastfeeding Yes   Visual Acuity Right Eye Distance:   Left Eye Distance:   Bilateral Distance:    Right Eye Near:   Left Eye Near:    Bilateral Near:     Physical Exam Constitutional:      Appearance: Normal appearance.  HENT:     Head:  Normocephalic and atraumatic.  Eyes:     Extraocular Movements: Extraocular movements intact.     Conjunctiva/sclera: Conjunctivae normal.  Pulmonary:  Effort: Pulmonary effort is normal.  Abdominal:     General: Abdomen is flat. Bowel sounds are normal. There is no distension.     Palpations: Abdomen is soft.     Tenderness: There is no abdominal tenderness. There is no guarding.     Comments: Abdomen is soft to palpation and there is no pain on palpation of entire abdomen. Patient states that pain is intermittent.   Genitourinary:    Comments: Deferred. Self swab completed.  Neurological:     General: No focal deficit present.     Mental Status: She is alert and oriented to person, place, and time. Mental status is at baseline.  Psychiatric:        Mood and Affect: Mood normal.        Behavior: Behavior normal.        Thought Content: Thought content normal.        Judgment: Judgment normal.     UC Treatments / Results  Labs (all labs ordered are listed, but only abnormal results are displayed) Labs Reviewed  POCT URINALYSIS DIP (MANUAL ENTRY) - Abnormal; Notable for the following components:      Result Value   Ketones, POC UA trace (5) (*)    Spec Grav, UA >=1.030 (*)    Blood, UA large (*)    Protein Ur, POC trace (*)    All other components within normal limits  URINE CULTURE  POCT URINE PREGNANCY  CERVICOVAGINAL ANCILLARY ONLY    EKG   Radiology No results found.  Procedures Procedures (including critical care time)  Medications Ordered in UC Medications - No data to display  Initial Impression / Assessment and Plan / UC Course  I have reviewed the triage vital signs and the nursing notes.  Pertinent labs & imaging results that were available during my care of the patient were reviewed by me and considered in my medical decision making (see chart for details).     Urine culture and STD vaginal swab pending. Will treat as appropriate according to  lab results. Patient was advised to follow up with OBGYN today or as soon as possible for further evaluation. Patient was advised to go to the hospital if abdominal pain worsens or if any other symptoms occur.  Discussed strict return precautions. Patient verbalized understanding and is agreeable with plan.  Final Clinical Impressions(s) / UC Diagnoses   Final diagnoses:  Lower abdominal pain  Fatigue, unspecified type     Discharge Instructions      May take Tylenol as needed for abdominal pain. Avoid NSAID's, such as aleve and ibuprofen, due to breastfeeding and medication interaction with sertraline. May use heat application for lower abdominal pain comfort as needed for 15 minutes at a time.   Please follow up with OBGYN today for further evaluation and management.  Urine culture and STD test pending. Will call if positive result and treat as appropriate.   Please go to the emergency department if abdominal pain worsens or if other symptoms occur.    ED Prescriptions   None    PDMP not reviewed this encounter.   Lance Muss, FNP 08/10/20 1548    Lance Muss, FNP 08/10/20 [phone]

## 2020-08-10 NOTE — Discharge Instructions (Signed)
May take Tylenol as needed for abdominal pain. Avoid NSAID's, such as aleve and ibuprofen, due to breastfeeding and medication interaction with sertraline. May use heat application for lower abdominal pain comfort as needed for 15 minutes at a time.   Please follow up with OBGYN today for further evaluation and management.  Urine culture and STD test pending. Will call if positive result and treat as appropriate.   Please go to the emergency department if abdominal pain worsens or if other symptoms occur.

## 2020-08-10 NOTE — ED Triage Notes (Signed)
Patient c/o ABD pain x 12 days.   Patient endorses fatigue and generalized body aches.   Patient denies dysuria, hematuria, foul odor, or abnormal vaginal discharge.   Patient denies N/V/D.   Patent endorses bilateral back pain.   Patient endorses spotting.   Patient endorses having a menstrual period twice this month. Patient endorses to currently be lactating.   Patient has used electrolytes, water, cranberry with no relief of symptoms.

## 2020-08-11 LAB — URINE CULTURE: Culture: <10000 — AB

## 2020-08-13 LAB — CERVICOVAGINAL ANCILLARY ONLY
Bacterial Vaginitis (gardnerella): POSITIVE — AB
Candida Glabrata: NEGATIVE
Candida Vaginitis: NEGATIVE
Chlamydia: NEGATIVE
Comment: NEGATIVE
Comment: NEGATIVE
Comment: NEGATIVE
Comment: NEGATIVE
Comment: NEGATIVE
Comment: NORMAL
Neisseria Gonorrhea: NEGATIVE
Trichomonas: NEGATIVE

## 2020-08-14 ENCOUNTER — Telehealth (HOSPITAL_COMMUNITY): Payer: Self-pay | Admitting: Emergency Medicine

## 2020-08-14 MED ORDER — METRONIDAZOLE 0.75 % VA GEL: 1.0000 | Freq: Every day | VAGINAL | 0 refills | Status: AC

## 2020-08-14 NOTE — Telephone Encounter (Signed)
Opened two encounters in error.

## 2020-08-27 ENCOUNTER — Ambulatory Visit: Payer: Medicaid Other | Admitting: Obstetrics

## 2020-09-12 ENCOUNTER — Encounter (HOSPITAL_COMMUNITY): Payer: Self-pay | Admitting: *Deleted

## 2020-09-12 ENCOUNTER — Emergency Department (HOSPITAL_COMMUNITY)
Admission: EM | Admit: 2020-09-12 | Discharge: 2020-09-13 | Disposition: A | Discharge status: Home / Self Care | Payer: Medicaid Other | Attending: Emergency Medicine | Admitting: Emergency Medicine

## 2020-09-12 ENCOUNTER — Other Ambulatory Visit: Payer: Self-pay

## 2020-09-12 DIAGNOSIS — N3000 Acute cystitis without hematuria: Secondary | ICD-10-CM | POA: Diagnosis not present

## 2020-09-12 DIAGNOSIS — Z28310 Unvaccinated for covid-19: Secondary | ICD-10-CM | POA: Diagnosis not present

## 2020-09-12 DIAGNOSIS — R300 Dysuria: Secondary | ICD-10-CM | POA: Diagnosis present

## 2020-09-12 LAB — URINALYSIS, ROUTINE W REFLEX MICROSCOPIC
Bilirubin Urine: NEGATIVE
Glucose, UA: NEGATIVE mg/dL
Ketones, ur: NEGATIVE mg/dL
Nitrite: POSITIVE — AB
Protein, ur: NEGATIVE mg/dL
Specific Gravity, Urine: 1.024 (ref 1.005–1.030)
WBC, UA: >50 WBC/hpf — ABNORMAL HIGH (ref 0–5)
pH: 5 (ref 5.0–8.0)

## 2020-09-12 LAB — CBC WITH DIFFERENTIAL/PLATELET
Abs Immature Granulocytes: 0.05 10*3/uL (ref 0.00–0.07)
Basophils Absolute: 0 10*3/uL (ref 0.0–0.1)
Basophils Relative: 0 %
Eosinophils Absolute: 0.1 10*3/uL (ref 0.0–0.5)
Eosinophils Relative: 1 %
HCT: 40.3 % (ref 36.0–46.0)
Hemoglobin: 13.4 g/dL (ref 12.0–15.0)
Immature Granulocytes: 1 %
Lymphocytes Relative: 29 %
Lymphs Abs: 2.8 10*3/uL (ref 0.7–4.0)
MCH: 28.5 pg (ref 26.0–34.0)
MCHC: 33.3 g/dL (ref 30.0–36.0)
MCV: 85.7 fL (ref 80.0–100.0)
Monocytes Absolute: 0.6 10*3/uL (ref 0.1–1.0)
Monocytes Relative: 6 %
Neutro Abs: 6.1 10*3/uL (ref 1.7–7.7)
Neutrophils Relative %: 63 %
Platelets: 165 10*3/uL (ref 150–400)
RBC: 4.7 MIL/uL (ref 3.87–5.11)
RDW: 12.8 % (ref 11.5–15.5)
WBC: 9.6 10*3/uL (ref 4.0–10.5)
nRBC: 0 % (ref 0.0–0.2)

## 2020-09-12 LAB — BASIC METABOLIC PANEL
Anion gap: 3 — ABNORMAL LOW (ref 5–15)
BUN: 8 mg/dL (ref 6–20)
CO2: 31 mmol/L (ref 22–32)
Calcium: 9.7 mg/dL (ref 8.9–10.3)
Chloride: 102 mmol/L (ref 98–111)
Creatinine, Ser: 0.51 mg/dL (ref 0.44–1.00)
GFR, Estimated: >60 mL/min (ref >60)
Glucose, Bld: 107 mg/dL — ABNORMAL HIGH (ref 70–99)
Potassium: 3.6 mmol/L (ref 3.5–5.1)
Sodium: 136 mmol/L (ref 135–145)

## 2020-09-12 LAB — PREGNANCY, URINE: Preg Test, Ur: NEGATIVE

## 2020-09-12 NOTE — ED Triage Notes (Signed)
Pt with lower abdominal discomfort and urinary symptoms for about 2 days. Denies fevers.

## 2020-09-12 NOTE — ED Provider Notes (Signed)
Emergency Medicine Provider Triage Evaluation Note  Victoria Garrett , a 20 y.o. female  was evaluated in triage.  Pt complains of urinary symptoms, started 2 days ago, has urinary urgency, frequency, dysuria, denies hematuria, vaginal discharge, vaginal bleeding, denies pelvic cramping or pelvic pain, she endorsed she has some stomach pain but she has had this for last 2 to 3 months, states it feels slightly worse.  She also has some lower back pain, she denies nausea, vomit, diarrhea.  She is not on birth control, has not had a menstrual cycle yet.  Review of Systems  Positive: Urinary urgency, frequency Negative: Vaginal discharge, vaginal bleeding  Physical Exam  BP 131/79 (BP Location: Right Arm)   Pulse 88   Temp 99.3 F (37.4 C) (Oral)   Resp 16   SpO2 100%  Gen:   Awake, no distress   Resp:  Normal effort  MSK:   Moves extremities without difficulty  Other:    Medical Decision Making  Medically screening exam initiated at 9:17 PM.  Appropriate orders placed.  Yaira L Lorenzo-Jarquin was informed that the remainder of the evaluation will be completed by another provider, this initial triage assessment does not replace that evaluation, and the importance of remaining in the ED until their evaluation is complete.  Presents with UTI-like symptoms patient need further work-up.   Carroll Sage, PA-C 09/12/20 2118    Margarita Grizzle, MD 09/16/20 [phone]

## 2020-09-13 MED ORDER — CEFTRIAXONE SODIUM 1 G IJ SOLR
1.0000 g | Freq: Once | INTRAMUSCULAR | Status: AC
  Administered 2020-09-13: 1 g via INTRAMUSCULAR
  Filled 2020-09-13: qty 10

## 2020-09-13 MED ORDER — CEPHALEXIN 500 MG PO CAPS: ORAL_CAPSULE | ORAL | 0 refills | Status: DC

## 2020-09-13 MED ORDER — LIDOCAINE HCL (PF) 1 % IJ SOLN
5.0000 mL | Freq: Once | INTRAMUSCULAR | Status: AC
  Administered 2020-09-13: 5 mL via INTRADERMAL
  Filled 2020-09-13: qty 5

## 2020-09-13 NOTE — Discharge Instructions (Addendum)
1. Medications: Keflex, usual home medications °2. Treatment: rest, drink plenty of fluids, take medications as prescribed °3. Follow Up: Please followup with your primary doctor in 3 days for discussion of your diagnoses and further evaluation after today's visit; if you do not have a primary care doctor use the resource guide provided to find one; return to the ER for fevers, persistent vomiting, worsening abdominal pain or other concerning symptoms. ° °

## 2020-09-13 NOTE — ED Provider Notes (Signed)
[facility] EMERGENCY DEPARTMENT Provider Note   CSN: [id number] Arrival date & time: 09/12/20  1954     History Chief Complaint  Patient presents with   Abdominal Pain    Victoria Garrett is a 20 y.o. female presents to the Emergency Department complaining of gradual, persistent, progressively worsening dysuria onset 2 days ago.  Pt reports she developed abd pain and low back pain in the last 24 hours.  Associated urinary urgency and frequency but no gross hematuria.No treatments PTA.  No aggravating or alleviating factors.  Pt reports hx of pyelonephritis and wanted to be seen before the infection became worse.    The history is provided by the patient and medical records. No language interpreter was used.      Past Medical History:  Diagnosis Date   Anemia    Benign gestational thrombocytopenia in third trimester (HCC) 02/28/2020   Plts 135 at new OB, then 127 at 28 weeks Check platelets at 36 week visit [ ]     Heart murmur    as a child    Preterm uterine contractions in third trimester, antepartum 02/28/2020   Supervision of other normal pregnancy, antepartum 02/13/2020    Nursing Staff Provider Office Location [facility]  Dating   6w 5d 02/15/2020 Language  ENGLISH Anatomy US   WNL visible in Care everywhere 10/2019 Flu Vaccine  02/14/2020 Genetic Screen  NIPS:   AFP:   First Screen:  Quad:   TDaP Vaccine   02/14/2020 Hgb A1C or  GTT Normal 1 hour 01/04/2020 per [facility] COVID Vaccine NOT VACCINATED   LAB RESULTS  Rhogam   Blood Type    B POS Feeding Plan BREASTFEED A   UTI in pregnancy 03/02/2020   Vs bacteriuria. Treated 1/14. [ ]  TOC    Patient Active Problem List   Diagnosis Date Noted   Deliberate self-cutting 03/20/2020   Generalized anxiety disorder with panic attacks 02/14/2020   Major depressive disorder affecting pregnancy 02/14/2020   Poor social situation 02/14/2020   History of pyelonephritis during pregnancy 02/14/2020   PTSD  (post-traumatic stress disorder) 03/03/2018   Hydronephrosis, right 10/03/2015    Past Surgical History:  Procedure Laterality Date   IR NEPHROURETERAL CATH PLACE RIGHT Right 10/2015   NEPHROSTOMY TUBE PLACEMENT ([facility] HX)       OB History     Gravida  2   Para  2   Term  2   Preterm      AB      Living  2      SAB      IAB      Ectopic      Multiple  0   Live Births  2           Family History  Problem Relation Age of Onset   Healthy Mother    Healthy Father    Asthma Sister    Diabetes Maternal Grandmother     Social History   Tobacco Use   Smoking status: Never   Smokeless tobacco: Never  Vaping Use   Vaping Use: Never used  Substance Use Topics   Alcohol use: Not Currently   Drug use: Never    Home Medications Prior to Admission medications   Medication Sig Start Date End Date Taking? Authorizing Provider  cephALEXin (KEFLEX) 500 MG capsule 1 cap po bid x 7 days 09/13/20  Yes Melda Mermelstein, 11/2015, PA-C  coconut oil OIL Apply 1 application topically as  needed. Patient not taking: No sig reported 03/28/20   Cora Collum, DO  ibuprofen (ADVIL) 600 MG tablet Take 1 tablet (600 mg total) by mouth every 6 (six) hours. Patient not taking: No sig reported 03/28/20   Cora Collum, DO  metoCLOPramide (REGLAN) 10 MG tablet Take 1 tablet (10 mg total) by mouth every 8 (eight) hours as needed (headache, nausea, vomiting). 04/26/20   Sharyon Cable, CNM  Prenatal Vit-Fe Fumarate-FA (PRENATAL MULTIVITAMIN) TABS tablet Take 1 tablet by mouth daily at 12 noon. Patient not taking: No sig reported 03/28/20   Cora Collum, DO  sertraline (ZOLOFT) 25 MG tablet Take 25 mg by mouth daily.    [provider]    Allergies    Patient has no known allergies.  Review of Systems   Review of Systems  Constitutional:  Negative for appetite change, diaphoresis, fatigue, fever and unexpected weight change.  HENT:  Negative for mouth sores.    Eyes:  Negative for visual disturbance.  Respiratory:  Negative for cough, chest tightness, shortness of breath and wheezing.   Cardiovascular:  Negative for chest pain.  Gastrointestinal:  Positive for abdominal pain. Negative for constipation, diarrhea, nausea and vomiting.  Endocrine: Negative for polydipsia, polyphagia and polyuria.  Genitourinary:  Positive for dysuria, frequency and urgency. Negative for hematuria.  Musculoskeletal:  Positive for back pain (low). Negative for neck stiffness.  Skin:  Negative for rash.  Allergic/Immunologic: Negative for immunocompromised state.  Neurological:  Negative for syncope, light-headedness and headaches.  Hematological:  Does not bruise/bleed easily.  Psychiatric/Behavioral:  Negative for sleep disturbance. The patient is not nervous/anxious.    Physical Exam Updated Vital Signs BP (!) 120/102   Pulse 86   Temp 99.3 F (37.4 C) (Oral)   Resp 16   SpO2 97%   Physical Exam Vitals and nursing note reviewed.  Constitutional:      General: She is not in acute distress.    Appearance: She is not diaphoretic.  HENT:     Head: Normocephalic.  Eyes:     General: No scleral icterus.    Conjunctiva/sclera: Conjunctivae normal.  Cardiovascular:     Rate and Rhythm: Normal rate and regular rhythm.     Pulses: Normal pulses.          Radial pulses are 2+ on the right side and 2+ on the left side.  Pulmonary:     Effort: No tachypnea, accessory muscle usage, prolonged expiration, respiratory distress or retractions.     Breath sounds: No stridor.     Comments: Equal chest rise. No increased work of breathing. Abdominal:     General: There is no distension.     Palpations: Abdomen is soft.     Tenderness: There is abdominal tenderness in the suprapubic area. There is no right CVA tenderness, left CVA tenderness, guarding or rebound.  Musculoskeletal:     Cervical back: Normal range of motion.     Comments: Moves all extremities equally  and without difficulty.  Skin:    General: Skin is warm and dry.     Capillary Refill: Capillary refill takes less than 2 seconds.  Neurological:     Mental Status: She is alert.     GCS: GCS eye subscore is 4. GCS verbal subscore is 5. GCS motor subscore is 6.     Comments: Speech is clear and goal oriented.  Psychiatric:        Mood and Affect: Mood normal.  ED Results / Procedures / Treatments   Labs (all labs ordered are listed, but only abnormal results are displayed) Labs Reviewed  BASIC METABOLIC PANEL - Abnormal; Notable for the following components:      Result Value   Glucose, Bld 107 (*)    Anion gap 3 (*)    All other components within normal limits  URINALYSIS, ROUTINE W REFLEX MICROSCOPIC - Abnormal; Notable for the following components:   APPearance HAZY (*)    Hgb urine dipstick SMALL (*)    Nitrite POSITIVE (*)    Leukocytes,Ua MODERATE (*)    WBC, UA >50 (*)    Bacteria, UA RARE (*)    All other components within normal limits  URINE CULTURE  CBC WITH DIFFERENTIAL/PLATELET  PREGNANCY, URINE     Procedures Procedures   Medications Ordered in ED Medications  cefTRIAXone (ROCEPHIN) injection 1 g (has no administration in time range)  lidocaine (PF) (XYLOCAINE) 1 % injection 5 mL (has no administration in time range)    ED Course  I have reviewed the triage vital signs and the nursing notes.  Pertinent labs & imaging results that were available during my care of the patient were reviewed by me and considered in my medical decision making (see chart for details).    MDM Rules/Calculators/A&P                           Pt has been diagnosed with a UTI. Pt is afebrile, no CVA tenderness, normotensive, and denies N/V. Pt to be dc home with antibiotics and instructions to follow up with PCP if symptoms persist. Pt concerned about not being able to get her abx for a day or more due to transportation issues.  Rocephin IM given in the ED.   Final  Clinical Impression(s) / ED Diagnoses Final diagnoses:  Acute cystitis without hematuria    Rx / DC Orders ED Discharge Orders          Ordered    cephALEXin (KEFLEX) 500 MG capsule        09/13/20 0457             Catera Hankins, Dahlia Client, PA-C 09/13/20 0502    Zadie Rhine, MD 09/13/20 [phone]

## 2020-09-15 LAB — URINE CULTURE: Culture: >=100000 — AB

## 2020-09-16 ENCOUNTER — Telehealth: Payer: Self-pay | Admitting: Emergency Medicine

## 2020-09-16 NOTE — Telephone Encounter (Signed)
Post ED Visit - Positive Culture Follow-up  Culture report reviewed by antimicrobial stewardship pharmacist: [facility] []  , Pharm.D. []  Enzo Bi, Pharm.D., BCPS AQ-ID []  , Pharm.D., BCPS []  Celedonio Miyamoto, Pharm.D., BCPS []  [place], Garvin Fila.D., BCPS, AAHIVP []  , Pharm.D., BCPS, AAHIVP []  Georgina Pillion, PharmD, BCPS []  , PharmD, BCPS []  [place], PharmD, BCPS []  [place], PharmD []  , PharmD, BCPS []  Estella Husk, PharmD  Pharmacy Team []  Lysle Pearl, PharmD []  , PharmD []  Phillips Climes, PharmD []  , Rph []  Agapito Games) , PharmD []  Verlan Friends, PharmD []  , PharmD []  Mervyn Gay, PharmD []  , PharmD []  Vinnie Level, PharmD []  [facility], PharmD []  , PharmD []  Len Childs, PharmD PharmD   Positive urine culture Treated with cephalexin, organism sensitive to the same and no further patient follow-up is required at this time.  Greer Pickerel 09/16/2020, 2:52 PM

## 2020-10-10 ENCOUNTER — Ambulatory Visit: Payer: Medicaid Other

## 2020-10-16 ENCOUNTER — Ambulatory Visit: Payer: Medicaid Other

## 2020-12-04 ENCOUNTER — Emergency Department (HOSPITAL_COMMUNITY)
Admission: EM | Admit: 2020-12-04 | Discharge: 2020-12-05 | Discharge status: Against Medical Advice | Payer: Medicaid Other | Attending: Student | Admitting: Student

## 2020-12-04 ENCOUNTER — Other Ambulatory Visit: Payer: Self-pay

## 2020-12-04 ENCOUNTER — Encounter (HOSPITAL_COMMUNITY): Payer: Self-pay

## 2020-12-04 ENCOUNTER — Emergency Department (HOSPITAL_COMMUNITY): Payer: Medicaid Other

## 2020-12-04 DIAGNOSIS — Z5321 Procedure and treatment not carried out due to patient leaving prior to being seen by health care provider: Secondary | ICD-10-CM | POA: Insufficient documentation

## 2020-12-04 DIAGNOSIS — R509 Fever, unspecified: Secondary | ICD-10-CM | POA: Insufficient documentation

## 2020-12-04 DIAGNOSIS — R1030 Lower abdominal pain, unspecified: Secondary | ICD-10-CM | POA: Insufficient documentation

## 2020-12-04 LAB — CBC
HCT: 44.7 % (ref 36.0–46.0)
Hemoglobin: 15.2 g/dL — ABNORMAL HIGH (ref 12.0–15.0)
MCH: 28.5 pg (ref 26.0–34.0)
MCHC: 34 g/dL (ref 30.0–36.0)
MCV: 83.9 fL (ref 80.0–100.0)
Platelets: 145 10*3/uL — ABNORMAL LOW (ref 150–400)
RBC: 5.33 MIL/uL — ABNORMAL HIGH (ref 3.87–5.11)
RDW: 12.4 % (ref 11.5–15.5)
WBC: 18 10*3/uL — ABNORMAL HIGH (ref 4.0–10.5)
nRBC: 0 % (ref 0.0–0.2)

## 2020-12-04 LAB — COMPREHENSIVE METABOLIC PANEL
ALT: 53 U/L — ABNORMAL HIGH (ref 0–44)
AST: 23 U/L (ref 15–41)
Albumin: 5.1 g/dL — ABNORMAL HIGH (ref 3.5–5.0)
Alkaline Phosphatase: 64 U/L (ref 38–126)
Anion gap: 11 (ref 5–15)
BUN: 7 mg/dL (ref 6–20)
CO2: 23 mmol/L (ref 22–32)
Calcium: 9.8 mg/dL (ref 8.9–10.3)
Chloride: 100 mmol/L (ref 98–111)
Creatinine, Ser: 0.63 mg/dL (ref 0.44–1.00)
GFR, Estimated: >60 mL/min (ref >60)
Glucose, Bld: 98 mg/dL (ref 70–99)
Potassium: 3.4 mmol/L — ABNORMAL LOW (ref 3.5–5.1)
Sodium: 134 mmol/L — ABNORMAL LOW (ref 135–145)
Total Bilirubin: 1 mg/dL (ref 0.3–1.2)
Total Protein: 8.3 g/dL — ABNORMAL HIGH (ref 6.5–8.1)

## 2020-12-04 LAB — I-STAT BETA HCG BLOOD, ED (MC, WL, AP ONLY): I-stat hCG, quantitative: <5 m[IU]/mL (ref <5)

## 2020-12-04 LAB — LIPASE, BLOOD: Lipase: 24 U/L (ref 11–51)

## 2020-12-04 MED ORDER — ACETAMINOPHEN 325 MG PO TABS
650.0000 mg | ORAL_TABLET | Freq: Once | ORAL | Status: AC
  Administered 2020-12-04: 650 mg via ORAL
  Filled 2020-12-04: qty 2

## 2020-12-04 MED ORDER — IOHEXOL 350 MG/ML SOLN
80.0000 mL | Freq: Once | INTRAVENOUS | Status: AC | PRN
  Administered 2020-12-04: 80 mL via INTRAVENOUS

## 2020-12-04 MED ORDER — ONDANSETRON 4 MG PO TBDP
4.0000 mg | ORAL_TABLET | Freq: Once | ORAL | Status: AC
  Administered 2020-12-04: 4 mg via ORAL
  Filled 2020-12-04: qty 1

## 2020-12-04 NOTE — ED Provider Notes (Addendum)
Emergency Medicine Provider Triage Evaluation Note  Victoria Garrett , a 20 y.o. female  was evaluated in triage.  Pt complains of abdominal pain, nausea and vomiting that began earlier today.  She woke up and felt as though her period may be starting however it never did.  Has missed her last 2 menses.  Had a negative pregnancy test earlier today.  Review of Systems  Positive: Nausea, vomiting and abdominal pain Negative: Urinary symptoms  Physical Exam  BP 140/71   Pulse (!) 125   Temp (!) 102.2 F (39 C)   Resp 20   Ht 5\' 1"  (1.549 m)   Wt 77.1 kg   LMP 10/09/2020   SpO2 97%   BMI 32.12 kg/m  Gen:   Awake, no distress   Resp:  Normal effort  MSK:   Moves extremities without difficulty  Other:    Medical Decision Making  Medically screening exam initiated at 5:35 PM.  Appropriate orders placed.  Victoria Garrett was informed that the remainder of the evaluation will be completed by another provider, this initial triage assessment does not replace that evaluation, and the importance of remaining in the ED until their evaluation is complete.     10/11/2020, PA-C 12/04/20 1737    12/06/20 12/04/20 1738    12/06/20, MD 12/04/20 2201

## 2020-12-04 NOTE — ED Triage Notes (Signed)
Pt reports sudden onset of lower abd pain since last night, felt like labor pains. Fever with chills. Went to pregnancy center and had a negative pregnancy test. Pt also reports some sob due to the pain.

## 2020-12-12 ENCOUNTER — Other Ambulatory Visit (HOSPITAL_COMMUNITY)
Admission: RE | Admit: 2020-12-12 | Discharge: 2020-12-12 | Disposition: A | Discharge status: Home / Self Care | Payer: Medicaid Other | Source: Ambulatory Visit | Attending: Obstetrics | Admitting: Obstetrics

## 2020-12-12 ENCOUNTER — Other Ambulatory Visit: Payer: Self-pay

## 2020-12-12 ENCOUNTER — Ambulatory Visit (INDEPENDENT_AMBULATORY_CARE_PROVIDER_SITE_OTHER): Payer: Medicaid Other | Admitting: Obstetrics

## 2020-12-12 ENCOUNTER — Encounter: Payer: Self-pay | Admitting: Obstetrics

## 2020-12-12 VITALS — BP 122/77 | HR 85 | Ht 62.0 in | Wt 175.0 lb

## 2020-12-12 DIAGNOSIS — Z01419 Encounter for gynecological examination (general) (routine) without abnormal findings: Secondary | ICD-10-CM

## 2020-12-12 DIAGNOSIS — N3000 Acute cystitis without hematuria: Secondary | ICD-10-CM | POA: Diagnosis not present

## 2020-12-12 DIAGNOSIS — F419 Anxiety disorder, unspecified: Secondary | ICD-10-CM

## 2020-12-12 DIAGNOSIS — N760 Acute vaginitis: Secondary | ICD-10-CM | POA: Diagnosis not present

## 2020-12-12 DIAGNOSIS — N926 Irregular menstruation, unspecified: Secondary | ICD-10-CM

## 2020-12-12 DIAGNOSIS — F322 Major depressive disorder, single episode, severe without psychotic features: Secondary | ICD-10-CM

## 2020-12-12 DIAGNOSIS — Z3009 Encounter for other general counseling and advice on contraception: Secondary | ICD-10-CM

## 2020-12-12 DIAGNOSIS — B9689 Other specified bacterial agents as the cause of diseases classified elsewhere: Secondary | ICD-10-CM

## 2020-12-12 DIAGNOSIS — D696 Thrombocytopenia, unspecified: Secondary | ICD-10-CM

## 2020-12-12 LAB — POCT URINALYSIS DIPSTICK
Bilirubin, UA: NEGATIVE
Blood, UA: NEGATIVE
Glucose, UA: NEGATIVE
Ketones, UA: POSITIVE
Nitrite, UA: NEGATIVE
Protein, UA: POSITIVE — AB
Spec Grav, UA: >=1.03 — AB (ref 1.010–1.025)
Urobilinogen, UA: NEGATIVE E.U./dL — AB
pH, UA: 5 (ref 5.0–8.0)

## 2020-12-12 MED ORDER — HYDROXYZINE HCL 50 MG PO TABS: 50.0000 mg | ORAL_TABLET | Freq: Every day | ORAL | 1 refills | Status: DC

## 2020-12-12 MED ORDER — SERTRALINE HCL 50 MG PO TABS: 50.0000 mg | ORAL_TABLET | Freq: Every day | ORAL | 11 refills | Status: DC

## 2020-12-12 MED ORDER — CEFUROXIME AXETIL 500 MG PO TABS: 500.0000 mg | ORAL_TABLET | Freq: Two times a day (BID) | ORAL | 0 refills | Status: DC

## 2020-12-12 MED ORDER — METRONIDAZOLE 500 MG PO TABS: 500.0000 mg | ORAL_TABLET | Freq: Two times a day (BID) | ORAL | 2 refills | Status: DC

## 2020-12-12 MED ORDER — HYDROXYZINE HCL 50 MG PO TABS: 50.0000 mg | ORAL_TABLET | Freq: Every day | ORAL | 11 refills | Status: DC

## 2020-12-12 NOTE — Progress Notes (Signed)
Subjective:        Victoria Garrett is a 20 y.o. female here for a routine exam.  Current complaints: Fatigue and urinary frequency.    Personal health questionnaire:  Is patient Ashkenazi Jewish, have a family history of breast and/or ovarian cancer: no Is there a family history of uterine cancer diagnosed at age < 97, gastrointestinal cancer, urinary tract cancer, family member who is a Personnel officer syndrome-associated carrier: no Is the patient overweight and hypertensive, family history of diabetes, personal history of gestational diabetes, preeclampsia or PCOS: no Is patient over 71, have PCOS,  family history of premature CHD under age 44, diabetes, smoke, have hypertension or peripheral artery disease:  no At any time, has a partner hit, kicked or otherwise hurt or frightened you?: no Over the past 2 weeks, have you felt down, depressed or hopeless?: no Over the past 2 weeks, have you felt little interest or pleasure in doing things?:no   Gynecologic History Patient's last menstrual period was 12/06/2020 (exact date). Contraception: none Last Pap: n/a. Results were: n/a Last mammogram: n/a. Results were: n/a  Obstetric History OB History  Gravida Para Term Preterm AB Living  2 2 2     2   SAB IAB Ectopic Multiple Live Births        0 2    # Outcome Date GA Lbr Len/2nd Weight Sex Delivery Anes PTL Lv  2 Term 03/27/20 [redacted]w[redacted]d 01:22 / 00:20 7 lb 4.8 oz (3.311 kg) F Vag-Spont EPI  LIV  1 Term 02/05/16    M Vag-Spont   LIV    Past Medical History:  Diagnosis Date   Anemia    Benign gestational thrombocytopenia in third trimester (HCC) 02/28/2020   Plts 135 at new OB, then 127 at 28 weeks Check platelets at 36 week visit [ ]     Heart murmur    as a child    Preterm uterine contractions in third trimester, antepartum 02/28/2020   Supervision of other normal pregnancy, antepartum 02/13/2020    Nursing Staff Provider Office Location [facility]  Dating   6w 5d 04/27/2020 Language   ENGLISH Anatomy 02/15/2020   WNL visible in Care everywhere 10/2019 Flu Vaccine  02/14/2020 Genetic Screen  NIPS:   AFP:   First Screen:  Quad:   TDaP Vaccine   02/14/2020 Hgb A1C or  GTT Normal 1 hour 01/04/2020 per [facility] COVID Vaccine NOT VACCINATED   LAB RESULTS  Rhogam   Blood Type    B POS Feeding Plan BREASTFEED A   UTI in pregnancy 03/02/2020   Vs bacteriuria. Treated 1/14. [ ]  TOC    Past Surgical History:  Procedure Laterality Date   IR NEPHROURETERAL CATH PLACE RIGHT Right 10/2015   NEPHROSTOMY TUBE PLACEMENT ([facility] HX)       Current Outpatient Medications:    cefUROXime (CEFTIN) 500 MG tablet, Take 1 tablet (500 mg total) by mouth 2 (two) times daily with a meal., Disp: 14 tablet, Rfl: 0   metroNIDAZOLE (FLAGYL) 500 MG tablet, Take 1 tablet (500 mg total) by mouth 2 (two) times daily., Disp: 14 tablet, Rfl: 2   sertraline (ZOLOFT) 50 MG tablet, Take 1 tablet (50 mg total) by mouth daily., Disp: 30 tablet, Rfl: 11   cephALEXin (KEFLEX) 500 MG capsule, 1 cap po bid x 7 days (Patient not taking: Reported on 12/12/2020), Disp: 14 capsule, Rfl: 0   coconut oil OIL, Apply 1 application topically as needed. (Patient not taking: No  sig reported), Disp: , Rfl: 0   hydrOXYzine (ATARAX/VISTARIL) 50 MG tablet, Take 1 tablet (50 mg total) by mouth daily., Disp: 30 tablet, Rfl: 1   ibuprofen (ADVIL) 600 MG tablet, Take 1 tablet (600 mg total) by mouth every 6 (six) hours. (Patient not taking: No sig reported), Disp: 30 tablet, Rfl: 0   metoCLOPramide (REGLAN) 10 MG tablet, Take 1 tablet (10 mg total) by mouth every 8 (eight) hours as needed (headache, nausea, vomiting). (Patient not taking: Reported on 12/12/2020), Disp: 30 tablet, Rfl: 0   Prenatal Vit-Fe Fumarate-FA (PRENATAL MULTIVITAMIN) TABS tablet, Take 1 tablet by mouth daily at 12 noon. (Patient not taking: No sig reported), Disp: , Rfl:  No Known Allergies  Social History   Tobacco Use   Smoking status: Never   Smokeless tobacco: Never   Substance Use Topics   Alcohol use: Not Currently    Family History  Problem Relation Age of Onset   Healthy Mother    Healthy Father    Asthma Sister    Diabetes Maternal Grandmother       Review of Systems  Constitutional: negative for fatigue and weight loss Respiratory: negative for cough and wheezing Cardiovascular: negative for chest pain, fatigue and palpitations Gastrointestinal: negative for abdominal pain and change in bowel habits Musculoskeletal:negative for myalgias Neurological: negative for gait problems and tremors Behavioral/Psych: negative for abusive relationship, depression Endocrine: negative for temperature intolerance    Genitourinary:negative for abnormal menstrual periods, genital lesions, hot flashes, sexual problems and vaginal discharge Integument/breast: negative for breast lump, breast tenderness, nipple discharge and skin lesion(s)    Objective:       BP 122/77   Pulse 85   Ht 5\' 2"  (1.575 m)   Wt 175 lb (79.4 kg)   LMP 12/06/2020 (Exact Date)   Breastfeeding No   BMI 32.01 kg/m  General:   Alert and no distress  Skin:   no rash or abnormalities  Lungs:   clear to auscultation bilaterally  Heart:   regular rate and rhythm, S1, S2 normal, no murmur, click, rub or gallop  Breasts:   normal without suspicious masses, skin or nipple changes or axillary nodes  Abdomen:  normal findings: no organomegaly, soft, non-tender and no hernia  Pelvis:  External genitalia: normal general appearance Urinary system: urethral meatus normal and bladder without fullness, nontender Vaginal: normal without tenderness, induration or masses Cervix: normal appearance Adnexa: normal bimanual exam Uterus: anteverted and non-tender, normal size   Lab Review Urine pregnancy test Labs reviewed yes Radiologic studies reviewed no  I have spent a total of 20 minutes of face-to-face and time, excluding clinical staff time, reviewing notes and preparing to see  patient, ordering tests and/or medications, and counseling the patient.    Assessment:    1. Encounter for routine gynecological examination with Papanicolaou smear of cervix Rx: - Cytology - PAP( [place]) - CBC with Differential/Platelet - Ferritin - Hemoglobin A1c - Comprehensive metabolic panel  2. Missed period Rx: - POCT Urinalysis Dipstick - Urine Culture  3. BV (bacterial vaginosis) Rx: - metroNIDAZOLE (FLAGYL) 500 MG tablet; Take 1 tablet (500 mg total) by mouth 2 (two) times daily.  Dispense: 14 tablet; Refill: 2  4. Acute cystitis without hematuria Rx: - cefUROXime (CEFTIN) 500 MG tablet; Take 1 tablet (500 mg total) by mouth 2 (two) times daily with a meal.  Dispense: 14 tablet; Refill: 0  5. Thrombocytopenia (HCC) Rx: - Ambulatory referral to Hematology / Oncology  6. Anxiety  Rx: - hydrOXYzine (ATARAX/VISTARIL) 50 MG tablet; Take 1 tablet (50 mg total) by mouth daily.  Dispense: 30 tablet; Refill: 1  7. Current severe episode of major depressive disorder without psychotic features, unspecified whether recurrent (HCC) Rx: - sertraline (ZOLOFT) 50 MG tablet; Take 1 tablet (50 mg total) by mouth daily.  Dispense: 30 tablet; Refill: 11  8. Encounter for counseling regarding contraception - declines contraception    Plan:    Education reviewed: calcium supplements, depression evaluation, low fat, low cholesterol diet, safe sex/STD prevention, self breast exams, and weight bearing exercise. Contraception: abstinence. Follow up in: 1 week.     Brock Bad, MD 12/12/2020 6:58 PM

## 2020-12-12 NOTE — Progress Notes (Signed)
GYN presents for missed period in Sept.  HCG done 12/04/20 <5.0.  c/o vagina smelling sweet, urinary frequency x 3-4 weeks.  Denies discharge, itching, burning.

## 2020-12-13 ENCOUNTER — Telehealth: Payer: Self-pay | Admitting: Hematology

## 2020-12-13 LAB — COMPREHENSIVE METABOLIC PANEL
ALT: 59 IU/L — ABNORMAL HIGH (ref 0–32)
AST: 27 IU/L (ref 0–40)
Albumin/Globulin Ratio: 1.7 (ref 1.2–2.2)
Albumin: 4.7 g/dL (ref 3.9–5.0)
Alkaline Phosphatase: 67 IU/L (ref 44–121)
BUN/Creatinine Ratio: 16 (ref 9–23)
BUN: 9 mg/dL (ref 6–20)
Bilirubin Total: <0.2 mg/dL (ref 0.0–1.2)
CO2: 23 mmol/L (ref 20–29)
Calcium: 9.8 mg/dL (ref 8.7–10.2)
Chloride: 101 mmol/L (ref 96–106)
Creatinine, Ser: 0.57 mg/dL (ref 0.57–1.00)
Globulin, Total: 2.7 g/dL (ref 1.5–4.5)
Glucose: 82 mg/dL (ref 70–99)
Potassium: 4.1 mmol/L (ref 3.5–5.2)
Sodium: 140 mmol/L (ref 134–144)
Total Protein: 7.4 g/dL (ref 6.0–8.5)
eGFR: 133 mL/min/{1.73_m2} (ref >59)

## 2020-12-13 LAB — HEMOGLOBIN A1C
Est. average glucose Bld gHb Est-mCnc: 111 mg/dL
Hgb A1c MFr Bld: 5.5 % (ref 4.8–5.6)

## 2020-12-13 LAB — FERRITIN: Ferritin: 67 ng/mL (ref 15–150)

## 2020-12-13 NOTE — Telephone Encounter (Signed)
Scheduled appt per 10/26 referral. Pt aware of appt date and time.  

## 2020-12-18 LAB — URINE CULTURE: Organism ID, Bacteria: NO GROWTH

## 2020-12-19 ENCOUNTER — Telehealth (INDEPENDENT_AMBULATORY_CARE_PROVIDER_SITE_OTHER): Payer: Medicaid Other | Admitting: Obstetrics

## 2020-12-19 ENCOUNTER — Encounter: Payer: Self-pay | Admitting: Obstetrics

## 2020-12-19 DIAGNOSIS — F419 Anxiety disorder, unspecified: Secondary | ICD-10-CM | POA: Diagnosis not present

## 2020-12-19 DIAGNOSIS — R300 Dysuria: Secondary | ICD-10-CM

## 2020-12-19 DIAGNOSIS — D696 Thrombocytopenia, unspecified: Secondary | ICD-10-CM | POA: Diagnosis not present

## 2020-12-19 NOTE — Progress Notes (Signed)
GYNECOLOGY VIRTUAL VISIT ENCOUNTER NOTE  Provider location: [facility] for [facility]   Patient location: Home  I connected with Victoria Garrett on 12/19/20 at  1:30 PM EDT by MyChart Video Encounter and verified that I am speaking with the correct person using two identifiers.   I discussed the limitations, risks, security and privacy concerns of performing an evaluation and management service virtually and the availability of in person appointments. I also discussed with the patient that there may be a patient responsible charge related to this service. The patient expressed understanding and agreed to proceed.   History:  Victoria Garrett is a 20 y.o. [phone] female being evaluated today for follow up for anxiety and dysuria.  She states that the anxiety has improved with Vistaril and Zoloft; however, she still has mild urgency and difficulty initiating a void. She denies any abnormal vaginal discharge, bleeding, pelvic pain or other concerns.       Past Medical History:  Diagnosis Date   Anemia    Benign gestational thrombocytopenia in third trimester (HCC) 02/28/2020   Plts 135 at new OB, then 127 at 28 weeks Check platelets at 36 week visit [ ]     Heart murmur    as a child    Preterm uterine contractions in third trimester, antepartum 02/28/2020   Supervision of other normal pregnancy, antepartum 02/13/2020    Nursing Staff Provider Office Location [facility]  Dating   6w 5d 02/15/2020 Language  ENGLISH Anatomy US   WNL visible in Care everywhere 10/2019 Flu Vaccine  02/14/2020 Genetic Screen  NIPS:   AFP:   First Screen:  Quad:   TDaP Vaccine   02/14/2020 Hgb A1C or  GTT Normal 1 hour 01/04/2020 per [facility] COVID Vaccine NOT VACCINATED   LAB RESULTS  Rhogam   Blood Type    B POS Feeding Plan BREASTFEED A   UTI in pregnancy 03/02/2020   Vs bacteriuria. Treated 1/14. [ ]  TOC   Past Surgical History:  Procedure Laterality Date   IR NEPHROURETERAL CATH  PLACE RIGHT Right 10/2015   NEPHROSTOMY TUBE PLACEMENT ([facility] HX)     The following portions of the patient's history were reviewed and updated as appropriate: allergies, current medications, past family history, past medical history, past social history, past surgical history and problem list.    Review of Systems:  Pertinent items noted in HPI and remainder of comprehensive ROS otherwise negative.  Physical Exam:   General:  Alert, oriented and cooperative. Patient appears to be in no acute distress.  Mental Status: Normal mood and affect. Normal behavior. Normal judgment and thought content.   Respiratory: Normal respiratory effort, no problems with respiration noted  Rest of physical exam deferred due to type of encounter  Labs and Imaging Results for orders placed or performed in visit on 12/12/20 (from the past 336 hour(s))  Urine Culture   Collection Time: 12/12/20  4:31 PM   Specimen: Urine   UR  Result Value Ref Range   Urine Culture, Routine Final report    Organism ID, Bacteria No growth   Ferritin   Collection Time: 12/12/20  4:56 PM  Result Value Ref Range   Ferritin 67 15 - 150 ng/mL  Hemoglobin A1c   Collection Time: 12/12/20  4:56 PM  Result Value Ref Range   Hgb A1c MFr Bld 5.5 4.8 - 5.6 %   Est. average glucose Bld gHb Est-mCnc 111 mg/dL  Comprehensive metabolic panel  Collection Time: 12/12/20  4:56 PM  Result Value Ref Range   Glucose 82 70 - 99 mg/dL   BUN 9 6 - 20 mg/dL   Creatinine, Ser 0.57 0.57 - 1.00 mg/dL   eGFR 133 >59 mL/min/1.73   BUN/Creatinine Ratio 16 9 - 23   Sodium 140 134 - 144 mmol/L   Potassium 4.1 3.5 - 5.2 mmol/L   Chloride 101 96 - 106 mmol/L   CO2 23 20 - 29 mmol/L   Calcium 9.8 8.7 - 10.2 mg/dL   Total Protein 7.4 6.0 - 8.5 g/dL   Albumin 4.7 3.9 - 5.0 g/dL   Globulin, Total 2.7 1.5 - 4.5 g/dL   Albumin/Globulin Ratio 1.7 1.2 - 2.2   Bilirubin Total <0.2 0.0 - 1.2 mg/dL   Alkaline Phosphatase 67 44 - 121 IU/L   AST 27 0 -  40 IU/L   ALT 59 (H) 0 - 32 IU/L  POCT Urinalysis Dipstick   Collection Time: 12/12/20  4:56 PM  Result Value Ref Range   Color, UA GOLD    Clarity, UA HAZY    Glucose, UA Negative Negative   Bilirubin, UA NEGATIVE    Ketones, UA POSITIVE SMALL    Spec Grav, UA >=1.030 (A) 1.010 - 1.025   Blood, UA NEGATIVE    pH, UA 5.0 5.0 - 8.0   Protein, UA Positive (A) Negative   Urobilinogen, UA negative (A) 0.2 or 1.0 E.U./dL   Nitrite, UA NEGATIVE    Leukocytes, UA Trace (A) Negative   Appearance     Odor     CT ABDOMEN PELVIS W CONTRAST  Result Date: 12/04/2020 CLINICAL DATA:  One day of generalized abdominal and back pain. EXAM: CT ABDOMEN AND PELVIS WITH CONTRAST TECHNIQUE: Multidetector CT imaging of the abdomen and pelvis was performed using the standard protocol following bolus administration of intravenous contrast. CONTRAST:  16mL OMNIPAQUE IOHEXOL 350 MG/ML SOLN COMPARISON:  None. FINDINGS: Lower chest: No acute abnormality. Hepatobiliary: Diffuse hepatic steatosis. Gallbladder is unremarkable. No biliary ductal dilation. Pancreas: No pancreatic ductal dilation or evidence of acute inflammation. Spleen: Within normal limits. Adrenals/Urinary Tract: Adrenal glands are unremarkable. Kidneys are normal, without renal calculi, solid enhancing lesion, or hydronephrosis. Bladder is unremarkable for degree of distension. Stomach/Bowel: Stomach is unremarkable for degree of distension. No pathologic dilation of small or large bowel. The appendix and terminal ileum appear normal. Vascular/Lymphatic: No significant vascular findings are present. No pathologically enlarged abdominal or pelvic lymph nodes. Reproductive: Uterus is unremarkable. Simple appearing 2.5 cm right ovarian cyst. There is some pelvic free fluid tracking from the right ovarian cyst into the pelvis which may be physiologic or reflect cyst rupture. Simple appearing 1.7 cm left ovarian cyst likely reflecting a dominant follicle. Other:  Trace pelvic free fluid.  No pneumoperitoneum. Musculoskeletal: No acute or significant osseous findings. IMPRESSION: 1. Simple appearing bilateral ovarian cysts measuring up to 2.5 cm. No follow-up imaging is recommended. Reference: JACR 2020 Feb;17(2):248-254. 2. There is trace pelvic free fluid tracking from the right ovarian cyst into the pelvis which may be physiologic or reflect cyst rupture. 3. Diffuse hepatic steatosis. 4. Normal appendix. Electronically Signed   By: Dahlia Bailiff M.D.   On: 12/04/2020 20:00       Assessment and Plan:     1. Dysuria - negative urine culture - may need Urology referral.  Will follow clinically for now.  2. Anxiety - taking Vistaril / Zoloft wth some improvement.  Continue present management. Rx: -  Ambulatory referral to [place]  3. Thrombocytopenia ([place]), mild - has appointment with Hematology       I discussed the assessment and treatment plan with the patient. The patient was provided an opportunity to ask questions and all were answered. The patient agreed with the plan and demonstrated an understanding of the instructions.   The patient was advised to call back or seek an in-person evaluation/go to the ED if the symptoms worsen or if the condition fails to improve as anticipated.  I have spent a total of 15 minutes of face-to-face time, excluding clinical staff time, reviewing notes and preparing to see patient, ordering tests and/or medications, and counseling the patient.    Baltazar Najjar, MD [facility] for [facility], [place] [facility]  12/19/20

## 2020-12-25 ENCOUNTER — Encounter: Payer: Medicaid Other | Admitting: Licensed Clinical Social Worker

## 2020-12-25 LAB — CYTOLOGY - PAP
Comment: NEGATIVE
Comment: NEGATIVE
Diagnosis: UNDETERMINED — AB
HPV 16: NEGATIVE
HPV 18 / 45: NEGATIVE
High risk HPV: POSITIVE — AB

## 2020-12-26 ENCOUNTER — Inpatient Hospital Stay: Payer: Medicaid Other

## 2020-12-26 ENCOUNTER — Inpatient Hospital Stay: Payer: Medicaid Other | Attending: Hematology | Admitting: Hematology

## 2020-12-26 ENCOUNTER — Other Ambulatory Visit: Payer: Self-pay

## 2020-12-26 VITALS — BP 118/58 | HR 63 | Temp 97.7°F | Resp 17 | Ht 62.0 in | Wt 174.5 lb

## 2020-12-26 DIAGNOSIS — Z862 Personal history of diseases of the blood and blood-forming organs and certain disorders involving the immune mechanism: Secondary | ICD-10-CM | POA: Diagnosis not present

## 2020-12-26 DIAGNOSIS — D696 Thrombocytopenia, unspecified: Secondary | ICD-10-CM

## 2020-12-26 DIAGNOSIS — R5383 Other fatigue: Secondary | ICD-10-CM

## 2020-12-26 LAB — CBC WITH DIFFERENTIAL/PLATELET
Abs Immature Granulocytes: 0.01 10*3/uL (ref 0.00–0.07)
Basophils Absolute: 0 10*3/uL (ref 0.0–0.1)
Basophils Relative: 0 %
Eosinophils Absolute: 0 10*3/uL (ref 0.0–0.5)
Eosinophils Relative: 1 %
HCT: 38 % (ref 36.0–46.0)
Hemoglobin: 13.1 g/dL (ref 12.0–15.0)
Immature Granulocytes: 0 %
Lymphocytes Relative: 43 %
Lymphs Abs: 2.6 10*3/uL (ref 0.7–4.0)
MCH: 28.7 pg (ref 26.0–34.0)
MCHC: 34.5 g/dL (ref 30.0–36.0)
MCV: 83.2 fL (ref 80.0–100.0)
Monocytes Absolute: 0.4 10*3/uL (ref 0.1–1.0)
Monocytes Relative: 7 %
Neutro Abs: 3 10*3/uL (ref 1.7–7.7)
Neutrophils Relative %: 49 %
Platelets: 138 10*3/uL — ABNORMAL LOW (ref 150–400)
RBC: 4.57 MIL/uL (ref 3.87–5.11)
RDW: 13 % (ref 11.5–15.5)
WBC: 6 10*3/uL (ref 4.0–10.5)
nRBC: 0 % (ref 0.0–0.2)

## 2020-12-26 LAB — TSH: TSH: 1.685 u[IU]/mL (ref 0.308–3.960)

## 2020-12-26 LAB — VITAMIN B12: Vitamin B-12: 393 pg/mL (ref 180–914)

## 2020-12-26 LAB — IRON AND TIBC
Iron: 124 ug/dL (ref 41–142)
Saturation Ratios: 35 % (ref 21–57)
TIBC: 351 ug/dL (ref 236–444)
UIBC: 227 ug/dL (ref 120–384)

## 2020-12-26 LAB — IMMATURE PLATELET FRACTION: Immature Platelet Fraction: 13.2 % — ABNORMAL HIGH (ref 1.2–8.6)

## 2020-12-26 LAB — HIV ANTIBODY (ROUTINE TESTING W REFLEX): HIV Screen 4th Generation wRfx: NONREACTIVE

## 2020-12-26 LAB — HEPATITIS C ANTIBODY: HCV Ab: NONREACTIVE

## 2020-12-26 LAB — FERRITIN: Ferritin: 38 ng/mL (ref 11–307)

## 2020-12-26 NOTE — Progress Notes (Addendum)
[name]   HEMATOLOGY/ONCOLOGY CONSULTATION NOTE  Date of Service: 12/26/2020  Patient Care Team: [place], [place] as PCP - General (Internal Medicine)  CHIEF COMPLAINTS/PURPOSE OF CONSULTATION:  Thrombocytopenia  HISTORY OF PRESENTING ILLNESS:   Victoria Garrett is a wonderful 20 y.o. female who has been referred to [place] by Dr Baltazar Najjar MD for evaluation and management of thrombocytopenia  Patient has a history of recurrent urinary tract infections, recent bacterial vaginosis treated with Flagyl, benign gestational thrombocytopenia during her recent pregnancy with delivery date of 03/27/2020. Patient was noted to have a platelet count of 111k  at 40 weeks pregnancy on 03/26/2020.  She did not need any interventions for her benign gestational thrombocytopenia.  Patient has had CBC on 09/12/2020 when she was not pregnant and her platelet counts had normalized to 165k with otherwise normal CBC.  This confirmed that her thrombocytopenia was gestational and makes immune thrombocytopenia or other etiologies less likely.  Patient subsequently has had repeat CBC with her OB/GYN doctor on 12/04/2020 which showed platelet count of 145k with normal hemoglobin of 15.2 and WBC count of 18k  She was seen by Dr. Jodi Mourning on 12/12/2020 and noted to have acute cystitis and treated with Ceftin and was also noted to have bacterial vaginosis for which she was prescribed metronidazole.  Reviewed CBC on 12/10/2020 showed resolution of leukocytosis with WBC count of 8k, platelets of 149k and hemoglobin of 13.8.  Patient notes no issues with abnormal bleeding or bruising.  Her dysuria and vaginal discharge have resolved.  She notes no acute concerns at this time. No previous history of known ITP. No family history of any blood disorders.  MEDICAL HISTORY:  Past Medical History:  Diagnosis Date   Anemia    Benign gestational thrombocytopenia in third trimester ([place]) 02/28/2020   Plts 135 at new OB, then  127 at 28 weeks Check platelets at 36 week visit [ ]     Heart murmur    as a child    Preterm uterine contractions in third trimester, antepartum 02/28/2020   Supervision of other normal pregnancy, antepartum 02/13/2020    Nursing Staff Provider Office Location [place]  Dating   6w 5d [place] Language  ENGLISH Anatomy US   WNL visible in Care everywhere 10/2019 Flu Vaccine  02/14/2020 Genetic Screen  NIPS:   AFP:   First Screen:  Quad:   TDaP Vaccine   02/14/2020 Hgb A1C or  GTT Normal 1 hour 01/04/2020 per [place] Vaccine NOT VACCINATED   LAB RESULTS  Rhogam   Blood Type    B POS Feeding Plan BREASTFEED A   UTI in pregnancy 03/02/2020   Vs bacteriuria. Treated 1/14. [ ]  TOC    SURGICAL HISTORY: Past Surgical History:  Procedure Laterality Date   IR NEPHROURETERAL CATH PLACE RIGHT Right 10/2015   NEPHROSTOMY TUBE PLACEMENT ([facility] HX)      SOCIAL HISTORY: Social History   Socioeconomic History   Marital status: Single    Spouse name: Not on file   Number of children: 1   Years of education: Not on file   Highest education level: Not on file  Occupational History   Not on file  Tobacco Use   Smoking status: Never   Smokeless tobacco: Never  Vaping Use   Vaping Use: Never used  Substance and Sexual Activity   Alcohol use: Not Currently   Drug use: Never   Sexual activity: Not Currently    Birth control/protection: None  Other  Topics Concern   Not on file  Social History Narrative   Not on file   Social Determinants of Health   Financial Resource Strain: Not on file  Food Insecurity: Not on file  Transportation Needs: Not on file  Physical Activity: Not on file  Stress: Not on file  Social Connections: Not on file  Intimate Partner Violence: Not on file    FAMILY HISTORY: Family History  Problem Relation Age of Onset   Healthy Mother    Healthy Father    Asthma Sister    Diabetes Maternal Grandmother     ALLERGIES:  has No Known  Allergies.  MEDICATIONS:  Current Outpatient Medications  Medication Sig Dispense Refill   cefUROXime (CEFTIN) 500 MG tablet Take 1 tablet (500 mg total) by mouth 2 (two) times daily with a meal. 14 tablet 0   cephALEXin (KEFLEX) 500 MG capsule 1 cap po bid x 7 days (Patient not taking: No sig reported) 14 capsule 0   coconut oil OIL Apply 1 application topically as needed. (Patient not taking: No sig reported)  0   hydrOXYzine (ATARAX/VISTARIL) 50 MG tablet Take 1 tablet (50 mg total) by mouth daily. 30 tablet 1   ibuprofen (ADVIL) 600 MG tablet Take 1 tablet (600 mg total) by mouth every 6 (six) hours. (Patient not taking: No sig reported) 30 tablet 0   metoCLOPramide (REGLAN) 10 MG tablet Take 1 tablet (10 mg total) by mouth every 8 (eight) hours as needed (headache, nausea, vomiting). (Patient not taking: No sig reported) 30 tablet 0   metroNIDAZOLE (FLAGYL) 500 MG tablet Take 1 tablet (500 mg total) by mouth 2 (two) times daily. 14 tablet 2   Prenatal Vit-Fe Fumarate-FA (PRENATAL MULTIVITAMIN) TABS tablet Take 1 tablet by mouth daily at 12 noon. (Patient not taking: No sig reported)     sertraline (ZOLOFT) 50 MG tablet Take 1 tablet (50 mg total) by mouth daily. 30 tablet 11   No current facility-administered medications for this visit.    REVIEW OF SYSTEMS:    .10 Point review of Systems was done is negative except as noted above.   PHYSICAL EXAMINATION: ECOG PERFORMANCE STATUS: 1 - Symptomatic but completely ambulatory  . Vitals:   12/26/20 1112  BP: (!) 118/58  Pulse: 63  Resp: 17  Temp: 97.7 F (36.5 C)  SpO2: 93%   Filed Weights   12/26/20 1112  Weight: 174 lb 8 oz (79.2 kg)   .Body mass index is 31.92 kg/m.  GENERAL:alert, in no acute distress and comfortable SKIN: no acute rashes, no significant lesions EYES: conjunctiva are pink and non-injected, sclera anicteric OROPHARYNX: MMM, no exudates, no oropharyngeal erythema or ulceration NECK: supple, no  JVD LYMPH:  no palpable lymphadenopathy in the cervical, axillary or inguinal regions LUNGS: clear to auscultation b/l with normal respiratory effort HEART: regular rate & rhythm ABDOMEN:  normoactive bowel sounds , non tender, not distended. Extremity: no pedal edema PSYCH: alert & oriented x 3 with fluent speech NEURO: no focal motor/sensory deficits  LABORATORY DATA:  I have reviewed the data as listed  . CBC Latest Ref Rng & Units 12/26/2020 12/26/2020 12/04/2020  WBC 4.0 - 10.5 K/uL 6.0 - 18.0(H)  Hemoglobin 12.0 - 15.0 g/dL 58.5 - 15.2(H)  Hematocrit 34.0 - 46.6 % 38.0 38.8 44.7  Platelets 150 - 400 K/uL 138(L) - 145(L)    . CMP Latest Ref Rng & Units 12/12/2020 12/04/2020 09/12/2020  Glucose 70 - 99 mg/dL 82 98 277(O)  BUN 6 - 20 mg/dL 9 7 8   Creatinine 0.57 - 1.00 mg/dL 0.57 0.63 0.51  Sodium 134 - 144 mmol/L 140 134(L) 136  Potassium 3.5 - 5.2 mmol/L 4.1 3.4(L) 3.6  Chloride 96 - 106 mmol/L 101 100 102  CO2 20 - 29 mmol/L 23 23 31   Calcium 8.7 - 10.2 mg/dL 9.8 9.8 9.7  Total Protein 6.0 - 8.5 g/dL 7.4 8.3(H) -  Total Bilirubin 0.0 - 1.2 mg/dL <0.2 1.0 -  Alkaline Phos 44 - 121 IU/L 67 64 -  AST 0 - 40 IU/L 27 23 -  ALT 0 - 32 IU/L 59(H) 53(H) -     RADIOGRAPHIC STUDIES: I have personally reviewed the radiological images as listed and agreed with the findings in the report. CT ABDOMEN PELVIS W CONTRAST  Result Date: 12/04/2020 CLINICAL DATA:  One day of generalized abdominal and back pain. EXAM: CT ABDOMEN AND PELVIS WITH CONTRAST TECHNIQUE: Multidetector CT imaging of the abdomen and pelvis was performed using the standard protocol following bolus administration of intravenous contrast. CONTRAST:  36mL OMNIPAQUE IOHEXOL 350 MG/ML SOLN COMPARISON:  None. FINDINGS: Lower chest: No acute abnormality. Hepatobiliary: Diffuse hepatic steatosis. Gallbladder is unremarkable. No biliary ductal dilation. Pancreas: No pancreatic ductal dilation or evidence of acute inflammation.  Spleen: Within normal limits. Adrenals/Urinary Tract: Adrenal glands are unremarkable. Kidneys are normal, without renal calculi, solid enhancing lesion, or hydronephrosis. Bladder is unremarkable for degree of distension. Stomach/Bowel: Stomach is unremarkable for degree of distension. No pathologic dilation of small or large bowel. The appendix and terminal ileum appear normal. Vascular/Lymphatic: No significant vascular findings are present. No pathologically enlarged abdominal or pelvic lymph nodes. Reproductive: Uterus is unremarkable. Simple appearing 2.5 cm right ovarian cyst. There is some pelvic free fluid tracking from the right ovarian cyst into the pelvis which may be physiologic or reflect cyst rupture. Simple appearing 1.7 cm left ovarian cyst likely reflecting a dominant follicle. Other: Trace pelvic free fluid.  No pneumoperitoneum. Musculoskeletal: No acute or significant osseous findings. IMPRESSION: 1. Simple appearing bilateral ovarian cysts measuring up to 2.5 cm. No follow-up imaging is recommended. Reference: JACR 2020 Feb;17(2):248-254. 2. There is trace pelvic free fluid tracking from the right ovarian cyst into the pelvis which may be physiologic or reflect cyst rupture. 3. Diffuse hepatic steatosis. 4. Normal appendix. Electronically Signed   By: Dahlia Bailiff M.D.   On: 12/04/2020 20:00    ASSESSMENT & PLAN:   20 year old female with  1) history of benign gestational thrombocytopenia with platelet nadir of 111k at term on 03/26/2020.  Platelet counts have normalized to 165k in July 2022.  2) recent mild thrombocytopenia with platelets close to normal between 140 and 150k in the context of UTI and bacterial vaginosis and use of Ceftin and metronidazole to treat this. This represents mild thrombocytopenia from likely the infection on medications.  3) leukocytosis related to UTI and bacterial vaginosis -resolved on treatment. PLAN -Discussed previous lab results and likely  possibilities of mild thrombocytopenia. -Labs were ordered to evaluate her mild thrombocytopenia. -CBC today showed normal WBC count of 6k, normal hemoglobin of 13.1 with an MCV of 83, platelets of 138k -Immature platelet fraction adequate at 13.2% -B12 level within normal limits at 393 -RBC folate within normal limits -Ferritin of 38 with iron saturation of 35% -TSH within normal limits at 1.65 -Hepatitis C testing negative, HIV testing negative. -Patient has mild thrombocytopenia of undetermined clinical significance.  Likely from recent infections and antibiotics.  No additional  hematology work-up is recommended at this time. Would recommend repeat CBC with primary care physician in 6 months and 1 year. -Would recommend reconsulting [place] if her platelet counts are less than 80k  -Recommended patient to take daily multivitamin with iron for healthcare maintenance purposes.  Orders Placed This Encounter  Procedures   CBC with Differential/Platelet    Standing Status:   Future    Number of Occurrences:   1    Standing Expiration Date:   12/26/2021   Immature Platelet Fraction    Standing Status:   Future    Number of Occurrences:   1    Standing Expiration Date:   12/26/2021   Vitamin B12    Standing Status:   Future    Number of Occurrences:   1    Standing Expiration Date:   12/26/2021   Folate RBC    Standing Status:   Future    Number of Occurrences:   1    Standing Expiration Date:   12/26/2021   Ferritin    Standing Status:   Future    Number of Occurrences:   1    Standing Expiration Date:   12/26/2021   Iron and TIBC    Standing Status:   Future    Number of Occurrences:   1    Standing Expiration Date:   12/26/2021   TSH    Standing Status:   Future    Number of Occurrences:   1    Standing Expiration Date:   12/26/2021   Hepatitis C antibody    Standing Status:   Future    Number of Occurrences:   1    Standing Expiration Date:   12/26/2021   HIV Antibody (routine  testing w rflx)    Standing Status:   Future    Number of Occurrences:   1    Standing Expiration Date:   12/26/2021      All of the patients questions were answered with apparent satisfaction. The patient knows to call the clinic with any problems, questions or concerns.  I spent 30 minutes counseling the patient face to face. The total time spent in the appointment was 40 minutes and more than 50% was on counseling and direct patient cares.    Sullivan Lone MD MS AAHIVMS [facility] Hematology/Oncology Physician [place]  12/26/2020 11:33 AM

## 2020-12-27 ENCOUNTER — Telehealth (INDEPENDENT_AMBULATORY_CARE_PROVIDER_SITE_OTHER): Payer: Medicaid Other | Admitting: Obstetrics

## 2020-12-27 ENCOUNTER — Encounter: Payer: Self-pay | Admitting: Obstetrics

## 2020-12-27 DIAGNOSIS — R87810 Cervical high risk human papillomavirus (HPV) DNA test positive: Secondary | ICD-10-CM

## 2020-12-27 DIAGNOSIS — R87610 Atypical squamous cells of undetermined significance on cytologic smear of cervix (ASC-US): Secondary | ICD-10-CM

## 2020-12-27 NOTE — Progress Notes (Signed)
GYNECOLOGY VIRTUAL VISIT ENCOUNTER NOTE  Provider location: Center for [facility] at [facility]   Patient location: Home  I connected with [name] on 12/27/20 at  4:10 PM EST by MyChart Video Encounter and verified that I am speaking with the correct person using two identifiers.   I discussed the limitations, risks, security and privacy concerns of performing an evaluation and management service virtually and the availability of in person appointments. I also discussed with the patient that there may be a patient responsible charge related to this service. The patient expressed understanding and agreed to proceed.   History:  Victoria Garrett is a 20 y.o. G2P2002 female being evaluated today for pap smear results and management recommendations. She denies any abnormal vaginal discharge, bleeding, pelvic pain or other concerns.       Past Medical History:  Diagnosis Date   Anemia    Benign gestational thrombocytopenia in third trimester (HCC) 02/28/2020   Plts 135 at new OB, then 127 at 28 weeks Check platelets at 36 week visit [ ]     Heart murmur    as a child    Preterm uterine contractions in third trimester, antepartum 02/28/2020   Supervision of other normal pregnancy, antepartum 02/13/2020    Nursing Staff Provider Office Location [facility]  Dating   6w 5d 02/15/2020 Language  ENGLISH Anatomy US   WNL visible in Care everywhere 10/2019 Flu Vaccine  02/14/2020 Genetic Screen  NIPS:   AFP:   First Screen:  Quad:   TDaP Vaccine   02/14/2020 Hgb A1C or  GTT Normal 1 hour 01/04/2020 per [facility] COVID Vaccine NOT VACCINATED   LAB RESULTS  Rhogam   Blood Type    B POS Feeding Plan BREASTFEED A   UTI in pregnancy 03/02/2020   Vs bacteriuria. Treated 1/14. [ ]  TOC   Past Surgical History:  Procedure Laterality Date   IR NEPHROURETERAL CATH PLACE RIGHT Right 10/2015   NEPHROSTOMY TUBE PLACEMENT [facility] HX)     The following portions of the patient's history were  reviewed and updated as appropriate: allergies, current medications, past family history, past medical history, past social history, past surgical history and problem list.   Health Maintenance:  ASCUS pap and positive HRHPV on 12-12-2020.    Review of Systems:  Pertinent items noted in HPI and remainder of comprehensive ROS otherwise negative.  Physical Exam:   General:  Alert, oriented and cooperative. Patient appears to be in no acute distress.  Mental Status: Normal mood and affect. Normal behavior. Normal judgment and thought content.   Respiratory: Normal respiratory effort, no problems with respiration noted  Rest of physical exam deferred due to type of encounter  Labs and Imaging Results for orders placed or performed in visit on 12/26/20 (from the past 336 hour(s))  HIV Antibody (routine testing w rflx)   Collection Time: 12/26/20 11:56 AM  Result Value Ref Range   HIV Screen 4th Generation wRfx Non Reactive Non Reactive  Hepatitis C antibody   Collection Time: 12/26/20 11:56 AM  Result Value Ref Range   HCV Ab NON REACTIVE NON REACTIVE  TSH   Collection Time: 12/26/20 11:56 AM  Result Value Ref Range   TSH 1.685 0.308 - 3.960 uIU/mL  Iron and TIBC   Collection Time: 12/26/20 11:56 AM  Result Value Ref Range   Iron 124 41 - 142 ug/dL   TIBC 13/09/22 13/09/22 - 638 ug/dL   Saturation Ratios 35 21 - 57 %  UIBC 227 120 - 384 ug/dL  Ferritin   Collection Time: 12/26/20 11:56 AM  Result Value Ref Range   Ferritin 38 11 - 307 ng/mL  Vitamin B12   Collection Time: 12/26/20 11:56 AM  Result Value Ref Range   Vitamin B-12 393 180 - 914 pg/mL  Immature Platelet Fraction   Collection Time: 12/26/20 11:56 AM  Result Value Ref Range   Immature Platelet Fraction 13.2 (H) 1.2 - 8.6 %  CBC with Differential/Platelet   Collection Time: 12/26/20 11:56 AM  Result Value Ref Range   WBC 6.0 4.0 - 10.5 K/uL   RBC 4.57 3.87 - 5.11 MIL/uL   Hemoglobin 13.1 12.0 - 15.0 g/dL   HCT 56.2  13.0 - 86.5 %   MCV 83.2 80.0 - 100.0 fL   MCH 28.7 26.0 - 34.0 pg   MCHC 34.5 30.0 - 36.0 g/dL   RDW 78.4 69.6 - 29.5 %   Platelets 138 (L) 150 - 400 K/uL   nRBC 0.0 0.0 - 0.2 %   Neutrophils Relative % 49 %   Neutro Abs 3.0 1.7 - 7.7 K/uL   Lymphocytes Relative 43 %   Lymphs Abs 2.6 0.7 - 4.0 K/uL   Monocytes Relative 7 %   Monocytes Absolute 0.4 0.1 - 1.0 K/uL   Eosinophils Relative 1 %   Eosinophils Absolute 0.0 0.0 - 0.5 K/uL   Basophils Relative 0 %   Basophils Absolute 0.0 0.0 - 0.1 K/uL   Immature Granulocytes 0 %   Abs Immature Granulocytes 0.01 0.00 - 0.07 K/uL   CT ABDOMEN PELVIS W CONTRAST  Result Date: 12/04/2020 CLINICAL DATA:  One day of generalized abdominal and back pain. EXAM: CT ABDOMEN AND PELVIS WITH CONTRAST TECHNIQUE: Multidetector CT imaging of the abdomen and pelvis was performed using the standard protocol following bolus administration of intravenous contrast. CONTRAST:  89mL OMNIPAQUE IOHEXOL 350 MG/ML SOLN COMPARISON:  None. FINDINGS: Lower chest: No acute abnormality. Hepatobiliary: Diffuse hepatic steatosis. Gallbladder is unremarkable. No biliary ductal dilation. Pancreas: No pancreatic ductal dilation or evidence of acute inflammation. Spleen: Within normal limits. Adrenals/Urinary Tract: Adrenal glands are unremarkable. Kidneys are normal, without renal calculi, solid enhancing lesion, or hydronephrosis. Bladder is unremarkable for degree of distension. Stomach/Bowel: Stomach is unremarkable for degree of distension. No pathologic dilation of small or large bowel. The appendix and terminal ileum appear normal. Vascular/Lymphatic: No significant vascular findings are present. No pathologically enlarged abdominal or pelvic lymph nodes. Reproductive: Uterus is unremarkable. Simple appearing 2.5 cm right ovarian cyst. There is some pelvic free fluid tracking from the right ovarian cyst into the pelvis which may be physiologic or reflect cyst rupture. Simple  appearing 1.7 cm left ovarian cyst likely reflecting a dominant follicle. Other: Trace pelvic free fluid.  No pneumoperitoneum. Musculoskeletal: No acute or significant osseous findings. IMPRESSION: 1. Simple appearing bilateral ovarian cysts measuring up to 2.5 cm. No follow-up imaging is recommended. Reference: JACR 2020 Feb;17(2):248-254. 2. There is trace pelvic free fluid tracking from the right ovarian cyst into the pelvis which may be physiologic or reflect cyst rupture. 3. Diffuse hepatic steatosis. 4. Normal appendix. Electronically Signed   By: Maudry Mayhew M.D.   On: 12/04/2020 20:00           Diagnosis: Cytology - PAP( [place]) Collected: 12/12/20 1623  Result status: Final  Resulting lab: [place] [facility]  Value: - Atypical squamous cells of undetermined significance (ASC-US) Abnormal    *Additional information available - result  note   [facility]  [facility] (Order [id number]) MyChart Results Release  MyChart Status: Active  Results Release    [facility] abnormal data [facility] Order: [id number] Status: Edited Result - FINAL   Visible to patient: Yes (seen)   Next appt: 01/30/2021 at 10:35 AM in Obstetrics and Gynecology Coral Ceo, MD)   Dx: Encounter for routine gynecological e...   1 Result Note   2 HM Topics Component 2 wk ago   High risk HPV Positive Abnormal    HPV 16 Negative   HPV 18 / 45 Negative   Adequacy Satisfactory for evaluation; transformation zone component PRESENT.   Diagnosis - Atypical squamous cells of undetermined significance (ASC-US) Abnormal    Comment Normal Reference Range HPV - Negative   Comment Normal Reference Range HPV 16 18 45 -Negative   Resulting Agency [facility]           Assessment and Plan:     1. ASCUS with positive high risk HPV cervical - schedule colposcopy       I discussed the assessment and treatment plan with the patient. The patient was provided an  opportunity to ask questions and all were answered. The patient agreed with the plan and demonstrated an understanding of the instructions.   The patient was advised to call back or seek an in-person evaluation/go to the ED if the symptoms worsen or if the condition fails to improve as anticipated.  I have spent a total of 15 minutes of non-face-to-face time, excluding clinical staff time, reviewing notes and preparing to see patient, ordering tests and/or medications, and counseling the patient.    Coral Ceo, MD Center for [facility], [facility]  12/27/20

## 2020-12-27 NOTE — Progress Notes (Signed)
Patient presents for Mychart visit to discuss pap results. Patient has no other concerns.

## 2020-12-28 LAB — FOLATE RBC
Folate, Hemolysate: 348 ng/mL
Folate, RBC: 897 ng/mL (ref >498)
Hematocrit: 38.8 % (ref 34.0–46.6)

## 2021-01-18 ENCOUNTER — Encounter: Payer: Medicaid Other | Admitting: Obstetrics

## 2021-01-30 ENCOUNTER — Telehealth (INDEPENDENT_AMBULATORY_CARE_PROVIDER_SITE_OTHER): Payer: Medicaid Other | Admitting: Obstetrics

## 2021-01-30 ENCOUNTER — Encounter: Payer: Self-pay | Admitting: Obstetrics

## 2021-01-30 DIAGNOSIS — R87810 Cervical high risk human papillomavirus (HPV) DNA test positive: Secondary | ICD-10-CM

## 2021-01-30 DIAGNOSIS — R87610 Atypical squamous cells of undetermined significance on cytologic smear of cervix (ASC-US): Secondary | ICD-10-CM | POA: Diagnosis not present

## 2021-01-30 NOTE — Progress Notes (Signed)
TELEHEALTH GYNECOLOGY VISIT ENCOUNTER NOTE  Provider location: [facility] for [facility] at [facility]   Patient location: Home  I connected with Victoria Garrett on 01/30/21 at 10:35 AM EST by telephone and verified that I am speaking with the correct person using two identifiers. Patient was unable to do MyChart audiovisual encounter due to technical difficulties, she tried several times.    I discussed the limitations, risks, security and privacy concerns of performing an evaluation and management service by telephone and the availability of in person appointments. I also discussed with the patient that there may be a patient responsible charge related to this service. The patient expressed understanding and agreed to proceed.   History:  Victoria Garrett is a 20 y.o. G23P2002 female being evaluated today for results of colpo. But she did not keep colpo appointment because she was menstruating.  She rescheduled her appointment.  She denies any abnormal vaginal discharge, bleeding, pelvic pain or other concerns.       Past Medical History:  Diagnosis Date   Anemia    Benign gestational thrombocytopenia in third trimester (HCC) 02/28/2020   Plts 135 at new OB, then 127 at 28 weeks Check platelets at 36 week visit [ ]     Heart murmur    as a child    Preterm uterine contractions in third trimester, antepartum 02/28/2020   Supervision of other normal pregnancy, antepartum 02/13/2020    Nursing Staff Provider Office Location [facility]  Dating   6w 5d 02/15/2020 Language  ENGLISH Anatomy US   WNL visible in Care everywhere 10/2019 Flu Vaccine  02/14/2020 Genetic Screen  NIPS:   AFP:   First Screen:  Quad:   TDaP Vaccine   02/14/2020 Hgb A1C or  GTT Normal 1 hour 01/04/2020 per [facility] COVID Vaccine NOT VACCINATED   LAB RESULTS  Rhogam   Blood Type    B POS Feeding Plan BREASTFEED A   UTI in pregnancy 03/02/2020   Vs bacteriuria. Treated 1/14. [ ]  TOC   Past Surgical History:   Procedure Laterality Date   IR NEPHROURETERAL CATH PLACE RIGHT Right 10/2015   NEPHROSTOMY TUBE PLACEMENT [facility] HX)     The following portions of the patient's history were reviewed and updated as appropriate: allergies, current medications, past family history, past medical history, past social history, past surgical history and problem list.   Health Maintenance:  ASCUS pap and positive HRHPV on 12-27-2020.    Review of Systems:  Pertinent items noted in HPI and remainder of comprehensive ROS otherwise negative.  Physical Exam:   General:  Alert, oriented and cooperative.   Mental Status: Normal mood and affect perceived. Normal judgment and thought content.  Physical exam deferred due to nature of the encounter  Labs and Imaging No results found for this or any previous visit (from the past 336 hour(s)). No results found.     Assessment and Plan:       1. ASCUS pap with positive HRHPV cervical - discussed pap results.  All questions answered. - colposcopy rescheduled      I discussed the assessment and treatment plan with the patient. The patient was provided an opportunity to ask questions and all were answered. The patient agreed with the plan and demonstrated an understanding of the instructions. The patient was advised to call back or seek an in-person evaluation/go to the ED if the symptoms worsen or if the condition fails to improve as anticipated.  I have spent a total  of 15 minutes of face-to-face and non-face-to-face time, excluding clinical staff time, reviewing notes and preparing to see patient, ordering tests and/or medications, and counseling the patient.    Coral Ceo, MD Center for [facility], [facility]  01/30/21

## 2021-02-07 ENCOUNTER — Ambulatory Visit (INDEPENDENT_AMBULATORY_CARE_PROVIDER_SITE_OTHER): Payer: Medicaid Other

## 2021-02-07 ENCOUNTER — Other Ambulatory Visit: Payer: Self-pay

## 2021-02-07 VITALS — BP 132/70 | HR 88 | Ht 62.0 in | Wt 178.0 lb

## 2021-02-07 DIAGNOSIS — R300 Dysuria: Secondary | ICD-10-CM | POA: Diagnosis not present

## 2021-02-07 LAB — POCT URINALYSIS DIPSTICK
Appearance: ABNORMAL
Bilirubin, UA: NEGATIVE
Glucose, UA: POSITIVE — AB
Ketones, UA: NEGATIVE
Nitrite, UA: NEGATIVE
Protein, UA: NEGATIVE
Spec Grav, UA: 1.02 (ref 1.010–1.025)
Urobilinogen, UA: 0.2 E.U./dL
pH, UA: 8.5 — AB (ref 5.0–8.0)

## 2021-02-07 MED ORDER — NITROFURANTOIN MONOHYD MACRO 100 MG PO CAPS: 100.0000 mg | ORAL_CAPSULE | Freq: Two times a day (BID) | ORAL | 0 refills | Status: DC

## 2021-02-07 NOTE — Progress Notes (Signed)
SUBJECTIVE: Victoria Garrett is a 20 y.o. female who complains of urinary frequency, urgency and dysuria x 4 days, without flank pain, fever, chills, or abnormal vaginal discharge or bleeding.   OBJECTIVE: Appears well, in no apparent distress.  Vital signs are normal. Urine dipstick shows positive for WBC's and positive for RBC's.    ASSESSMENT: Dysuria  PLAN: Treatment per orders.  Call or return to clinic prn if these symptoms worsen or fail to improve as anticipated.

## 2021-02-14 LAB — URINE CULTURE

## 2021-02-27 ENCOUNTER — Encounter: Payer: Medicaid Other | Admitting: Obstetrics and Gynecology

## 2021-04-04 ENCOUNTER — Other Ambulatory Visit: Payer: Self-pay

## 2021-04-04 ENCOUNTER — Encounter: Payer: Self-pay | Admitting: Emergency Medicine

## 2021-04-04 ENCOUNTER — Ambulatory Visit
Admission: EM | Admit: 2021-04-04 | Discharge: 2021-04-04 | Discharge status: Against Medical Advice | Payer: Medicaid Other

## 2021-04-04 NOTE — ED Notes (Signed)
Called for patient in lobby twice. Called her phone number on file, no answer.

## 2021-04-04 NOTE — ED Notes (Signed)
Called for patient in lobby and on phone, no answer

## 2021-04-04 NOTE — ED Notes (Signed)
Called again, no answer, removing from board.

## 2021-04-08 ENCOUNTER — Ambulatory Visit: Payer: Medicaid Other

## 2021-04-11 ENCOUNTER — Other Ambulatory Visit (HOSPITAL_COMMUNITY)
Admission: RE | Admit: 2021-04-11 | Discharge: 2021-04-11 | Disposition: A | Discharge status: Home / Self Care | Payer: Medicaid Other | Source: Ambulatory Visit | Attending: Obstetrics | Admitting: Obstetrics

## 2021-04-11 ENCOUNTER — Encounter: Payer: Self-pay | Admitting: Obstetrics and Gynecology

## 2021-04-11 ENCOUNTER — Ambulatory Visit (INDEPENDENT_AMBULATORY_CARE_PROVIDER_SITE_OTHER): Payer: Medicaid Other | Admitting: Obstetrics and Gynecology

## 2021-04-11 ENCOUNTER — Other Ambulatory Visit: Payer: Self-pay

## 2021-04-11 VITALS — BP 126/81 | HR 101 | Ht 60.0 in | Wt 178.0 lb

## 2021-04-11 DIAGNOSIS — N898 Other specified noninflammatory disorders of vagina: Secondary | ICD-10-CM | POA: Insufficient documentation

## 2021-04-11 DIAGNOSIS — R87810 Cervical high risk human papillomavirus (HPV) DNA test positive: Secondary | ICD-10-CM | POA: Diagnosis not present

## 2021-04-11 DIAGNOSIS — R87610 Atypical squamous cells of undetermined significance on cytologic smear of cervix (ASC-US): Secondary | ICD-10-CM | POA: Insufficient documentation

## 2021-04-11 HISTORY — DX: Atypical squamous cells of undetermined significance on cytologic smear of cervix (ASC-US): R87.610

## 2021-04-11 NOTE — Progress Notes (Signed)
° °  Subjective:    Patient ID: Victoria Garrett is a 21 y.o. female presenting with Colposcopy  on 04/11/2021  HPI: Pt initially scheduled for colposcopy due to ASCUS pap with HPV seen on her first pap smear.  Current guidelines recommend repap in 1 year.  Colpo will be held for today. Pt also complains about new onset vaginal discharge with off putting odor.  Denies itching.  Pt concerned about fertility.  She has 2 living children.  Pt advised to have timed intercourse for at least 1 year before initiating evaluation.  Review of Systems    Objective:    BP 126/81    Pulse (!) 101    Ht 5' (1.524 m)    Wt 178 lb (80.7 kg)    LMP 03/22/2021    BMI 34.76 kg/m  Physical Exam      Assessment & Plan:   ASCUS pap:  repap in 10 months to reeval status  Vaginal discharge:  vaginal swab taken  Return in about 8 months (around 12/04/2021).  Warden Fillers 04/11/2021 5:18 PM

## 2021-04-11 NOTE — Progress Notes (Signed)
Patient presents for colposcopy. Reports white, milky discharge that smells like "sourdough."   Pt asks nurse if there is possibility that she could be pregnant, even though UPT is negative. Reports that she has been having unprotected sex daily since ovulation, with yesterday being most recent. She has had pregnancy-like symptoms but has not yet missed her period which is due to come around 3/3.

## 2021-04-15 ENCOUNTER — Telehealth: Payer: Self-pay

## 2021-04-15 LAB — CERVICOVAGINAL ANCILLARY ONLY
Bacterial Vaginitis (gardnerella): POSITIVE — AB
Candida Glabrata: NEGATIVE
Candida Vaginitis: NEGATIVE
Comment: NEGATIVE
Comment: NEGATIVE
Comment: NEGATIVE
Comment: NEGATIVE
Trichomonas: NEGATIVE

## 2021-04-15 NOTE — Telephone Encounter (Signed)
Attempted to contact about results, no answer, vm not available

## 2021-04-17 ENCOUNTER — Other Ambulatory Visit: Payer: Self-pay

## 2021-04-17 DIAGNOSIS — N760 Acute vaginitis: Secondary | ICD-10-CM

## 2021-04-17 DIAGNOSIS — B9689 Other specified bacterial agents as the cause of diseases classified elsewhere: Secondary | ICD-10-CM

## 2021-04-17 MED ORDER — METRONIDAZOLE 0.75 % VA GEL: 1.0000 | Freq: Every day | VAGINAL | 1 refills | Status: DC

## 2021-08-31 ENCOUNTER — Ambulatory Visit
Admission: EM | Admit: 2021-08-31 | Discharge: 2021-08-31 | Disposition: A | Discharge status: Home / Self Care | Payer: Medicaid Other | Attending: Physician Assistant | Admitting: Physician Assistant

## 2021-08-31 DIAGNOSIS — R1031 Right lower quadrant pain: Secondary | ICD-10-CM | POA: Diagnosis not present

## 2021-08-31 LAB — POCT URINALYSIS DIP (MANUAL ENTRY)
Bilirubin, UA: NEGATIVE
Blood, UA: NEGATIVE
Glucose, UA: NEGATIVE mg/dL
Ketones, POC UA: NEGATIVE mg/dL
Leukocytes, UA: NEGATIVE
Nitrite, UA: NEGATIVE
Protein Ur, POC: NEGATIVE mg/dL
Spec Grav, UA: 1.025 (ref 1.010–1.025)
Urobilinogen, UA: 1 E.U./dL
pH, UA: 7 (ref 5.0–8.0)

## 2021-08-31 LAB — POCT URINE PREGNANCY: Preg Test, Ur: NEGATIVE

## 2021-08-31 NOTE — Discharge Instructions (Signed)
Recommend following up with primary care physician or OBGYN this week If your pain becomes more severe or you develop fever, chills, nausea, vomiting go to the Emergency Department for evaluation.  Drink plenty of fluids Can take Ibuprofen or tylenol as needed

## 2021-08-31 NOTE — ED Provider Notes (Signed)
[facility]    CSN: [id number] Arrival date & time: 08/31/21  1052      History   Chief Complaint Chief Complaint  Patient presents with   Abdominal Pain    HPI Victoria Garrett is a 21 y.o. female.   Pt complains of right lower abdominal pain that has been intermittent for the last few months.  Denies fever, chills, nausea, vomiting.  She does reports diarrhea yesterday.  She has tried nothing for the symptoms.  Denies urinary sx.  She does have an OBGYN and PCP.    Past Medical History:  Diagnosis Date   Anemia    Benign gestational thrombocytopenia in third trimester (HCC) 02/28/2020   Plts 135 at new OB, then 127 at 28 weeks Check platelets at 36 week visit [ ]     Heart murmur    as a child    Preterm uterine contractions in third trimester, antepartum 02/28/2020   Supervision of other normal pregnancy, antepartum 02/13/2020    Nursing Staff Provider Office Location [facility]  Dating   6w 5d 02/15/2020 Language  ENGLISH Anatomy US   WNL visible in Care everywhere 10/2019 Flu Vaccine  02/14/2020 Genetic Screen  NIPS:   AFP:   First Screen:  Quad:   TDaP Vaccine   02/14/2020 Hgb A1C or  GTT Normal 1 hour 01/04/2020 per [facility] COVID Vaccine NOT VACCINATED   LAB RESULTS  Rhogam   Blood Type    B POS Feeding Plan BREASTFEED A   UTI in pregnancy 03/02/2020   Vs bacteriuria. Treated 1/14. 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 Authorizing Provider  hydrOXYzine (ATARAX/VISTARIL) 50 MG tablet Take 1 tablet (50 mg total) by mouth daily. 12/12/20   11/2015, MD  metroNIDAZOLE (METROGEL) 0.75 % vaginal gel Place 1 Applicatorful vaginally at bedtime. Apply one applicatorful to vagina at bedtime for 5 days 04/17/21   Brock Bad, MD  nitrofurantoin, macrocrystal-monohydrate, (MACROBID) 100 MG capsule Take 1 capsule (100 mg total) by mouth 2 (two) times daily. Patient not taking: Reported on 04/11/2021 02/07/21   04/13/2021, MD  sertraline (ZOLOFT) 50 MG tablet Take 1 tablet (50 mg total) by mouth daily. 12/12/20   Adam Phenix, MD    Family History Family History  Problem Relation Age of Onset  Healthy Mother    Healthy Father    Asthma Sister    Diabetes Maternal Grandmother     Social History Social History   Tobacco Use   Smoking status: Never   Smokeless tobacco: Never  Vaping Use   Vaping Use: Every day  Substance Use Topics   Alcohol use: Not Currently   Drug use: Yes    Types: Marijuana     Allergies   Patient has no known allergies.   Review of Systems Review of Systems  Constitutional:  Negative for chills and fever.  HENT:  Negative for ear pain and sore throat.   Eyes:  Negative for pain and visual disturbance.  Respiratory:  Negative for cough and shortness of breath.   Cardiovascular:  Negative for chest pain and palpitations.  Gastrointestinal:  Positive for abdominal pain. Negative for vomiting.  Genitourinary:  Negative for dysuria and hematuria.  Musculoskeletal:  Negative for arthralgias and back pain.  Skin:  Negative  for color change and rash.  Neurological:  Negative for seizures and syncope.  All other systems reviewed and are negative.    Physical Exam Triage Vital Signs ED Triage Vitals [08/31/21 1058]  Enc Vitals Group     BP 130/85     Pulse Rate 99     Resp 18     Temp 98 F (36.7 C)     Temp Source Oral     SpO2 98 %     Weight      Height      Head Circumference      Peak Flow      Pain Score 0     Pain Loc      Pain Edu?      Excl. in GC?    No data found.  Updated Vital Signs BP 130/85 (BP Location: Left Arm)   Pulse 99   Temp 98 F (36.7 C) (Oral)   Resp 18   SpO2 98%   Breastfeeding No   Visual Acuity Right Eye Distance:   Left Eye Distance:   Bilateral Distance:    Right Eye Near:   Left Eye Near:    Bilateral Near:     Physical Exam Vitals and nursing note reviewed.  Constitutional:      General: She is not in acute distress.    Appearance: She is well-developed.  HENT:     Head: Normocephalic and atraumatic.  Eyes:     Conjunctiva/sclera: Conjunctivae normal.  Cardiovascular:     Rate and Rhythm: Normal rate and regular rhythm.     Heart sounds: No murmur heard. Pulmonary:     Effort: Pulmonary effort is normal. No respiratory distress.     Breath sounds: Normal breath sounds.  Abdominal:     Palpations: Abdomen is soft.     Tenderness: There is no abdominal tenderness. There is no right CVA tenderness or left CVA tenderness.     Comments: Mild lower abdominal pain, mild right lower quadrant TTP.   Musculoskeletal:        General: No swelling.     Cervical back: Neck supple.  Skin:    General: Skin is warm and dry.     Capillary Refill: Capillary refill takes less than 2 seconds.  Neurological:     Mental Status: She is alert.  Psychiatric:        Mood and Affect: Mood normal.      UC Treatments / Results  Labs (all labs ordered are  listed, but only abnormal results are displayed) Labs Reviewed  POCT URINALYSIS DIP (MANUAL ENTRY)   POCT URINE PREGNANCY    EKG   Radiology No results found.  Procedures Procedures (including critical care time)  Medications Ordered in UC Medications - No data to display  Initial Impression / Assessment and Plan / UC Course  I have reviewed the triage vital signs and the nursing notes.  Pertinent labs & imaging results that were available during my care of the patient were reviewed by me and considered in my medical decision making (see chart for details).     Right lower quadrant pain which has been intermittent for 2-3 months. UA normal, urine pregnancy negative.  Pt with mild TTP on exam.   Pt stable at this time, vitals normal, pain mild.  Pain possibly related to ovarian cyst, ovulation pain, or appendicitis.  Advised follow up with PCP this week and ED precautions given.  Final Clinical Impressions(s) / UC Diagnoses   Final diagnoses:  Right lower quadrant abdominal pain     Discharge Instructions      Recommend following up with primary care physician or OBGYN this week If your pain becomes more severe or you develop fever, chills, nausea, vomiting go to the Emergency Department for evaluation.  Drink plenty of fluids Can take Ibuprofen or tylenol as needed   ED Prescriptions   None    PDMP not reviewed this encounter.   Ward, Lenise Arena, PA-C 08/31/21 1156

## 2021-08-31 NOTE — ED Triage Notes (Signed)
Pt c/o intermittent pain to RLQ area onset ~ "a few months back.Marland KitchenMarland Kitchen"

## 2021-09-26 ENCOUNTER — Other Ambulatory Visit (HOSPITAL_COMMUNITY)
Admission: RE | Admit: 2021-09-26 | Discharge: 2021-09-26 | Disposition: A | Discharge status: Home / Self Care | Payer: Medicaid Other | Source: Ambulatory Visit | Attending: Student | Admitting: Student

## 2021-09-26 ENCOUNTER — Ambulatory Visit (INDEPENDENT_AMBULATORY_CARE_PROVIDER_SITE_OTHER): Payer: Medicaid Other | Admitting: Student

## 2021-09-26 VITALS — BP 131/84 | HR 75 | Wt 169.2 lb

## 2021-09-26 DIAGNOSIS — F419 Anxiety disorder, unspecified: Secondary | ICD-10-CM

## 2021-09-26 DIAGNOSIS — N898 Other specified noninflammatory disorders of vagina: Secondary | ICD-10-CM | POA: Diagnosis present

## 2021-09-26 DIAGNOSIS — N926 Irregular menstruation, unspecified: Secondary | ICD-10-CM | POA: Diagnosis not present

## 2021-09-26 DIAGNOSIS — R1031 Right lower quadrant pain: Secondary | ICD-10-CM

## 2021-09-26 DIAGNOSIS — R300 Dysuria: Secondary | ICD-10-CM

## 2021-09-26 NOTE — Progress Notes (Signed)
History:  [name] is a 21 y.o. [phone] who presents to clinic today for:  Pt is in the office for ED follow up from 08/31/21 for RLQ abdominal pain Reports vaginal discharge and odor that has increased over the past 7 days. Patient reports noting clumps or a ball of mucous pass during this same period of time. Reports intermittent right sided pelvic, dull pain 8-9/10, started a 6 months ago. She reports noting the pain 5-6 times within this time frame. No exacerbating factors that she has noticed. The pain last for a couple hours and then goes away. Reports that she received Depo x1 in early 2022(after delivering her baby in February). She stopped receiving Depo because she had irregular bleeding and spotting for 14 weeks straight. Her period resumed to  regular cycles until this previous month where she noticed some irregular bleeding patterns. LMP 08/18/21. She reports her period lasting for 3 days, stopping for 2 and then returning for an additional 2-3 days. Patient also states taking a pregnancy test at the beginning of this month that was negative, but is uncertain due to current symptoms feeling so irregular for her. Patient is open to becoming pregnant and reports having unprotected sex with her current partner for the past 6 months. Patient is not interested in any contraceptive therapy at this time. Patient shares she feels overwhelmed by her current mood changes and doesn't want anything to make it worse.  The following portions of the patient's history were reviewed and updated as appropriate: allergies, current medications, family history, past medical history, social history, past surgical history and problem list.  Review of Systems:  Review of Systems  Constitutional:  Negative for chills, fever and weight loss.  Respiratory:  Negative for cough and shortness of breath.   Cardiovascular:  Negative for chest pain.  Gastrointestinal:  Positive for abdominal pain.  Negative for constipation, nausea and vomiting.  Genitourinary:  Positive for dysuria and frequency. Negative for urgency.  Neurological:  Negative for tingling, speech change, weakness and headaches.  Psychiatric/Behavioral:  Negative for depression. The patient is nervous/anxious.       Objective:  Physical Exam BP 131/84   Pulse 75   Wt 169 lb 3.2 oz (76.7 kg)   LMP 08/18/2021   BMI 33.04 kg/m  Physical Exam Exam conducted with a chaperone present.  Constitutional:      General: She is not in acute distress.    Appearance: Normal appearance.  Cardiovascular:     Rate and Rhythm: Normal rate and regular rhythm.  Pulmonary:     Effort: Pulmonary effort is normal.     Breath sounds: No wheezing.  Abdominal:     General: Abdomen is flat. Bowel sounds are normal.     Palpations: Abdomen is soft.     Tenderness: There is abdominal tenderness in the right lower quadrant. There is no guarding.  Genitourinary:    General: Normal vulva.     Exam position: Lithotomy position.     Pubic Area: No rash.      Vagina: Vaginal discharge present.     Cervix: Discharge present. No friability, lesion or cervical bleeding.     Uterus: Normal.      Adnexa: Right adnexa normal and left adnexa normal.       Right: No tenderness.         Left: No tenderness.       Rectum: Normal.     Comments: White,frothy discharge present Neurological:  Mental Status: She is alert and oriented to person, place, and time. Mental status is at baseline.     Gait: Gait normal.  Psychiatric:        Mood and Affect: Mood normal.        Behavior: Behavior normal.        Thought Content: Thought content normal.        Judgment: Judgment normal.    Labs and Imaging No results found for this or any previous visit (from the past 24 hour(s)).  No results found.   Assessment & Plan:  1. Vaginal discharge - Cervicovaginal ancillary only( Jud) - HIV antibody (with reflex) - Hepatitis B Surface  AntiGEN - Hepatitis C Antibody - RPR - US PELVIS TRANSVAGINAL NON-OB (TV ONLY); Future - Discussed health and recommended feminine hygiene measures to prevent vaginitis in the future.  2. Anxiety 3. Missed period - Concerns regarding potential pregnancy as well as desire to become pregnant addressed. Discussed with patient that infertility management/workup would be indicated after 12 months of actively trying with same partner. Discussed normal ovulation periods for a patient that is having regular cycles. Due to uncertain health history and current changes in cycle and reported abdominal pain, beta hCG drawn today. Patient is not presenting in acute distress. Absent of fever, abdominal pain at rest, or current vaginal bleeding. - Beta hCG quant (ref lab)  4. RLQ abdominal pain - Pain and tenderness to deep palpation of RLQ. Patient does not have reproducible tenderness in her adnexal region today, but due to reports of  having pain in that area at other times (always on her right side) TVUS ordered to rule-out any gynecological cause of current symptoms.Patient has been counseled on the importance of follow-up with her PCP as well. - US PELVIS TRANSVAGINAL NON-OB (TV ONLY); Future  5. Dysuria - Increased frequency and symptoms of inadequate bladder emptying. In setting of these symptoms and intermittent abdominal pain would like to rule out UTI. - Urine Culture - Urinalysis  Approximately 75% of 30 minutes of face-to-face time was spent with this patient providing counseling and coordination of care.   Corlis Hove, NP 09/26/2021

## 2021-09-26 NOTE — Progress Notes (Signed)
Pt is in the office for ED follow up.  Reports vaginal discharge and odor today. Reports intermittent right sided pelvic dull pain 8-9/10, started a few months ago. Reports that she normally has regular cycles but this month bleeding was irregular.

## 2021-09-27 ENCOUNTER — Ambulatory Visit
Admission: RE | Admit: 2021-09-27 | Discharge: 2021-09-27 | Disposition: A | Discharge status: Home / Self Care | Payer: Medicaid Other | Source: Ambulatory Visit | Attending: Student | Admitting: Student

## 2021-09-27 ENCOUNTER — Other Ambulatory Visit: Payer: Self-pay | Admitting: Student

## 2021-09-27 DIAGNOSIS — N898 Other specified noninflammatory disorders of vagina: Secondary | ICD-10-CM

## 2021-09-27 DIAGNOSIS — B9689 Other specified bacterial agents as the cause of diseases classified elsewhere: Secondary | ICD-10-CM

## 2021-09-27 DIAGNOSIS — R1031 Right lower quadrant pain: Secondary | ICD-10-CM

## 2021-09-27 LAB — URINALYSIS
Bilirubin, UA: NEGATIVE
Glucose, UA: NEGATIVE
Ketones, UA: NEGATIVE
Leukocytes,UA: NEGATIVE
Nitrite, UA: NEGATIVE
Protein,UA: NEGATIVE
RBC, UA: NEGATIVE
Specific Gravity, UA: 1.028 (ref 1.005–1.030)
Urobilinogen, Ur: 0.2 mg/dL (ref 0.2–1.0)
pH, UA: 6 (ref 5.0–7.5)

## 2021-09-27 LAB — BETA HCG QUANT (REF LAB): hCG Quant: <1 m[IU]/mL

## 2021-09-27 LAB — HEPATITIS C ANTIBODY: Hep C Virus Ab: NONREACTIVE

## 2021-09-27 LAB — CERVICOVAGINAL ANCILLARY ONLY
Bacterial Vaginitis (gardnerella): POSITIVE — AB
Candida Glabrata: NEGATIVE
Candida Vaginitis: NEGATIVE
Chlamydia: NEGATIVE
Comment: NEGATIVE
Comment: NEGATIVE
Comment: NEGATIVE
Comment: NEGATIVE
Comment: NEGATIVE
Comment: NORMAL
Neisseria Gonorrhea: NEGATIVE
Trichomonas: NEGATIVE

## 2021-09-27 LAB — HIV ANTIBODY (ROUTINE TESTING W REFLEX): HIV Screen 4th Generation wRfx: NONREACTIVE

## 2021-09-27 LAB — HEPATITIS B SURFACE ANTIGEN: Hepatitis B Surface Ag: NEGATIVE

## 2021-09-27 LAB — RPR: RPR Ser Ql: NONREACTIVE

## 2021-09-27 MED ORDER — METRONIDAZOLE 500 MG PO TABS: 500.0000 mg | ORAL_TABLET | Freq: Two times a day (BID) | ORAL | 0 refills | Status: DC

## 2021-09-28 LAB — URINE CULTURE

## 2021-10-02 ENCOUNTER — Other Ambulatory Visit: Payer: Self-pay | Admitting: Student

## 2021-10-02 DIAGNOSIS — R8271 Bacteriuria: Secondary | ICD-10-CM

## 2021-10-02 MED ORDER — AMPICILLIN 500 MG PO CAPS: 500.0000 mg | ORAL_CAPSULE | Freq: Four times a day (QID) | ORAL | 0 refills | Status: DC

## 2021-10-07 ENCOUNTER — Ambulatory Visit (HOSPITAL_COMMUNITY)
Admission: EM | Admit: 2021-10-07 | Discharge: 2021-10-07 | Disposition: A | Discharge status: Home / Self Care | Payer: Medicaid Other | Attending: Urgent Care | Admitting: Urgent Care

## 2021-10-07 ENCOUNTER — Encounter (HOSPITAL_COMMUNITY): Payer: Self-pay

## 2021-10-07 ENCOUNTER — Ambulatory Visit (INDEPENDENT_AMBULATORY_CARE_PROVIDER_SITE_OTHER): Payer: Medicaid Other

## 2021-10-07 DIAGNOSIS — R42 Dizziness and giddiness: Secondary | ICD-10-CM | POA: Diagnosis not present

## 2021-10-07 DIAGNOSIS — M7918 Myalgia, other site: Secondary | ICD-10-CM

## 2021-10-07 DIAGNOSIS — R5383 Other fatigue: Secondary | ICD-10-CM

## 2021-10-07 MED ORDER — TIZANIDINE HCL 4 MG PO TABS: 4.0000 mg | ORAL_TABLET | Freq: Three times a day (TID) | ORAL | 0 refills | Status: DC | PRN

## 2021-10-07 NOTE — Discharge Instructions (Signed)
Your cervical spine x-ray shows loss of normal load or cysts, and a very straight spine.  This may in part be leading to some of your lightheadedness.  I would recommend a chiropractor evaluation. I have prescribed you tizanidine, muscle relaxer, to help with the pain of your right shoulder.  A microwavable heat pad, or a warm washcloth may also help relax the muscle. Please take your Atarax as previously prescribed for your anxiety.  Regarding your fatigue, please discuss this with your PCP.  A sleep study may be beneficial.  Additional labs such as a vitamin D, B12, folate, PTH, and Lyme test may be beneficial.

## 2021-10-07 NOTE — ED Provider Notes (Signed)
[facility]    CSN: [id number] Arrival date & time: 10/07/21  1730      History   Chief Complaint Chief Complaint  Patient presents with   Palpitations   Shortness of Breath   Altered Mental Status    HPI Victoria Garrett is a 21 y.o. female.   Pleasant 21 year old female presents today due to concerns primarily of lightheadedness, fatigue, and a tingling burning pain to her left scapula.  She states that she has been "lightheaded for nearly all of my life".  States she has just "learned to live with it".  Denies any significant work-up in the past.  Has had labs, numerous labs done between June and now, was told to start taking a multivitamin with iron, despite having a normal iron level.  She also states she has a known history of anxiety, used to be on Zoloft but is no longer taking.  Does have Atarax at home that she takes intermittently for severe panic, but does not like the side effect of sedation.  She presented today primarily because she felt like her lightheadedness was slightly worse.  She describes it as an "out of body encounter in which I am raised up to the sky, and lowered back down to my body".  She denies the room spinning.  She denies syncope.  She denies gait disturbance.  She has not had any head trauma.  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  She denies any heavy lifting.  Patient also is concerned with her degree of fatigue.  It was recommended at 1 point she get a sleep study, but this was never done.  She is currently on ampicillin for a group B strep UTI, but has tolerated penicillins in the past.  Lastly, patient describes a burning tingling pain to her left scapula intermittently.  No known injury or trauma.     Past Medical History:  Diagnosis Date   Anemia    Benign gestational thrombocytopenia in third trimester (HCC) 02/28/2020   Plts 135 at new OB, then 127 at 28 weeks Check platelets at 36 week visit [ ]     Heart  murmur    as a child    Preterm uterine contractions in third trimester, antepartum 02/28/2020   Supervision of other normal pregnancy, antepartum 02/13/2020    Nursing Staff Provider Office Location [facility]  Dating   6w 5d 02/15/2020 Language  ENGLISH Anatomy US   WNL visible in Care everywhere 10/2019 Flu Vaccine  02/14/2020 Genetic Screen  NIPS:   AFP:   First Screen:  Quad:   TDaP Vaccine   02/14/2020 Hgb A1C or  GTT Normal 1 hour 01/04/2020 per [facility] COVID Vaccine NOT VACCINATED   LAB RESULTS  Rhogam   Blood Type    B POS Feeding Plan BREASTFEED A   UTI in pregnancy 03/02/2020   Vs bacteriuria. Treated 1/14. 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 Authorizing Provider  ampicillin (PRINCIPEN) 500 MG capsule Take 1 capsule (500 mg total) by mouth 4 (four) times daily. 1 po QID x 7 days 10/02/21  Yes Corlis Hove, NP  metroNIDAZOLE (FLAGYL) 500 MG tablet Take 1 tablet (500 mg total) by mouth 2 (two) times daily. 09/27/21  Yes Corlis Hove, NP  tiZANidine (ZANAFLEX) 4 MG tablet Take 1 tablet (4 mg total) by mouth every 8  (eight) hours as needed for muscle spasms (neck tension). SEDATION PRECAUTION 10/07/21  Yes Precious Segall L, PA  hydrOXYzine (ATARAX/VISTARIL) 50 MG tablet Take 1 tablet (50 mg total) by mouth daily. Patient not taking: Reported on 09/26/2021 12/12/20   Brock Bad, MD    Family History Family History  Problem Relation Age of Onset   Healthy Mother    Healthy Father    Asthma Sister    Diabetes Maternal Grandmother     Social History Social History   Tobacco Use   Smoking status: Never   Smokeless tobacco: Never  Vaping Use   Vaping Use: Some days  Substance Use Topics   Alcohol use: Not Currently   Drug use: Yes    Types: Marijuana     Allergies   Patient has no known allergies.   Review of Systems Review of Systems As per HPI  Physical Exam Triage Vital Signs ED Triage Vitals  Enc Vitals Group     BP 10/07/21 1859 137/85     Pulse Rate 10/07/21 1859 72     Resp 10/07/21 1859 16     Temp 10/07/21 1859 99 F (37.2 C)     Temp Source 10/07/21 1859 Oral     SpO2 10/07/21 1859 97 %     Weight 10/07/21 1903 169 lb (76.7 kg)     Height 10/07/21 1903 5' (1.524 m)     Head Circumference --      Peak Flow --      Pain Score 10/07/21 1903 0     Pain Loc --      Pain Edu? --      Excl. in GC? --    No data found.  Updated Vital Signs BP 137/85 (BP Location: Left Arm)   Pulse 72   Temp 99 F (37.2 C) (Oral)   Resp 16   Ht 5' (1.524 m)   Wt 169 lb (76.7 kg)   LMP 09/14/2021 (Approximate)   SpO2 97%   Breastfeeding No   BMI 33.01 kg/m   Visual Acuity Right Eye Distance:   Left Eye Distance:   Bilateral Distance:    Right Eye Near:   Left Eye Near:    Bilateral Near:     Physical Exam Vitals and nursing note reviewed.  Constitutional:      General: She is not in acute distress.    Appearance: She is well-developed. She is obese.  She is not ill-appearing, toxic-appearing or diaphoretic.  HENT:     Head: Normocephalic and atraumatic.      Mouth/Throat:     Mouth: Mucous membranes are moist.     Pharynx: Oropharynx is clear. No pharyngeal swelling or oropharyngeal exudate.  Eyes:     Extraocular Movements: Extraocular movements intact.     Conjunctiva/sclera: Conjunctivae normal.     Pupils: Pupils are equal, round, and reactive to light.  Neck:     Thyroid: No thyromegaly.  Cardiovascular:     Rate and Rhythm: Normal rate and regular rhythm. No extrasystoles are present.    Heart sounds: No murmur heard. Pulmonary:     Effort: Pulmonary effort is normal. No respiratory distress.     Breath sounds: Normal breath sounds. No decreased breath sounds, wheezing, rhonchi or rales.  Chest:     Chest wall: No mass, deformity, tenderness, crepitus or edema. There is no dullness to percussion.  Abdominal:     General: Bowel sounds are normal.     Palpations: Abdomen is soft.     Tenderness: There is no abdominal tenderness.  Musculoskeletal:        General: No swelling.     Cervical back: Normal range of motion and neck supple.     Comments: Tenderness to R knotted, tense trapezius  Lymphadenopathy:     Cervical: No cervical adenopathy.  Skin:    General: Skin is warm and dry.     Capillary Refill: Capillary refill takes less than 2 seconds.     Coloration: Skin is not cyanotic.     Findings: No erythema.     Nails: There is no clubbing.  Neurological:     Mental Status: She is alert.  Psychiatric:        Mood and Affect: Mood normal.      UC Treatments / Results  Labs (all labs ordered are listed, but only abnormal results are displayed) Labs Reviewed - No data to display  EKG   Radiology DG Cervical Spine Complete  Result Date: 10/07/2021 CLINICAL DATA:  Lightheaded EXAM: CERVICAL SPINE - COMPLETE 4+ VIEW COMPARISON:  None Available. FINDINGS: Straightening of the cervical spine. Vertebral body heights and disc spaces appear within normal limits. The foramen appear patent bilaterally. The dens and lateral  masses are obscured by teeth. IMPRESSION: Straightening of the cervical spine.  No acute osseous abnormality. Electronically Signed   By: Jasmine Pang M.D.   On: 10/07/2021 19:36    Procedures Procedures (including critical care time)  Medications Ordered in UC Medications - No data to display  Initial Impression / Assessment and Plan / UC Course  I have reviewed the triage vital signs and the nursing notes.  Pertinent labs & imaging results that were available during my care of the patient were reviewed by me and considered in my medical decision making (see chart for details).     Myofascial pain syndrome -patient with reproducible pain to her right trapezius.  Suspect this to be a muscle knot.  Will prescribe a muscle relaxer, also recommended moist heat.  Lightheadedness -vital signs stable.  Neuro exam normal.  Patient with recent labs drawn at her PCP which are unremarkable.  Cervical spine x-ray obtained to determine if this could be a C-spine issue.  Significant loss of lordosis was noted, question if these encounters are actually cervicogenic dizziness.  Recommended she see a chiropractor, and follow-up with her PCP for a possible referral. Fatigue -encourage patient to follow-up  with her PCP, additional lab work recommended.  I also think a sleep test would be beneficial.  Final Clinical Impressions(s) / UC Diagnoses   Final diagnoses:  Myofascial pain syndrome, cervical  Other fatigue  Lightheadedness     Discharge Instructions      Your cervical spine x-ray shows loss of normal load or cysts, and a very straight spine.  This may in part be leading to some of your lightheadedness.  I would recommend a chiropractor evaluation. I have prescribed you tizanidine, muscle relaxer, to help with the pain of your right shoulder.  A microwavable heat pad, or a warm washcloth may also help relax the muscle. Please take your Atarax as previously prescribed for your anxiety.   Regarding your fatigue, please discuss this with your PCP.  A sleep study may be beneficial.  Additional labs such as a vitamin D, B12, folate, PTH, and Lyme test may be beneficial.     ED Prescriptions     Medication Sig Dispense Auth. Provider   tiZANidine (ZANAFLEX) 4 MG tablet Take 1 tablet (4 mg total) by mouth every 8 (eight) hours as needed for muscle spasms (neck tension). SEDATION PRECAUTION 30 tablet Cecelia Graciano L, PA      PDMP not reviewed this encounter.   Maretta Bees, [place] 10/07/21 2057

## 2021-10-07 NOTE — ED Triage Notes (Signed)
Patient states she is always lightheaded but has been more severe for the last 4 days. Patient states it feels like an out of body experience where she goes up in the air. States she has been spacing out a lot.   Left side of the upper back is numb and tingling. Radiates to the chest. Patient is feeling SOB. Onset today having racing heart beat and numbness in the chest. Patient diagnosed with anxiety and it feels like the anxiety attacks.   Patient having hot flashes and shaking today as well. Felt like her head was spinning and like she was going to fall.   Patient currently has a UTI and is taking antibiotics. This is not the first time taking the medication, started the meds 3 days ago.

## 2021-10-30 ENCOUNTER — Ambulatory Visit: Payer: Medicaid Other | Admitting: Student

## 2021-12-03 ENCOUNTER — Ambulatory Visit: Payer: Medicaid Other | Admitting: Student

## 2021-12-23 ENCOUNTER — Ambulatory Visit (INDEPENDENT_AMBULATORY_CARE_PROVIDER_SITE_OTHER): Payer: Medicaid Other | Admitting: *Deleted

## 2021-12-23 VITALS — BP 135/89 | HR 71

## 2021-12-23 DIAGNOSIS — Z3480 Encounter for supervision of other normal pregnancy, unspecified trimester: Secondary | ICD-10-CM

## 2021-12-23 DIAGNOSIS — Z3201 Encounter for pregnancy test, result positive: Secondary | ICD-10-CM | POA: Diagnosis not present

## 2021-12-23 DIAGNOSIS — Z3200 Encounter for pregnancy test, result unknown: Secondary | ICD-10-CM

## 2021-12-23 LAB — POCT URINE PREGNANCY: Preg Test, Ur: POSITIVE — AB

## 2021-12-23 MED ORDER — PROMETHAZINE HCL 25 MG PO TABS: 25.0000 mg | ORAL_TABLET | Freq: Four times a day (QID) | ORAL | 1 refills | Status: DC | PRN

## 2021-12-23 MED ORDER — VITAFOL GUMMIES 3.33-0.333-34.8 MG PO CHEW: 1.0000 | CHEWABLE_TABLET | Freq: Every day | ORAL | 5 refills | Status: DC

## 2021-12-23 NOTE — Progress Notes (Signed)
Ms. Zill presents today for UPT. She has no unusual complaints. LMP: 11/16/21    OBJECTIVE: Appears well, in no apparent distress.  OB History     Gravida  2   Para  2   Term  2   Preterm      AB      Living  2      SAB      IAB      Ectopic      Multiple  0   Live Births  2          Home UPT Result: positive In-Office UPT result: positive I have reviewed the patient's medical, obstetrical, social, and family histories, and medications.   ASSESSMENT: Positive pregnancy test  PLAN Prenatal care to be completed at: [place]. US/ Intake scheduled at check out.

## 2021-12-25 ENCOUNTER — Inpatient Hospital Stay (HOSPITAL_COMMUNITY): Payer: Medicaid Other

## 2021-12-25 ENCOUNTER — Encounter (HOSPITAL_COMMUNITY): Payer: Self-pay | Admitting: Obstetrics and Gynecology

## 2021-12-25 ENCOUNTER — Telehealth: Payer: Self-pay | Admitting: Emergency Medicine

## 2021-12-25 ENCOUNTER — Inpatient Hospital Stay (HOSPITAL_COMMUNITY)
Admission: AD | Admit: 2021-12-25 | Discharge: 2021-12-25 | Disposition: A | Discharge status: Home / Self Care | Payer: Medicaid Other | Attending: Obstetrics and Gynecology | Admitting: Obstetrics and Gynecology

## 2021-12-25 ENCOUNTER — Ambulatory Visit: Payer: Self-pay

## 2021-12-25 DIAGNOSIS — O26891 Other specified pregnancy related conditions, first trimester: Secondary | ICD-10-CM | POA: Diagnosis present

## 2021-12-25 DIAGNOSIS — Z3A01 Less than 8 weeks gestation of pregnancy: Secondary | ICD-10-CM | POA: Diagnosis not present

## 2021-12-25 DIAGNOSIS — O3680X0 Pregnancy with inconclusive fetal viability, not applicable or unspecified: Secondary | ICD-10-CM

## 2021-12-25 DIAGNOSIS — O4690 Antepartum hemorrhage, unspecified, unspecified trimester: Secondary | ICD-10-CM | POA: Diagnosis not present

## 2021-12-25 DIAGNOSIS — O209 Hemorrhage in early pregnancy, unspecified: Secondary | ICD-10-CM | POA: Diagnosis not present

## 2021-12-25 DIAGNOSIS — B9689 Other specified bacterial agents as the cause of diseases classified elsewhere: Secondary | ICD-10-CM

## 2021-12-25 LAB — WET PREP, GENITAL
Sperm: NONE SEEN
Trich, Wet Prep: NONE SEEN
WBC, Wet Prep HPF POC: <10 (ref <10)
Yeast Wet Prep HPF POC: NONE SEEN

## 2021-12-25 LAB — CBC
HCT: 38 % (ref 36.0–46.0)
Hemoglobin: 13.7 g/dL (ref 12.0–15.0)
MCH: 29.7 pg (ref 26.0–34.0)
MCHC: 36.1 g/dL — ABNORMAL HIGH (ref 30.0–36.0)
MCV: 82.3 fL (ref 80.0–100.0)
Platelets: 163 10*3/uL (ref 150–400)
RBC: 4.62 MIL/uL (ref 3.87–5.11)
RDW: 12.2 % (ref 11.5–15.5)
WBC: 6.9 10*3/uL (ref 4.0–10.5)
nRBC: 0 % (ref 0.0–0.2)

## 2021-12-25 LAB — URINALYSIS, ROUTINE W REFLEX MICROSCOPIC
Bilirubin Urine: NEGATIVE
Glucose, UA: NEGATIVE mg/dL
Ketones, ur: NEGATIVE mg/dL
Leukocytes,Ua: NEGATIVE
Nitrite: NEGATIVE
Protein, ur: 30 mg/dL — AB
Specific Gravity, Urine: 1.021 (ref 1.005–1.030)
pH: 5 (ref 5.0–8.0)

## 2021-12-25 LAB — HCG, QUANTITATIVE, PREGNANCY: hCG, Beta Chain, Quant, S: 24 m[IU]/mL — ABNORMAL HIGH (ref <5)

## 2021-12-25 LAB — COMPREHENSIVE METABOLIC PANEL
ALT: 23 U/L (ref 0–44)
AST: 17 U/L (ref 15–41)
Albumin: 4.1 g/dL (ref 3.5–5.0)
Alkaline Phosphatase: 39 U/L (ref 38–126)
Anion gap: 8 (ref 5–15)
BUN: 7 mg/dL (ref 6–20)
CO2: 24 mmol/L (ref 22–32)
Calcium: 9.2 mg/dL (ref 8.9–10.3)
Chloride: 110 mmol/L (ref 98–111)
Creatinine, Ser: 0.56 mg/dL (ref 0.44–1.00)
GFR, Estimated: >60 mL/min (ref >60)
Glucose, Bld: 95 mg/dL (ref 70–99)
Potassium: 3.6 mmol/L (ref 3.5–5.1)
Sodium: 142 mmol/L (ref 135–145)
Total Bilirubin: 0.6 mg/dL (ref 0.3–1.2)
Total Protein: 7.1 g/dL (ref 6.5–8.1)

## 2021-12-25 MED ORDER — METRONIDAZOLE 0.75 % VA GEL: 1.0000 | Freq: Every day | VAGINAL | 1 refills | Status: DC

## 2021-12-25 NOTE — MAU Provider Note (Signed)
History     CSN: [id number]  Arrival date and time: 12/25/21 1651   Event Date/Time   First Provider Initiated Contact with Patient 12/25/21 1820      Chief Complaint  Patient presents with   Vaginal Bleeding   Pelvic Pain   Victoria Garrett is a 21 y.o. G3P2002 at [redacted]w[redacted]d by definite LMP of Sept 30, 2023 who receives care at [facility].  She presents today for Vaginal Bleeding and Pelvic Pain.  She states she started having bleeding today around 1400 and called her ob office.  She states initially it was light, but then progressed to brighter red with "blood particles floating in the toilet and some weird looking thing in their." She reports the bleeding is heavier now and that the pelvic pain is present, but more of a soreness.  She states that it is worse with walking, but none when resting.  She rates the pain a 2/10. She reports feeling like she is not completely emptying her bladder when urinating, but denies pain.  She states this started Friday.  She reports some vaginal discharge that was milky white with sourdough odor. She states it has been present "for a while" and notes that she has been having "BV back to back."  She states she has been treated.     OB History     Gravida  3   Para  2   Term  2   Preterm      AB      Living  2      SAB      IAB      Ectopic      Multiple  0   Live Births  2           Past Medical History:  Diagnosis Date   Anemia    Benign gestational thrombocytopenia in third trimester (HCC) 02/28/2020   Plts 135 at new OB, then 127 at 28 weeks Check platelets at 36 week visit [ ]     Heart murmur    as a child    Preterm uterine contractions in third trimester, antepartum 02/28/2020   Supervision of other normal pregnancy, antepartum 02/13/2020    Nursing Staff Provider Office Location [facility]  Dating   6w 5d 02/15/2020 Language  ENGLISH Anatomy US   WNL visible in Care everywhere 10/2019 Flu Vaccine  02/14/2020 Genetic Screen   NIPS:   AFP:   First Screen:  Quad:   TDaP Vaccine   02/14/2020 Hgb A1C or  GTT Normal 1 hour 01/04/2020 per [facility] COVID Vaccine NOT VACCINATED   LAB RESULTS  Rhogam   Blood Type    B POS Feeding Plan BREASTFEED A   UTI in pregnancy 03/02/2020   Vs bacteriuria. Treated 1/14. [ ]  TOC    Past Surgical History:  Procedure Laterality Date   IR NEPHROURETERAL CATH PLACE RIGHT Right 10/2015   NEPHROSTOMY TUBE PLACEMENT ([facility] HX)      Family History  Problem Relation Age of Onset   Healthy Mother    Healthy Father    Asthma Sister    Diabetes Maternal Grandmother     Social History   Tobacco Use   Smoking status: Never   Smokeless tobacco: Never  Vaping Use   Vaping Use: Some days  Substance Use Topics   Alcohol use: Not Currently   Drug use: Not Currently    Types: Marijuana    Allergies: No Known Allergies  Medications Prior  to Admission  Medication Sig Dispense Refill Last Dose   ampicillin (PRINCIPEN) 500 MG capsule Take 1 capsule (500 mg total) by mouth 4 (four) times daily. 1 po QID x 7 days 28 capsule 0    hydrOXYzine (ATARAX/VISTARIL) 50 MG tablet Take 1 tablet (50 mg total) by mouth daily. (Patient not taking: Reported on 09/26/2021) 30 tablet 1    metroNIDAZOLE (FLAGYL) 500 MG tablet Take 1 tablet (500 mg total) by mouth 2 (two) times daily. 14 tablet 0    Prenatal Vit-Fe Phos-FA-Omega (VITAFOL GUMMIES) 3.33-0.333-34.8 MG CHEW Chew 1 tablet by mouth daily. 90 tablet 5    promethazine (PHENERGAN) 25 MG tablet Take 1 tablet (25 mg total) by mouth every 6 (six) hours as needed for nausea or vomiting. 30 tablet 1    tiZANidine (ZANAFLEX) 4 MG tablet Take 1 tablet (4 mg total) by mouth every 8 (eight) hours as needed for muscle spasms (neck tension). SEDATION PRECAUTION 30 tablet 0     Review of Systems  Gastrointestinal:  Negative for abdominal pain, nausea and vomiting.  Genitourinary:  Positive for pelvic pain and vaginal bleeding. Negative for difficulty  urinating, dysuria and vaginal discharge.       Inadequate urinary emptying  Neurological:  Negative for dizziness, light-headedness and headaches.   Physical Exam   Blood pressure 129/85, pulse 78, temperature 98.4 F (36.9 C), temperature source Oral, resp. rate 15, height 5\' 2"  (1.575 m), weight 76.5 kg, last menstrual period 11/16/2021, SpO2 99 %, not currently breastfeeding.  Physical Exam Vitals reviewed.  Constitutional:      Appearance: Normal appearance.  HENT:     Head: Normocephalic and atraumatic.  Eyes:     Conjunctiva/sclera: Conjunctivae normal.  Cardiovascular:     Rate and Rhythm: Normal rate.  Pulmonary:     Effort: Pulmonary effort is normal. No respiratory distress.     Breath sounds: Normal breath sounds.  Abdominal:     General: Bowel sounds are normal.     Palpations: Abdomen is soft.     Tenderness: There is no abdominal tenderness.  Musculoskeletal:        General: Normal range of motion.     Cervical back: Normal range of motion.  Skin:    General: Skin is warm and dry.  Neurological:     Mental Status: She is alert and oriented to person, place, and time.  Psychiatric:        Behavior: Behavior normal.     MAU Course  Procedures Results for orders placed or performed during the hospital encounter of 12/25/21 (from the past 24 hour(s))  CBC     Status: Abnormal   Collection Time: 12/25/21  5:29 PM  Result Value Ref Range   WBC 6.9 4.0 - 10.5 K/uL   RBC 4.62 3.87 - 5.11 MIL/uL   Hemoglobin 13.7 12.0 - 15.0 g/dL   HCT 13/08/23 96.0 - 45.4 %   MCV 82.3 80.0 - 100.0 fL   MCH 29.7 26.0 - 34.0 pg   MCHC 36.1 (H) 30.0 - 36.0 g/dL   RDW 09.8 11.9 - 14.7 %   Platelets 163 150 - 400 K/uL   nRBC 0.0 0.0 - 0.2 %  hCG, quantitative, pregnancy     Status: Abnormal   Collection Time: 12/25/21  5:29 PM  Result Value Ref Range   hCG, Beta Chain, Quant, S 24 (H) <5 mIU/mL  Comprehensive metabolic panel     Status: None   Collection Time: 12/25/21  5:29  PM  Result Value Ref Range   Sodium 142 135 - 145 mmol/L   Potassium 3.6 3.5 - 5.1 mmol/L   Chloride 110 98 - 111 mmol/L   CO2 24 22 - 32 mmol/L   Glucose, Bld 95 70 - 99 mg/dL   BUN 7 6 - 20 mg/dL   Creatinine, Ser 3.50 0.44 - 1.00 mg/dL   Calcium 9.2 8.9 - 09.3 mg/dL   Total Protein 7.1 6.5 - 8.1 g/dL   Albumin 4.1 3.5 - 5.0 g/dL   AST 17 15 - 41 U/L   ALT 23 0 - 44 U/L   Alkaline Phosphatase 39 38 - 126 U/L   Total Bilirubin 0.6 0.3 - 1.2 mg/dL   GFR, Estimated >81 >82 mL/min   Anion gap 8 5 - 15  Wet prep, genital     Status: Abnormal   Collection Time: 12/25/21  5:40 PM   Specimen: PATH Cytology Cervicovaginal Ancillary Only  Result Value Ref Range   Yeast Wet Prep HPF POC NONE SEEN NONE SEEN   Trich, Wet Prep NONE SEEN NONE SEEN   Clue Cells Wet Prep HPF POC PRESENT (A) NONE SEEN   WBC, Wet Prep HPF POC <10 <10   Sperm NONE SEEN   Urinalysis, Routine w reflex microscopic Urine, Clean Catch     Status: Abnormal   Collection Time: 12/25/21  6:10 PM  Result Value Ref Range   Color, Urine YELLOW YELLOW   APPearance HAZY (A) CLEAR   Specific Gravity, Urine 1.021 1.005 - 1.030   pH 5.0 5.0 - 8.0   Glucose, UA NEGATIVE NEGATIVE mg/dL   Hgb urine dipstick LARGE (A) NEGATIVE   Bilirubin Urine NEGATIVE NEGATIVE   Ketones, ur NEGATIVE NEGATIVE mg/dL   Protein, ur 30 (A) NEGATIVE mg/dL   Nitrite NEGATIVE NEGATIVE   Leukocytes,Ua NEGATIVE NEGATIVE   RBC / HPF 6-10 0 - 5 RBC/hpf   WBC, UA 6-10 0 - 5 WBC/hpf   Bacteria, UA FEW (A) NONE SEEN   Squamous Epithelial / LPF 6-10 0 - 5   Mucus PRESENT     MDM Labs: Wet Prep, GC/CT Labs: CBC, CMP, UA Ultrasound Prescription Coordination of Follow Up care Assessment and Plan  21 year old G3P2002 at 5.4 weeks Vaginal Bleeding  -POC Reviewed. -Labs ordered. -Cultures collected by self-swab. -Send for [place] and await results.   Cherre Robins 12/25/2021, 6:20 PM   Reassessment (7:18 PM)  -Results as above. -Discussed need  to return to MAU in 2 days for repeat hCG. Scheduled for MAU after 5pm.  -Discussed ways to prevent bacterial vaginosis:  +Wear cotton underwear +Use low scent or scent free soaps and detergents +No douching -Informed treatment would be vaginal as patient has had oral in the past.  Patient agreeable. -Precautions reviewed. -Informed of UA with +bacteria and need for culture. Plan to treat accordingly. -Encouraged to call primary office or return to MAU if symptoms worsen or with the onset of new symptoms. -Discharged to home in stable condition.  Cherre Robins MSN, CNM Advanced Practice Provider, [facility]

## 2021-12-25 NOTE — Telephone Encounter (Signed)
TC from patient reporting red blood in her underwear, in the toilet when she uses the bathroom and when she wipes. Also reports a mild abdominal cramping that is worsening. Pt instructed to present to MAU for evaluation for pregnancy viability.

## 2021-12-25 NOTE — Telephone Encounter (Signed)
  Chief Complaint: vaginal bleeding Symptoms: vaginal spotting, lightheaded Frequency: today Pertinent Negatives: Patient denies pain or cramping Disposition: [x] ED /[] Urgent Care (no appt availability in office) / [] Appointment(In office/virtual)/ []  [place] [facility]/ [] Home Care/ [] Refused Recommended Disposition /[] [place] [facility]/ []  Follow-up with PCP Additional Notes: pt states that she passed blood when going to bathroom and unsure if she passed tissue or not but is concerned since it looks different than blood and hasn't had this happen in her previous 2 pregnancies. Pt called OB around 1430 but hadn't heard back. At first advised pt to monitor and wait for OB to CB but pt seemed concerned and sent a photo of what the toilet water looked liked to me in an email and I advised her to go ahead and go to MAU at [facility] to be seen. Pt verbalized understanding.   Reason for Disposition  Passed tissue (e.g., gray-white)  Answer Assessment - Initial Assessment Questions 1. ONSET: "When did this bleeding start?"       today 2. DESCRIPTION: "Describe the bleeding that you are having." "How much bleeding is there?"    - SPOTTING: spotting, or pinkish / brownish mucous discharge; does not fill panty liner or pad    - MILD:  less than 1 pad / hour; less than patient's usual menstrual bleeding   - MODERATE: 1-2 pads / hour; 1 menstrual cup every 6 hours; small-medium blood clots (e.g., pea, grape, small coin)   - SEVERE: soaking 2 or more pads/hour for 2 or more hours; 1 menstrual cup every 2 hours; bleeding not contained by pads or continuous red blood from vagina; large blood clots (e.g., golf ball, large coin)      Spotting  3. ABDOMINAL PAIN SEVERITY: If present, ask: "How bad is it?"  (e.g., Scale 1-10; mild, moderate, or severe)   - MILD (1-3): doesn't interfere with normal activities, abdomen soft and not tender to touch    - MODERATE (4-7): interferes with normal activities or  awakens from sleep, abdomen tender to touch    - SEVERE (8-10): excruciating pain, doubled over, unable to do any normal activities     no 4. PREGNANCY: "Do you know how many weeks or months pregnant you are?" "When was the first day of your last normal menstrual period?"     [redacted]w[redacted]d 6. OTHER SYMPTOMS: "What other symptoms are you having with the bleeding?" (e.g., passed tissue, vaginal discharge, fever, menstrual-type cramps)     Lightheaded  Protocols used: Pregnancy - Vaginal Bleeding Less Than [redacted] Weeks EGA-A-AH

## 2021-12-25 NOTE — MAU Note (Signed)
...  Victoria Garrett is a 21 y.o. at [redacted]w[redacted]d here in MAU reporting: Around an hour and a half ago she went to the restroom and when she wiped she noted light pink vaginal bleeding on her tissue. She reports thirty minutes later she coughed and felt a gush of blood so she went to use the restroom again and noted a dark blood clot at the bottom of the toilet with "possible tissue" as well. She reports around this time she also began experiencing constant pelvic pain that feels like a cramp. She reports she has also been experiencing occasional light headedness since last week. She reports over the last two days she has been experiencing urinary frequency and states when she urinates she only "trickles" and when she is finished she still feels as if she has to urinate. No burning with urination. Endorses vaginal odors. Reports she gets BV recurrently. Denies vaginal itching.  LMP: 11/16/2021 Onset of complaint: Today at  Pain score: 2/10 pelvis  Lab orders placed from triage: UA

## 2021-12-26 LAB — GC/CHLAMYDIA PROBE AMP (~~LOC~~) NOT AT ARMC
Chlamydia: NEGATIVE
Comment: NEGATIVE
Comment: NORMAL
Neisseria Gonorrhea: NEGATIVE

## 2021-12-27 ENCOUNTER — Encounter (HOSPITAL_COMMUNITY): Payer: Self-pay

## 2021-12-27 ENCOUNTER — Inpatient Hospital Stay (HOSPITAL_COMMUNITY)
Admission: AD | Admit: 2021-12-27 | Discharge: 2021-12-27 | Disposition: A | Discharge status: Home / Self Care | Payer: Medicaid Other | Attending: Obstetrics & Gynecology | Admitting: Obstetrics & Gynecology

## 2021-12-27 ENCOUNTER — Other Ambulatory Visit (HOSPITAL_COMMUNITY)
Admit: 2021-12-27 | Discharge: 2021-12-27 | Disposition: A | Discharge status: Home / Self Care | Payer: Medicaid Other | Attending: Obstetrics & Gynecology | Admitting: Obstetrics & Gynecology

## 2021-12-27 DIAGNOSIS — Z3A01 Less than 8 weeks gestation of pregnancy: Secondary | ICD-10-CM | POA: Insufficient documentation

## 2021-12-27 DIAGNOSIS — O3680X0 Pregnancy with inconclusive fetal viability, not applicable or unspecified: Secondary | ICD-10-CM | POA: Diagnosis present

## 2021-12-27 LAB — CULTURE, OB URINE: Culture: 40000 — AB

## 2021-12-27 LAB — HCG, QUANTITATIVE, PREGNANCY: hCG, Beta Chain, Quant, S: 53 m[IU]/mL — ABNORMAL HIGH (ref <5)

## 2021-12-27 NOTE — MAU Provider Note (Signed)
History   Chief Complaint:  Follow-up   Victoria Garrett is a 21 y.o. G3P2002 at [redacted]w[redacted]d who presents for labs.  Reports continued vaginal bleeding. Not saturating pads or passing blood clots. Denies abdominal pain.   Physical Exam   Blood pressure 130/81, pulse 77, temperature 98.6 F (37 C), temperature source Oral, resp. rate 16, height 5\' 2"  (1.575 m), weight 75.3 kg, last menstrual period 11/16/2021, SpO2 100 %, not currently breastfeeding.  Physical Examination: General appearance - alert, well appearing, and in no distress Mental status - normal mood, behavior, speech, dress, motor activity, and thought processes Eyes - sclera anicteric Chest - normal respiratory effort  Labs: Results for orders placed or performed during the hospital encounter of 12/27/21 (from the past 24 hour(s))  hCG, quantitative, pregnancy   Collection Time: 12/27/21  6:14 PM  Result Value Ref Range   hCG, Beta Chain, Quant, S 53 (H) <5 mIU/mL    Ultrasound Studies:   No results found.  Assessment:   1. Pregnancy of unknown anatomic location   2. [redacted] weeks gestation of pregnancy     -Appropriate rise in HCG, 24<53. RH positive. Will get another HCG in the office on Monday. May consider viability scan in 2 weeks if appropriate rise.    Plan: -Discharge home in stable condition -SAB vs ectopic precautions discussed -Patient advised to follow-up with [facility] on Monday for lab visit -Patient may return to MAU as needed or if her condition were to change or worsen  Saturday, NP 12/27/2021, 9:48 PM

## 2021-12-27 NOTE — MAU Note (Signed)
..  Victoria Garrett is a 21 y.o. at [redacted]w[redacted]d here in MAU reporting: here for repeat HCG level.  Doing ok.  Still bleeding, "pretty heavy yesterday, changed pad once. Mild cramps. Dizzy at times  Pain score: mild Vitals:   12/27/21 1820  BP: 130/81  Pulse: 77  Resp: 16  Temp: 98.6 F (37 C)  SpO2: 100%      Lab orders placed from triage:

## 2021-12-27 NOTE — Discharge Instructions (Signed)
Return to care  If you have heavier bleeding that soaks through more than 2 pads per hour for an hour or more If you bleed so much that you feel like you might pass out or you do pass out If you have significant abdominal pain that is not improved with Tylenol   

## 2021-12-30 ENCOUNTER — Telehealth: Payer: Self-pay

## 2021-12-30 ENCOUNTER — Ambulatory Visit (INDEPENDENT_AMBULATORY_CARE_PROVIDER_SITE_OTHER): Payer: Medicaid Other | Admitting: General Practice

## 2021-12-30 VITALS — BP 124/80 | HR 71 | Ht 62.0 in | Wt 169.4 lb

## 2021-12-30 DIAGNOSIS — O3680X0 Pregnancy with inconclusive fetal viability, not applicable or unspecified: Secondary | ICD-10-CM

## 2021-12-30 LAB — BETA HCG QUANT (REF LAB): hCG Quant: 127 m[IU]/mL

## 2021-12-30 NOTE — Progress Notes (Signed)
Pt presents for stat HCG. Pt seen at MAU on 11/8 for vaginal bleeding. HCG repeated at MAU 11/10.  Pt noticed "old brown blood" yesterday. Pt reports bleeding stopped this morning and reports mild cramping today 1/10.

## 2021-12-30 NOTE — Telephone Encounter (Signed)
-----   Message from Catalina Antigua, MD sent at 12/30/2021 12:58 PM EST ----- Please inform patient of continued rise in quant HCG level. Will plan for ultrasound next week. Please provide ectopic precautions

## 2021-12-30 NOTE — Telephone Encounter (Signed)
-----   Message from Peggy Constant, MD sent at 12/30/2021 12:58 PM EST ----- Please inform patient of continued rise in quant HCG level. Will plan for ultrasound next week. Please provide ectopic precautions  

## 2021-12-30 NOTE — Addendum Note (Signed)
Addended by: Catalina Antigua on: 12/30/2021 12:58 PM   Modules accepted: Orders

## 2021-12-30 NOTE — Telephone Encounter (Signed)
Spoke with Pt regarding HCG levels rising. Pt has [place] scheduled for 01/13/2022. ectopic precautions given. Pt verbalized understanding and agreement.

## 2022-01-06 ENCOUNTER — Ambulatory Visit (INDEPENDENT_AMBULATORY_CARE_PROVIDER_SITE_OTHER): Payer: Medicaid Other

## 2022-01-06 ENCOUNTER — Ambulatory Visit
Admission: RE | Admit: 2022-01-06 | Discharge: 2022-01-06 | Disposition: A | Discharge status: Home / Self Care | Payer: Medicaid Other | Source: Ambulatory Visit | Attending: Obstetrics and Gynecology | Admitting: Obstetrics and Gynecology

## 2022-01-06 ENCOUNTER — Other Ambulatory Visit: Payer: Self-pay | Admitting: *Deleted

## 2022-01-06 DIAGNOSIS — O3680X0 Pregnancy with inconclusive fetal viability, not applicable or unspecified: Secondary | ICD-10-CM | POA: Diagnosis present

## 2022-01-06 NOTE — Progress Notes (Signed)
Ultrasound Results Visit   Pt here today following viability [place] for results. Results reviewed with M. Ervin MD who finds Early single pregnancy of unknown viability and reccomends repeat US follow up in 10-14 days.  Reviewed dating and provider recommendation with patient:   EDD:  5w 2d today  Reviewed medications and allergies with patient. Recommended pt begin prenatal vitamin and schedule follow up US in 10-14 days.

## 2022-01-06 NOTE — Progress Notes (Signed)
Follow up u/s ordered for US ob less than 14 with transvag.

## 2022-01-08 ENCOUNTER — Encounter: Payer: Self-pay | Admitting: Obstetrics

## 2022-01-20 ENCOUNTER — Ambulatory Visit
Admission: RE | Admit: 2022-01-20 | Discharge: 2022-01-20 | Disposition: A | Discharge status: Home / Self Care | Payer: Medicaid Other | Source: Ambulatory Visit | Attending: Obstetrics and Gynecology | Admitting: Obstetrics and Gynecology

## 2022-01-20 ENCOUNTER — Ambulatory Visit (INDEPENDENT_AMBULATORY_CARE_PROVIDER_SITE_OTHER): Payer: Medicaid Other

## 2022-01-20 DIAGNOSIS — O3680X0 Pregnancy with inconclusive fetal viability, not applicable or unspecified: Secondary | ICD-10-CM | POA: Insufficient documentation

## 2022-01-20 DIAGNOSIS — Z3A01 Less than 8 weeks gestation of pregnancy: Secondary | ICD-10-CM

## 2022-01-20 NOTE — Progress Notes (Signed)
Ultrasound Results Visit   Pt here today following viability [place] for results. Results reviewed with Ralene Bathe MD  who finds small to moderate subchorionic hemorrhage and reccomends follow up with prenatal care. Pt advised to seek medical attention if she experiences abdominal cramping or vaginal bleeding.   Reviewed dating and provider recommendation with patient:   EDD:  7w 1d today  Reviewed medications and allergies with patient. Recommended pt begin prenatal vitamin and schedule prenatal care.

## 2022-01-30 ENCOUNTER — Other Ambulatory Visit (HOSPITAL_COMMUNITY)
Admission: RE | Admit: 2022-01-30 | Discharge: 2022-01-30 | Disposition: A | Discharge status: Home / Self Care | Payer: Medicaid Other | Source: Ambulatory Visit | Attending: Obstetrics | Admitting: Obstetrics

## 2022-01-30 ENCOUNTER — Encounter: Payer: Self-pay | Admitting: Obstetrics

## 2022-01-30 ENCOUNTER — Ambulatory Visit (INDEPENDENT_AMBULATORY_CARE_PROVIDER_SITE_OTHER): Payer: Medicaid Other | Admitting: Obstetrics

## 2022-01-30 VITALS — BP 125/78 | HR 77 | Wt 175.6 lb

## 2022-01-30 DIAGNOSIS — Z3A10 10 weeks gestation of pregnancy: Secondary | ICD-10-CM | POA: Insufficient documentation

## 2022-01-30 DIAGNOSIS — F329 Major depressive disorder, single episode, unspecified: Secondary | ICD-10-CM

## 2022-01-30 DIAGNOSIS — O468X1 Other antepartum hemorrhage, first trimester: Secondary | ICD-10-CM

## 2022-01-30 DIAGNOSIS — O99340 Other mental disorders complicating pregnancy, unspecified trimester: Secondary | ICD-10-CM | POA: Diagnosis not present

## 2022-01-30 DIAGNOSIS — Z3481 Encounter for supervision of other normal pregnancy, first trimester: Secondary | ICD-10-CM | POA: Diagnosis not present

## 2022-01-30 DIAGNOSIS — O26891 Other specified pregnancy related conditions, first trimester: Secondary | ICD-10-CM | POA: Insufficient documentation

## 2022-01-30 DIAGNOSIS — O418X10 Other specified disorders of amniotic fluid and membranes, first trimester, not applicable or unspecified: Secondary | ICD-10-CM

## 2022-01-30 DIAGNOSIS — Z1332 Encounter for screening for maternal depression: Secondary | ICD-10-CM | POA: Diagnosis not present

## 2022-01-30 DIAGNOSIS — O99210 Obesity complicating pregnancy, unspecified trimester: Secondary | ICD-10-CM

## 2022-01-30 DIAGNOSIS — E661 Drug-induced obesity: Secondary | ICD-10-CM

## 2022-01-30 NOTE — Progress Notes (Addendum)
Patient presents for new OB visit. Pt reports brown spotting from subchorionic hematoma. Pt also reports being more fatigued and being light headed at times. No other concerns at this time.  PHQ-10 GAD-16 I asked pt if she would like a referral to [facility] to help with her anxiety and depression during pregnancy, and she informed me she has her own ways of coping. She assured me that she would reach out to [place] if her symptoms got worse or if she needed help coping. Pt seems confident managing her symptoms on her own.

## 2022-01-30 NOTE — Progress Notes (Signed)
Subjective:    Victoria Garrett is being seen today for her first obstetrical visit.  This is not a planned pregnancy. She is at [redacted]w[redacted]d gestation. Her obstetrical history is significant for  none . Relationship with FOB: significant other, not living together. Patient does intend to breast feed. Pregnancy history fully reviewed.  The information documented in the HPI was reviewed and verified.  Menstrual History: OB History     Gravida  3   Para  2   Term  2   Preterm      AB      Living  2      SAB      IAB      Ectopic      Multiple  0   Live Births  2            Patient's last menstrual period was 11/16/2021 (exact date).    Past Medical History:  Diagnosis Date   Anemia    Benign gestational thrombocytopenia in third trimester (HCC) 02/28/2020   Plts 135 at new OB, then 127 at 28 weeks Check platelets at 36 week visit [ ]     Heart murmur    as a child    Preterm uterine contractions in third trimester, antepartum 02/28/2020   Supervision of other normal pregnancy, antepartum 02/13/2020    Nursing Staff Provider Office Location [facility]  Dating   6w 5d 02/15/2020 Language  ENGLISH Anatomy US   WNL visible in Care everywhere 10/2019 Flu Vaccine  02/14/2020 Genetic Screen  NIPS:   AFP:   First Screen:  Quad:   TDaP Vaccine   02/14/2020 Hgb A1C or  GTT Normal 1 hour 01/04/2020 per [facility] COVID Vaccine NOT VACCINATED   LAB RESULTS  Rhogam   Blood Type    B POS Feeding Plan BREASTFEED A   UTI in pregnancy 03/02/2020   Vs bacteriuria. Treated 1/14. [ ]  TOC    Past Surgical History:  Procedure Laterality Date   IR NEPHROURETERAL CATH PLACE RIGHT Right 10/2015   NEPHROSTOMY TUBE PLACEMENT ([facility] HX)      (Not in a hospital admission)  No Known Allergies  Social History   Tobacco Use   Smoking status: Never   Smokeless tobacco: Never  Substance Use Topics   Alcohol use: Not Currently    Family History  Problem Relation Age of Onset   Healthy Mother     Healthy Father    Asthma Sister    Diabetes Maternal Grandmother      Review of Systems Constitutional: negative for weight loss Gastrointestinal: negative for vomiting Genitourinary:negative for genital lesions and vaginal discharge and dysuria Musculoskeletal:negative for back pain Behavioral/Psych: negative for abusive relationship, depression, illegal drug usage and tobacco use    Objective:    BP 125/78   Pulse 77   Wt 175 lb 9.6 oz (79.7 kg)   LMP 11/16/2021 (Exact Date)   BMI 32.12 kg/m  General Appearance:    Alert, cooperative, no distress, appears stated age  Head:    Normocephalic, without obvious abnormality, atraumatic  Eyes:    PERRL, conjunctiva/corneas clear, EOM's intact, fundi    benign, both eyes  Ears:    Normal TM's and external ear canals, both ears  Nose:   Nares normal, septum midline, mucosa normal, no drainage    or sinus tenderness  Throat:   Lips, mucosa, and tongue normal; teeth and gums normal  Neck:   Supple, symmetrical, trachea midline, no  adenopathy;    thyroid:  no enlargement/tenderness/nodules; no carotid   bruit or JVD  Back:     Symmetric, no curvature, ROM normal, no CVA tenderness  Lungs:     Clear to auscultation bilaterally, respirations unlabored  Chest Wall:    No tenderness or deformity   Heart:    Regular rate and rhythm, S1 and S2 normal, no murmur, rub   or gallop  Breast Exam:    No tenderness, masses, or nipple abnormality  Abdomen:     Soft, non-tender, bowel sounds active all four quadrants,    no masses, no organomegaly  Genitalia:    Normal female without lesion, discharge or tenderness  Extremities:   Extremities normal, atraumatic, no cyanosis or edema  Pulses:   2+ and symmetric all extremities  Skin:   Skin color, texture, turgor normal, no rashes or lesions  Lymph nodes:   Cervical, supraclavicular, and axillary nodes normal  Neurologic:   CNII-XII intact, normal strength, sensation and reflexes    throughout       Lab Review Urine pregnancy test Labs reviewed yes Radiologic studies reviewed yes  Assessment:    Pregnancy at [redacted]w[redacted]d weeks    Plan:      Prenatal vitamins.  Counseling provided regarding continued use of seat belts, cessation of alcohol consumption, smoking or use of illicit drugs; infection precautions i.e., influenza/TDAP immunizations, toxoplasmosis,CMV, parvovirus, listeria and varicella; workplace safety, exercise during pregnancy; routine dental care, safe medications, sexual activity, hot tubs, saunas, pools, travel, caffeine use, fish and methlymercury, potential toxins, hair treatments, varicose veins Weight gain recommendations per IOM guidelines reviewed: underweight/BMI< 18.5--> gain 28 - 40 lbs; normal weight/BMI 18.5 - 24.9--> gain 25 - 35 lbs; overweight/BMI 25 - 29.9--> gain 15 - 25 lbs; obese/BMI >30->gain  11 - 20 lbs Problem list reviewed and updated. FIRST/CF mutation testing/NIPT/QUAD SCREEN/fragile X/Ashkenazi Jewish population testing/Spinal muscular atrophy discussed: requested. Role of ultrasound in pregnancy discussed; fetal survey: requested. Amniocentesis discussed: not indicated. VBAC calculator score: VBAC consent form provided No orders of the defined types were placed in this encounter.  Orders Placed This Encounter  Procedures   Culture, OB Urine   Ambulatory referral to Integrated Behavioral Health    Referral Priority:   Routine    Referral Type:   Consultation    Referral Reason:   Specialty Services Required    Number of Visits Requested:   1    Follow up in 4 weeks.  I have spent a total of 20 minutes of face-to-face time, excluding clinical staff time, reviewing notes and preparing to see patient, ordering tests and/or medications, and counseling the patient.   Pryce Folts A. Clearance Coots MD 01/30/22

## 2022-01-31 LAB — CERVICOVAGINAL ANCILLARY ONLY
Chlamydia: NEGATIVE
Comment: NEGATIVE
Comment: NEGATIVE
Comment: NORMAL
Neisseria Gonorrhea: NEGATIVE
Trichomonas: NEGATIVE

## 2022-02-17 NOTE — L&D Delivery Note (Signed)
   Delivery Note:   [id number] at [redacted]w[redacted]d  Admitting diagnosis: Indication for care in labor or delivery [O75.9] Risks:  Patient Active Problem List   Diagnosis Date Noted   Supervision of high risk pregnancy, antepartum 06/09/2022   Obesity affecting pregnancy 06/09/2022   Marijuana use during pregnancy 06/09/2022   Marginal insertion of umbilical cord affecting management of mother in second trimester 06/09/2022   Suicide attempt by drug overdose (HCC) 04/18/2022   Severe recurrent major depression without psychotic features (HCC) 03/03/2022   Moderate cannabis use disorder (HCC) 02/23/2022   Thrombocytopenia (HCC) 02/23/2022   Major depressive disorder affecting pregnancy 02/14/2020   Nausea/vomiting in pregnancy 08/23/2019   PTSD (post-traumatic stress disorder) 03/03/2018     First Stage:  Induction of labor:08/31/2022 Onset of labor: 08/31/2022 @ 1700 Augmentation: Pitocin ROM: SROM  Active labor onset: 09/02/2022 @ 1640 Analgesia /Anesthesia/Pain control intrapartum: Epidural  Second Stage:  Complete dilation at 09/01/2022 @ 0332 Onset of pushing at 0335 FHR second stage CAt II. Moderate variability, with early and variable decels with quick return to baseline.    CNM called to patient bedside patient Pushing in lithotomy position with CNM and L&D staff support at bedside. FOB "jeremy" and Doula "Lauren"  present for birth and supportive.   Delivery of a Live born female  Birth Weight:  PENDING  APGAR: 9, 9  Newborn Delivery   Birth date/time: 09/01/2022 04:07:00 Delivery type: Vaginal, Spontaneous     With great maternal pushing efforts fetal head delivered in cephalic presentation, position DOA and spontaneously restituted to LOT position. On the next contractions with maternal pushing efforts, remaining fetal body delivered with ease and vigorously crying infant placed immediately skin to skin.  Nuchal Cord: No    After 7 mins of life cord double clamped after  cessation of pulsation, and cut by FOB "jeremy".  Collection of cord blood for typing completed. Cord blood donation-None Arterial cord blood sample-No   Third Stage:  With gentle cord traction Placenta delivered-Spontaneous intact with 3 vessels. Uterine tone firm bleeding minimal.  Uterotonics: IV pit bolus initiated  Placenta to L&D for disposal 3.  None laceration identified.  Episiotomy:None Local analgesia: N/A   Repair: Multiple hemostatic perineal abrasions observed, otherwise intact perineum.  Est. Blood Loss (mL):107.00  Complications: None   Mom to postpartum.  Baby boy "Asriel" to Couplet care / Skin to Skin.  Delivery Report:  Review the Delivery Report for details.    Haniya Fern Danella Deis) Suzie Portela, MSN, CNM  [facility]  09/01/22  4:28 AM

## 2022-02-22 DIAGNOSIS — F332 Major depressive disorder, recurrent severe without psychotic features: Secondary | ICD-10-CM | POA: Insufficient documentation

## 2022-02-23 DIAGNOSIS — F1220 Cannabis dependence, uncomplicated: Secondary | ICD-10-CM | POA: Insufficient documentation

## 2022-02-23 DIAGNOSIS — D696 Thrombocytopenia, unspecified: Secondary | ICD-10-CM | POA: Insufficient documentation

## 2022-02-24 ENCOUNTER — Encounter: Payer: Medicaid Other | Admitting: Student

## 2022-02-24 ENCOUNTER — Institutional Professional Consult (permissible substitution): Payer: Medicaid Other | Admitting: Licensed Clinical Social Worker

## 2022-02-24 DIAGNOSIS — Z3A12 12 weeks gestation of pregnancy: Secondary | ICD-10-CM

## 2022-02-24 DIAGNOSIS — Z3482 Encounter for supervision of other normal pregnancy, second trimester: Secondary | ICD-10-CM

## 2022-02-24 DIAGNOSIS — F329 Major depressive disorder, single episode, unspecified: Secondary | ICD-10-CM

## 2022-02-27 ENCOUNTER — Telehealth: Payer: Self-pay

## 2022-02-27 NOTE — Telephone Encounter (Signed)
Called to reschedule appt that was missed for ROB on 1/8 unable to leave a message

## 2022-03-01 ENCOUNTER — Other Ambulatory Visit: Payer: Self-pay

## 2022-03-01 ENCOUNTER — Emergency Department (HOSPITAL_COMMUNITY)
Admission: EM | Admit: 2022-03-01 | Discharge: 2022-03-03 | Disposition: A | Discharge status: Other Acute Inpatient Hospital | Payer: Medicaid Other | Attending: Emergency Medicine | Admitting: Emergency Medicine

## 2022-03-01 ENCOUNTER — Encounter (HOSPITAL_COMMUNITY): Payer: Self-pay | Admitting: Emergency Medicine

## 2022-03-01 DIAGNOSIS — F339 Major depressive disorder, recurrent, unspecified: Secondary | ICD-10-CM | POA: Diagnosis not present

## 2022-03-01 DIAGNOSIS — Z20822 Contact with and (suspected) exposure to covid-19: Secondary | ICD-10-CM | POA: Diagnosis not present

## 2022-03-01 DIAGNOSIS — O99341 Other mental disorders complicating pregnancy, first trimester: Secondary | ICD-10-CM | POA: Insufficient documentation

## 2022-03-01 DIAGNOSIS — O26891 Other specified pregnancy related conditions, first trimester: Secondary | ICD-10-CM | POA: Diagnosis present

## 2022-03-01 DIAGNOSIS — O99111 Other diseases of the blood and blood-forming organs and certain disorders involving the immune mechanism complicating pregnancy, first trimester: Secondary | ICD-10-CM | POA: Diagnosis not present

## 2022-03-01 DIAGNOSIS — O9A211 Injury, poisoning and certain other consequences of external causes complicating pregnancy, first trimester: Secondary | ICD-10-CM | POA: Insufficient documentation

## 2022-03-01 DIAGNOSIS — T50902A Poisoning by unspecified drugs, medicaments and biological substances, intentional self-harm, initial encounter: Secondary | ICD-10-CM

## 2022-03-01 DIAGNOSIS — T43022A Poisoning by tetracyclic antidepressants, intentional self-harm, initial encounter: Secondary | ICD-10-CM | POA: Diagnosis not present

## 2022-03-01 DIAGNOSIS — D696 Thrombocytopenia, unspecified: Secondary | ICD-10-CM | POA: Insufficient documentation

## 2022-03-01 DIAGNOSIS — Z3A01 Less than 8 weeks gestation of pregnancy: Secondary | ICD-10-CM | POA: Diagnosis not present

## 2022-03-01 DIAGNOSIS — T1491XA Suicide attempt, initial encounter: Secondary | ICD-10-CM

## 2022-03-01 LAB — COMPREHENSIVE METABOLIC PANEL
ALT: 15 U/L (ref 0–44)
AST: 13 U/L — ABNORMAL LOW (ref 15–41)
Albumin: 4.3 g/dL (ref 3.5–5.0)
Alkaline Phosphatase: 34 U/L — ABNORMAL LOW (ref 38–126)
Anion gap: 12 (ref 5–15)
BUN: <5 mg/dL — ABNORMAL LOW (ref 6–20)
CO2: 21 mmol/L — ABNORMAL LOW (ref 22–32)
Calcium: 9.3 mg/dL (ref 8.9–10.3)
Chloride: 102 mmol/L (ref 98–111)
Creatinine, Ser: 0.45 mg/dL (ref 0.44–1.00)
GFR, Estimated: >60 mL/min (ref >60)
Glucose, Bld: 84 mg/dL (ref 70–99)
Potassium: 3.5 mmol/L (ref 3.5–5.1)
Sodium: 135 mmol/L (ref 135–145)
Total Bilirubin: 0.3 mg/dL (ref 0.3–1.2)
Total Protein: 7.7 g/dL (ref 6.5–8.1)

## 2022-03-01 LAB — CBC WITH DIFFERENTIAL/PLATELET
Abs Immature Granulocytes: 0.04 10*3/uL (ref 0.00–0.07)
Basophils Absolute: 0 10*3/uL (ref 0.0–0.1)
Basophils Relative: 0 %
Eosinophils Absolute: 0 10*3/uL (ref 0.0–0.5)
Eosinophils Relative: 0 %
HCT: 40.3 % (ref 36.0–46.0)
Hemoglobin: 13.8 g/dL (ref 12.0–15.0)
Immature Granulocytes: 0 %
Lymphocytes Relative: 23 %
Lymphs Abs: 2.2 10*3/uL (ref 0.7–4.0)
MCH: 29.3 pg (ref 26.0–34.0)
MCHC: 34.2 g/dL (ref 30.0–36.0)
MCV: 85.6 fL (ref 80.0–100.0)
Monocytes Absolute: 0.4 10*3/uL (ref 0.1–1.0)
Monocytes Relative: 5 %
Neutro Abs: 6.8 10*3/uL (ref 1.7–7.7)
Neutrophils Relative %: 72 %
Platelets: 116 10*3/uL — ABNORMAL LOW (ref 150–400)
RBC: 4.71 MIL/uL (ref 3.87–5.11)
RDW: 12.6 % (ref 11.5–15.5)
WBC: 9.5 10*3/uL (ref 4.0–10.5)
nRBC: 0 % (ref 0.0–0.2)

## 2022-03-01 LAB — ETHANOL: Alcohol, Ethyl (B): <10 mg/dL (ref <10)

## 2022-03-01 LAB — CBG MONITORING, ED
Glucose-Capillary: 69 mg/dL — ABNORMAL LOW (ref 70–99)
Glucose-Capillary: 86 mg/dL (ref 70–99)

## 2022-03-01 LAB — PREGNANCY, URINE: Preg Test, Ur: POSITIVE — AB

## 2022-03-01 LAB — MAGNESIUM: Magnesium: 2 mg/dL (ref 1.7–2.4)

## 2022-03-01 LAB — HCG, QUANTITATIVE, PREGNANCY: hCG, Beta Chain, Quant, S: 92133 m[IU]/mL — ABNORMAL HIGH (ref <5)

## 2022-03-01 LAB — SALICYLATE LEVEL: Salicylate Lvl: <7 mg/dL — ABNORMAL LOW (ref 7.0–30.0)

## 2022-03-01 MED ORDER — SODIUM CHLORIDE 0.9 % IV BOLUS
1000.0000 mL | Freq: Once | INTRAVENOUS | Status: AC
  Administered 2022-03-01: 1000 mL via INTRAVENOUS

## 2022-03-01 NOTE — ED Notes (Signed)
Visitor at bedside.

## 2022-03-01 NOTE — ED Provider Notes (Signed)
[facility] DEPT Provider Note   CSN: [id number] Arrival date & time: 03/01/22  1658     History {Add pertinent medical, surgical, social history, OB history to HPI:1} Chief Complaint  Patient presents with   Drug Overdose    Victoria Garrett is a 22 y.o. female with past medical history significant for anxiety, depression, previous suicide attempt, PTSD who is currently 3 months pregnant who presents with concern for intentional overdose, attempted suicide, patient reports that she took 25 15 mg mirtazapine.  She was witnessed taking them by her husband.  She was recently discharged from inpatient psychiatric facility.  On my arrival she reports some mild nausea but otherwise no acute distress or other symptoms.   Drug Overdose       Home Medications Prior to Admission medications   Medication Sig Start Date End Date Taking? Authorizing Provider  metroNIDAZOLE (METROGEL) 0.75 % vaginal gel Place 1 Applicatorful vaginally at bedtime. Apply one applicatorful to vagina at bedtime for 5 days Patient not taking: Reported on 01/20/2022 12/25/21   Gavin Pound, CNM  Prenatal Vit-Fe Phos-FA-Omega (VITAFOL GUMMIES) 3.33-0.333-34.8 MG CHEW Chew 1 tablet by mouth daily. 12/23/21   Constant, Peggy, MD  promethazine (PHENERGAN) 25 MG tablet Take 1 tablet (25 mg total) by mouth every 6 (six) hours as needed for nausea or vomiting. 12/23/21   Constant, Peggy, MD      Allergies    Patient has no known allergies.    Review of Systems   Review of Systems  Psychiatric/Behavioral:  Positive for self-injury and suicidal ideas.   All other systems reviewed and are negative.   Physical Exam Updated Vital Signs BP 128/85 (BP Location: Left Arm)   Pulse 96   Temp 98 F (36.7 C) (Oral)   Resp 16   LMP 11/16/2021 (Exact Date)   SpO2 100%  Physical Exam Vitals and nursing note reviewed.  Constitutional:      General: She is not in acute distress.     Appearance: Normal appearance.  HENT:     Head: Normocephalic and atraumatic.  Eyes:     General:        Right eye: No discharge.        Left eye: No discharge.  Cardiovascular:     Rate and Rhythm: Normal rate and regular rhythm.     Heart sounds: No murmur heard.    No friction rub. No gallop.  Pulmonary:     Effort: Pulmonary effort is normal.     Breath sounds: Normal breath sounds.  Abdominal:     General: Bowel sounds are normal.     Palpations: Abdomen is soft.     Comments: Uterine fundus palpable above the umbilicus, no significant tenderness to palpation, no fetal movement palpated  Skin:    General: Skin is warm and dry.     Capillary Refill: Capillary refill takes less than 2 seconds.  Neurological:     Mental Status: She is alert and oriented to person, place, and time.  Psychiatric:        Mood and Affect: Mood normal.        Behavior: Behavior normal.     ED Results / Procedures / Treatments   Labs (all labs ordered are listed, but only abnormal results are displayed) Labs Reviewed  COMPREHENSIVE METABOLIC PANEL  SALICYLATE LEVEL  ACETAMINOPHEN LEVEL  ETHANOL  CBC WITH DIFFERENTIAL/PLATELET  PREGNANCY, URINE  HCG, QUANTITATIVE, PREGNANCY  CBG MONITORING, ED    EKG  None  Radiology No results found.  Procedures Procedures  {Document cardiac monitor, telemetry assessment procedure when appropriate:1}  Medications Ordered in ED Medications  sodium chloride 0.9 % bolus 1,000 mL (has no administration in time range)    ED Course/ Medical Decision Making/ A&P Clinical Course as of 03/01/22 2217  Sat Mar 01, 2022  1729 Nausea, dizziness, overall mild clincal presentation, tachy or brady, somnolence, can be at risk for QT [CP]  1731 No large maternal, fetal outcomes noted [CP]  1732 Monitor for at least 6 hours -- repeat EKG at 6 hours [CP]  2216 Patient status post 6 hours from ingestion, she continues to have stable vital signs, she is medically  cleared at this time for psychiatric evaluation. [CP]    Clinical Course User Index [CP] Nnaemeka Samson H, PA-C   {   Click here for ABCD2, HEART and other calculatorsREFRESH Note before signing :1}                          Medical Decision Making Amount and/or Complexity of Data Reviewed Labs: ordered.   Patient is a 22 y.o. female  who presents to the emergency department for psychiatric complaint.  Past Medical History: ***  Physical Exam: ***  Labs: Medical clearance labs ordered. Results are pending.   Medications: ***  Disposition: Patient is otherwise medically cleared at this time pending medical clearance laboratory evaluation. Will consult TTS and appreciate their recommendations.  I discussed this case with my attending physician Dr. [name] who cosigned this note including patient's presenting symptoms, physical exam, and planned diagnostics and interventions. Attending physician stated agreement with plan or made changes to plan which were implemented.   Final Clinical Impression(s) / ED Diagnoses Final diagnoses:  None    Rx / DC Orders ED Discharge Orders     None

## 2022-03-01 NOTE — ED Triage Notes (Signed)
Patient was picked up by EMS post ingestion of 25 15 mg tablets of mirtazapine 25- 15 mg table. Her husband witnessed her take them. She is currently 3 months pregnant. She was recently discharged from inpatient pysch.    HX Anxiety  depression  EMS vitals: 82 CBG 90 HR 134/80 BP

## 2022-03-01 NOTE — ED Notes (Signed)
Spoke with poison control new orders provided.

## 2022-03-02 LAB — SALICYLATE LEVEL: Salicylate Lvl: <7 mg/dL — ABNORMAL LOW (ref 7.0–30.0)

## 2022-03-02 LAB — RESP PANEL BY RT-PCR (RSV, FLU A&B, COVID)  RVPGX2
Influenza A by PCR: NEGATIVE
Influenza B by PCR: NEGATIVE
Resp Syncytial Virus by PCR: NEGATIVE
SARS Coronavirus 2 by RT PCR: NEGATIVE

## 2022-03-02 LAB — ACETAMINOPHEN LEVEL: Acetaminophen (Tylenol), Serum: <10 ug/mL — ABNORMAL LOW (ref 10–30)

## 2022-03-02 MED ORDER — ONDANSETRON 4 MG PO TBDP
4.0000 mg | ORAL_TABLET | Freq: Once | ORAL | Status: AC
  Administered 2022-03-02: 4 mg via ORAL
  Filled 2022-03-02: qty 1

## 2022-03-02 NOTE — ED Notes (Signed)
Pt eating breakfast 

## 2022-03-02 NOTE — ED Notes (Signed)
Pt c/o nausea.  

## 2022-03-02 NOTE — ED Provider Notes (Signed)
Emergency Medicine Observation Re-evaluation Note  Victoria Garrett is a 22 y.o. female, seen on rounds today.  Pt initially presented to the ED for complaints of Drug Overdose Currently, the patient is awake and watching TV.  Physical Exam  BP 114/73 (BP Location: Left Arm)   Pulse 76   Temp 99.1 F (37.3 C) (Oral)   Resp 16   LMP 11/16/2021 (Exact Date)   SpO2 97%  Physical Exam Vitals and nursing note reviewed.  Constitutional:      General: She is not in acute distress.    Appearance: She is well-developed.  HENT:     Head: Normocephalic and atraumatic.  Eyes:     Conjunctiva/sclera: Conjunctivae normal.  Cardiovascular:     Rate and Rhythm: Normal rate and regular rhythm.     Heart sounds: No murmur heard. Pulmonary:     Effort: Pulmonary effort is normal. No respiratory distress.  Musculoskeletal:        General: No swelling.     Cervical back: Neck supple.  Skin:    General: Skin is warm and dry.     Capillary Refill: Capillary refill takes less than 2 seconds.  Neurological:     Mental Status: She is alert.  Psychiatric:        Mood and Affect: Mood normal.      ED Course / MDM  EKG:EKG Interpretation  Date/Time:  Saturday March 01 2022 18:55:35 EST Ventricular Rate:  76 PR Interval:  127 QRS Duration: 90 QT Interval:  375 QTC Calculation: 422 R Axis:   46 Text Interpretation: Sinus rhythm No acute changes No significant change since last tracing Confirmed by Varney Biles [phone]) on 03/01/2022 9:54:19 PM  I have reviewed the labs performed to date as well as medications administered while in observation.  Recent changes in the last 24 hours include patient with nausea this morning and Zofran given.  Plan  Current plan is for TTS evaluation.    Teressa Lower, MD 03/02/22 [phone]

## 2022-03-02 NOTE — ED Notes (Signed)
Poison control has cleared pt.

## 2022-03-02 NOTE — ED Notes (Signed)
Pt's partner visited for 30 min.

## 2022-03-03 ENCOUNTER — Other Ambulatory Visit: Payer: Self-pay

## 2022-03-03 ENCOUNTER — Inpatient Hospital Stay
Admission: AD | Admit: 2022-03-03 | Discharge: 2022-03-10 | DRG: 832 | Disposition: A | Discharge status: Home / Self Care | Payer: Medicaid Other | Source: Intra-hospital | Attending: Psychiatry | Admitting: Psychiatry

## 2022-03-03 ENCOUNTER — Encounter: Payer: Self-pay | Admitting: Psychiatry

## 2022-03-03 DIAGNOSIS — O99341 Other mental disorders complicating pregnancy, first trimester: Principal | ICD-10-CM | POA: Diagnosis present

## 2022-03-03 DIAGNOSIS — Z9152 Personal history of nonsuicidal self-harm: Secondary | ICD-10-CM

## 2022-03-03 DIAGNOSIS — Z3480 Encounter for supervision of other normal pregnancy, unspecified trimester: Secondary | ICD-10-CM

## 2022-03-03 DIAGNOSIS — T43022A Poisoning by tetracyclic antidepressants, intentional self-harm, initial encounter: Secondary | ICD-10-CM | POA: Diagnosis present

## 2022-03-03 DIAGNOSIS — Z9151 Personal history of suicidal behavior: Secondary | ICD-10-CM | POA: Diagnosis not present

## 2022-03-03 DIAGNOSIS — O99351 Diseases of the nervous system complicating pregnancy, first trimester: Secondary | ICD-10-CM | POA: Diagnosis present

## 2022-03-03 DIAGNOSIS — Z3490 Encounter for supervision of normal pregnancy, unspecified, unspecified trimester: Secondary | ICD-10-CM

## 2022-03-03 DIAGNOSIS — O210 Mild hyperemesis gravidarum: Secondary | ICD-10-CM | POA: Diagnosis present

## 2022-03-03 DIAGNOSIS — Z3A13 13 weeks gestation of pregnancy: Secondary | ICD-10-CM

## 2022-03-03 DIAGNOSIS — F332 Major depressive disorder, recurrent severe without psychotic features: Secondary | ICD-10-CM | POA: Diagnosis present

## 2022-03-03 DIAGNOSIS — G4700 Insomnia, unspecified: Secondary | ICD-10-CM | POA: Diagnosis present

## 2022-03-03 DIAGNOSIS — Z20822 Contact with and (suspected) exposure to covid-19: Secondary | ICD-10-CM | POA: Diagnosis present

## 2022-03-03 DIAGNOSIS — O9A211 Injury, poisoning and certain other consequences of external causes complicating pregnancy, first trimester: Secondary | ICD-10-CM | POA: Diagnosis not present

## 2022-03-03 MED ORDER — PRENATAL MULTIVITAMIN CH
1.0000 | ORAL_TABLET | Freq: Every day | ORAL | Status: DC
  Filled 2022-03-03 (×6): qty 1

## 2022-03-03 MED ORDER — ACETAMINOPHEN 325 MG PO TABS
650.0000 mg | ORAL_TABLET | Freq: Four times a day (QID) | ORAL | Status: DC | PRN
  Administered 2022-03-04: 650 mg via ORAL
  Filled 2022-03-03: qty 2

## 2022-03-03 MED ORDER — ONDANSETRON 4 MG PO TBDP
4.0000 mg | ORAL_TABLET | Freq: Once | ORAL | Status: AC
  Administered 2022-03-03: 4 mg via ORAL
  Filled 2022-03-03: qty 1

## 2022-03-03 MED ORDER — ALUM & MAG HYDROXIDE-SIMETH 200-200-20 MG/5ML PO SUSP: 30.0000 mL | ORAL | Status: DC | PRN

## 2022-03-03 MED ORDER — ONDANSETRON 4 MG PO TBDP
4.0000 mg | ORAL_TABLET | Freq: Four times a day (QID) | ORAL | Status: DC | PRN
  Administered 2022-03-03 – 2022-03-09 (×10): 4 mg via ORAL
  Filled 2022-03-03 (×10): qty 1

## 2022-03-03 MED ORDER — MAGNESIUM HYDROXIDE 400 MG/5ML PO SUSP: 30.0000 mL | Freq: Every day | ORAL | Status: DC | PRN

## 2022-03-03 NOTE — ED Provider Notes (Signed)
Emergency Medicine Observation Re-evaluation Note  Arriona Prest is a 22 y.o. female, seen on rounds today.  Pt initially presented to the ED for complaints of Drug Overdose Currently, the patient is patient going to [facility].  Physical Exam  BP (!) 103/58 (BP Location: Left Arm)   Pulse 87   Temp 98.7 F (37.1 C) (Oral)   Resp 16   LMP 11/16/2021 (Exact Date)   SpO2 98%  Physical Exam   ED Course / MDM  EKG:EKG Interpretation  Date/Time:  Sunday March 02 2022 01:06:00 EST Ventricular Rate:  87 PR Interval:  154 QRS Duration: 81 QT Interval:  351 QTC Calculation: 423 R Axis:   29 Text Interpretation: Sinus rhythm Baseline wander in lead(s) V5 V6 Partial missing lead(s): V5 Confirmed by Gerlene Fee [phone]) on 03/03/2022 11:54:17 AM  I have reviewed the labs performed to date as well as medications administered while in observation.  Recent changes in the last 24 hours include .  Plan  Current plan is for transfer.    Lacretia Leigh, MD 03/03/22 [phone]

## 2022-03-03 NOTE — ED Provider Notes (Signed)
Emergency Medicine Observation Re-evaluation Note  Victoria Garrett is a 22 y.o. female, seen on rounds today.  Pt initially presented to the ED for complaints of Drug Overdose Currently, the patient is awaiting psychiatric admission  Physical Exam  BP (!) 103/58 (BP Location: Left Arm)   Pulse 87   Temp 98.7 F (37.1 C) (Oral)   Resp 16   LMP 11/16/2021 (Exact Date)   SpO2 98%  Physical Exam Alert no acute distress ED Course / MDM  EKG:EKG Interpretation  Date/Time:  Saturday March 01 2022 18:55:35 EST Ventricular Rate:  76 PR Interval:  127 QRS Duration: 90 QT Interval:  375 QTC Calculation: 422 R Axis:   46 Text Interpretation: Sinus rhythm No acute changes No significant change since last tracing Confirmed by Varney Biles [phone]) on 03/01/2022 9:54:19 PM  I have reviewed the labs performed to date as well as medications administered while in observation.  Recent changes in the last 24 hours include none.  Plan  Current plan is for psychiatric admission   Milton Ferguson, MD 03/03/22 [phone]

## 2022-03-03 NOTE — Consult Note (Signed)
Accepted at [facility] for treatment of depression and suicide attempt.  Patient awake , alert and oriented x  5.  She regrets taking the amount of Remeron she took.  Patient will be transported soon.  She denies SI/HI/AVH, has hx of previous four suicide attempts.
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
## 2022-03-03 NOTE — ED Notes (Signed)
Patient off unit to the Behavioral Medicine Unit per provider. Patient alert, calm, cooperative, no s/s of distress. Discharge information and belongings given to [facility] for transport. Report given to RN  BMU and accepting is Dr. Weber Cooks. Patient ambulatory off unit, escorted and transported by [facility].

## 2022-03-03 NOTE — Tx Team (Signed)
Initial Treatment Plan 03/03/2022 5:54 PM Victoria Garrett MYT:[id number]    PATIENT STRESSORS: Health problems   Medication change or noncompliance     PATIENT STRENGTHS: Capable of independent living  Communication skills  Supportive family/friends    PATIENT IDENTIFIED PROBLEMS: Overwhelmed with pregnancy  PTSD                   DISCHARGE CRITERIA:  Adequate post-discharge living arrangements Medical problems require only outpatient monitoring Verbal commitment to aftercare and medication compliance  PRELIMINARY DISCHARGE PLAN: Attend aftercare/continuing care group Return to previous living arrangement  PATIENT/FAMILY INVOLVEMENT: This treatment plan has been presented to and reviewed with the patient, Victoria Garrett, and/or family member,   The patient and family have been given the opportunity to ask questions and make suggestions.  Merlene Morse, RN 03/03/2022, 5:54 PM

## 2022-03-03 NOTE — Progress Notes (Signed)
22 years old female patient admitted with major depression after a drug overdose. Patient calm and cooperative on approach. Patient sates that she is overwhelmed with her pregnancy. This is her third pregnancy. She lives with her 2 kids and boyfriend. Patient denies SI,HI and AVH at this time. Skin assessment and body search done with [facility] RN. No contraband found.Oriented to unit and made comfortable in the unit. Support and encouragement given.
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
## 2022-03-03 NOTE — BHH Group Notes (Signed)
[facility] Group Notes:  (Nursing/MHT/Case Management/Adjunct)  Date:  03/03/2022  Time:  10:19 PM  Type of Therapy:  Group Therapy  Participation Level:  Minimal  Participation Quality:  Appropriate  Affect:  Flat  Cognitive:  Alert and Appropriate  Insight:  Appropriate  Engagement in Group:  Limited  Modes of Intervention:  Discussion  Summary of Progress/Problems: Wrap up group completed during snack time.   Maglione,Kjersti Dittmer E 03/03/2022, 10:19 PM

## 2022-03-03 NOTE — ED Notes (Signed)
TTS staff speaking with pt at this time.

## 2022-03-03 NOTE — Progress Notes (Signed)
Pt was accepted to [facility] 03/03/2022, pending IVC paperwork faxed to [phone]  Pt meets inpatient criteria per Charmaine Downs, NP  Attending Physician will be Caren Griffins, DO  Report can be called to: [phone]  Bed is ready now  Care Team Notified: [facility] Scharlene Gloss, RN, Lynnda Shields, RN, Charmaine Downs, NP, Wilmon Arms, RN, Tyler Pita, RN, and [employer], [place]  03/03/2022 11:56 AM

## 2022-03-04 DIAGNOSIS — F332 Major depressive disorder, recurrent severe without psychotic features: Secondary | ICD-10-CM | POA: Diagnosis not present

## 2022-03-04 MED ORDER — QUETIAPINE FUMARATE 25 MG PO TABS: 50.0000 mg | ORAL_TABLET | Freq: Every evening | ORAL | Status: DC | PRN

## 2022-03-04 MED ORDER — HYDROXYZINE HCL 50 MG PO TABS
50.0000 mg | ORAL_TABLET | Freq: Four times a day (QID) | ORAL | Status: DC | PRN
  Administered 2022-03-07 – 2022-03-09 (×3): 50 mg via ORAL
  Filled 2022-03-04 (×3): qty 1

## 2022-03-04 MED ORDER — MIRTAZAPINE 15 MG PO TABS
30.0000 mg | ORAL_TABLET | Freq: Every day | ORAL | Status: DC
  Administered 2022-03-04 – 2022-03-05 (×2): 30 mg via ORAL
  Filled 2022-03-04 (×2): qty 2

## 2022-03-04 MED ORDER — PYRIDOXINE HCL 25 MG PO TABS
25.0000 mg | ORAL_TABLET | Freq: Three times a day (TID) | ORAL | Status: DC
  Administered 2022-03-04 – 2022-03-10 (×18): 25 mg via ORAL
  Filled 2022-03-04 (×19): qty 1

## 2022-03-04 NOTE — Progress Notes (Signed)

## 2022-03-04 NOTE — Plan of Care (Signed)
Patient pleasant and cooperative on approach. Patient states that her anxiety is still there but  depression is better. Denies SI,HI and AVH.  Patient had Zofran x 2 for nausea. Appropriate with staff & peers. Appetite and energy level good. Support and encouragement given.

## 2022-03-04 NOTE — Progress Notes (Signed)
Recreation Therapy Notes   Date: 03/04/2022  Time: 10:15 am     Location: Craft room   Behavioral response: Appropriate  Intervention Topic:  Leisure    Discussion/Intervention:  Group content today was focused on leisure. The group defined what leisure is and some positive leisure activities they participate in. Individuals identified the difference between good and bad leisure. Participants expressed how they feel after participating in the leisure of their choice. The group discussed how they go about picking a leisure activity and if others are involved in their leisure activities. The patient stated how many leisure activities they have to choose from and reasons why it is important to have leisure time. Individuals participated in the intervention "Exploration of Leisure" where they had a chance to identify new leisure activities as well as benefits of leisure. Clinical Observations/Feedback: Patient came to group and was focused on what peers and staff had to say about leisure. Individual was social with staff and peers while participating in the intervention.  Shanara Schnieders LRT/CTRS         Graycen Sadlon 03/04/2022 1:33 PM

## 2022-03-04 NOTE — Group Note (Signed)
LCSW Group Therapy Note  Group Date: 03/04/2022 Start Time: 1300 End Time: 1400   Type of Therapy and Topic:  Group Therapy - Healthy vs Unhealthy Coping Skills  Participation Level:  Active   Description of Group The focus of this group was to determine what unhealthy coping techniques typically are used by group members and what healthy coping techniques would be helpful in coping with various problems. Patients were guided in becoming aware of the differences between healthy and unhealthy coping techniques. Patients were asked to identify 2-3 healthy coping skills they would like to learn to use more effectively.  Therapeutic Goals Patients learned that coping is what human beings do all day long to deal with various situations in their lives Patients defined and discussed healthy vs unhealthy coping techniques Patients identified their preferred coping techniques and identified whether these were healthy or unhealthy Patients determined 2-3 healthy coping skills they would like to become more familiar with and use more often. Patients provided support and ideas to each other   Summary of Patient Progress:   Patient was present for the entirety of the group session. Patient was an active listener and participated in the topic of discussion, provided helpful advice to others, and added nuance to topic of conversation. Patient shared who remaining disciplined with her routine positively impacted her confidence in the past. Patients were encouraged to develop scheduled coping/self care routine.    Therapeutic Modalities Cognitive Behavioral Therapy Motivational Interviewing  Larose Kells 03/04/2022  2:39 PM
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
## 2022-03-04 NOTE — Progress Notes (Signed)
Patients FHS 160.
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
## 2022-03-04 NOTE — BHH Suicide Risk Assessment (Signed)
[place] INPATIENT:  Family/Significant Other Suicide Prevention Education  Suicide Prevention Education:  Education Completed; [place]/boyfriend [phone]), has been identified by the patient as the family member/significant other with whom the patient will be residing, and identified as the person(s) who will aid the patient in the event of a mental health crisis (suicidal ideations/suicide attempt).  With written consent from the patient, the family member/significant other has been provided the following suicide prevention education, prior to the and/or following the discharge of the patient.  The suicide prevention education provided includes the following: Suicide risk factors Suicide prevention and interventions National Suicide Hotline telephone number [facility] assessment telephone number [facility] Emergency Assistance Center and/or Residential Mobile Crisis Unit telephone number  Request made of family/significant other to: Remove weapons (e.g., guns, rifles, knives), all items previously/currently identified as safety concern.   Remove drugs/medications (over-the-counter, prescriptions, illicit drugs), all items previously/currently identified as a safety concern.  The family member/significant other verbalizes understanding of the suicide prevention education information provided.  The family member/significant other agrees to remove the items of safety concern listed above.  Jerelene Redden shares that pt had a mental breakdown and feels that her hormones are all over the place. He states that he feels she just overreacted because she did not like the way that she was feeling at that time. He shares that he feels that medication to help with her anxiety will be important for her moving forward. CSW explained that the psychiatrist will work with the pt while here in order to get her on the proper regimen that will help her moving forward. He denied feeling  that pt is a danger to herself or anyone else. Jerelene Redden and CSW discussed access to weapons. He denied her having access to a gun but shared that there is always kitchen knives. CSW encouraged Jerelene Redden that things like knives, razors, and medication should be locked up to limit her access. Jerelene Redden voiced plans to limit her access to such things to only when he can give them to her. Jerelene Redden inquired regarding how long pt would be in the hospital. CSW explained that the typical stay is approximately a week. No other concerns expressed. Contact ended without incident.   Shirl Harris 03/04/2022, 2:27 PM

## 2022-03-04 NOTE — BHH Counselor (Signed)
CSW reached out to the [facility] [phone]) regarding pt hospital and to reschedule appointment. Contact unable to be established but voicemail left with contact information for follow through.   Chalmers Guest. Guerry Bruin, MSW, LCSW, [place] 03/04/2022 2:19 PM

## 2022-03-04 NOTE — BHH Group Notes (Signed)
[facility] Group Notes:  (Nursing/MHT/Case Management/Adjunct)  Date:  03/04/2022  Time:  9:58 AM  Type of Therapy:   Community Group   Participation Level:  Active  Participation Quality:  Appropriate and Attentive  Affect:  Appropriate  Cognitive:  Alert and Appropriate  Insight:  Appropriate  Engagement in Group:  Engaged  Modes of Intervention:  Discussion, Education, and Support  Summary of Progress/Problems:  Adela Lank Emiliano Welshans 03/04/2022, 9:58 AM

## 2022-03-04 NOTE — Consult Note (Signed)
ANTEPARTUM PROGRESS NOTE  Maira Christon is a 22 y.o. G3P2002 at [redacted]w[redacted]d who is admitted for overdose in pregnancy.   Estimated Date of Delivery: 09/07/22  Length of Stay:  1 Days. Admitted 03/03/2022  Subjective: States she is feeling better this evening, reports some right sciatic pain and occasional sharp pains in her vagina. Reports having some nausea but generally feeling okay about the pregnancy right now. Reports having nausea during almost her whole pregnancy both times previously.   She reports:  -active fetal movement -no leakage of fluid -no vaginal bleeding -no contractions  Vitals:  BP 121/73 (BP Location: Left Arm)   Pulse 87   Temp 99.1 F (37.3 C) (Oral)   Resp 20   Ht 5\' 2"  (1.575 m)   Wt 77.1 kg   LMP 11/16/2021 (Exact Date)   SpO2 100%   BMI 31.09 kg/m  Physical Exam Constitutional:      Appearance: Normal appearance.  HENT:     Head: Normocephalic.  Cardiovascular:     Rate and Rhythm: Normal rate.     Pulses: Normal pulses.  Pulmonary:     Effort: Pulmonary effort is normal.  Abdominal:     Palpations: Abdomen is soft.  Musculoskeletal:        General: Normal range of motion.     Cervical back: Normal range of motion.  Neurological:     Mental Status: She is alert and oriented to person, place, and time.  Skin:    General: Skin is warm and dry.  Psychiatric:        Mood and Affect: Mood normal.        Behavior: Behavior normal.        Thought Content: Thought content normal.        Judgment: Judgment normal.      Fetal monitoring: 160 BPM No results found for this or any previous visit (from the past 48 hour(s)).   No results found.  Current scheduled medications  mirtazapine  30 mg Oral QHS   prenatal multivitamin  1 tablet Oral Daily    I have reviewed the patient's current medications. Patient pleasant and cooperative and forthcoming with information  ASSESSMENT: Patient Active Problem List   Diagnosis Date Noted    Severe recurrent major depression without psychotic features ([place]) 03/03/2022   ASCUS with positive high risk HPV cervical 04/11/2021   Vaginal discharge 04/11/2021   Deliberate self-cutting 03/20/2020   Generalized anxiety disorder with panic attacks 02/14/2020   Major depressive disorder affecting pregnancy 02/14/2020   Poor social situation 02/14/2020   History of pyelonephritis during pregnancy 02/14/2020   Pregnancy 02/13/2020   PTSD (post-traumatic stress disorder) 03/03/2018   Hydronephrosis, right 10/03/2015    PLAN: -daily FHT with doppler - continue behavioral health mgmt - add Vitamin B6 TID with zofran for nausea mgmt.  Continue routine antenatal care.  Francetta Found, CNM 03/04/2022 7:35 PM  [facility]
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
## 2022-03-04 NOTE — Progress Notes (Signed)
Patient pleasant and cooperative. Socializing with peers. Denies SI, HI, AVH. Reports feeling better. Goal is to get better and go home. Did not want to elaborate on mood with this Probation officer. Medication complaint. Encouragement and support provided. Safety checks maintained. Medications given as prescribed. Pt receptive and remains safe on unit with q 15 min checks.

## 2022-03-04 NOTE — Plan of Care (Signed)
  Problem: Education: °Goal: Emotional status will improve °Outcome: Progressing °Goal: Mental status will improve °Outcome: Progressing °Goal: Verbalization of understanding the information provided will improve °Outcome: Progressing °  °

## 2022-03-05 DIAGNOSIS — F332 Major depressive disorder, recurrent severe without psychotic features: Secondary | ICD-10-CM | POA: Diagnosis not present

## 2022-03-05 NOTE — Progress Notes (Signed)
Recreation Therapy Notes  INPATIENT RECREATION TR PLAN  Patient Details Name: Victoria Garrett MRN: [id number] DOB: September 30, 2000 Today's Date: 03/05/2022  Rec Therapy Plan Is patient appropriate for Therapeutic Recreation?: Yes Treatment times per week: at least 3 Estimated Length of Stay: 5-7 days TR Treatment/Interventions: Group participation (Comment)  Discharge Criteria Pt will be discharged from therapy if:: Discharged Treatment plan/goals/alternatives discussed and agreed upon by:: Patient/family  Discharge Summary     [name] 03/05/2022, 1:19 PM

## 2022-03-05 NOTE — Progress Notes (Signed)
Recreation Therapy Notes  INPATIENT RECREATION THERAPY ASSESSMENT  Patient Details Name: Victoria Garrett MRN: [id number] DOB: 03/01/2000 Today's Date: 03/05/2022       Information Obtained From: Patient  Able to Participate in Assessment/Interview: Yes  Patient Presentation: Responsive  Reason for Admission (Per Patient): Suicidal Ideation, Suicide Attempt, Active Symptoms  Patient Stressors: Other (Comment) (Healthy)  Coping Skills:   Avoidance, Isolation, Other (Comment) (Sleep)  Leisure Interests (2+):  Art - Paint, Art - Draw, Individual - TV, Individual - Napping  Frequency of Recreation/Participation: Monthly  Awareness of Community Resources:  Yes  Community Resources:  Park  Current Use: Yes  If no, Barriers?:    Expressed Interest in [place]: Yes  County of Residence:  [place]  Patient Main Form of Transportation: Musician  Patient Strengths:  Nice  Patient Identified Areas of Improvement:  My confidence and self-esteem  Patient Goal for Hospitalization:  Gain coping skills and work on my anxiety  Current SI (including self-harm):  No  Current HI:  No  Current AVH: No  Staff Intervention Plan: Group Attendance, Collaborate with Interdisciplinary Treatment Team  Consent to Intern Participation: N/A  Dustine Stickler 03/05/2022, 1:16 PM

## 2022-03-05 NOTE — Plan of Care (Signed)
  Problem: Health Behavior/Discharge Planning: Goal: Compliance with treatment plan for underlying cause of condition will improve Outcome: Progressing   Problem: Coping: Goal: Coping ability will improve Outcome: Progressing   Problem: Self-Concept: Goal: Level of anxiety will decrease Outcome: Progressing

## 2022-03-05 NOTE — BH IP Treatment Plan (Signed)
Interdisciplinary Treatment and Diagnostic Plan Update  03/05/2022 Time of Session: 0830 Victoria Garrett MRN: [id number]  Principal Diagnosis: Severe recurrent major depression without psychotic features [facility])  Secondary Diagnoses: Principal Problem:   Severe recurrent major depression without psychotic features ([place]) Active Problems:   Pregnancy   Current Medications:  Current Facility-Administered Medications  Medication Dose Route Frequency Provider Last Rate Last Admin   acetaminophen (TYLENOL) tablet 650 mg  650 mg Oral Q6H PRN Clapacs, John T, MD   650 mg at 03/04/22 1609   alum & mag hydroxide-simeth (MAALOX/MYLANTA) 200-200-20 MG/5ML suspension 30 mL  30 mL Oral Q4H PRN Clapacs, [name], MD       hydrOXYzine (ATARAX) tablet 50 mg  50 mg Oral Q6H PRN Clapacs, John T, MD       magnesium hydroxide (MILK OF MAGNESIA) suspension 30 mL  30 mL Oral Daily PRN Clapacs, John T, MD       mirtazapine (REMERON) tablet 30 mg  30 mg Oral QHS Clapacs, John T, MD   30 mg at 03/04/22 2101   ondansetron (ZOFRAN-ODT) disintegrating tablet 4 mg  4 mg Oral Q6H PRN [name], CNM   4 mg at 03/04/22 1413   prenatal multivitamin tablet 1 tablet  1 tablet Oral Daily Clapacs, John T, MD       pyridOXINE (VITAMIN B6) tablet 25 mg  25 mg Oral TID McVey, Rebecca A, CNM   25 mg at 03/05/22 0855   QUEtiapine (SEROQUEL) tablet 50 mg  50 mg Oral QHS PRN Clapacs, [name], MD       PTA Medications: Medications Prior to Admission  Medication Sig Dispense Refill Last Dose   mirtazapine (REMERON) 15 MG tablet Take 15 mg by mouth at bedtime.   Past Week   Prenatal Vit-Fe Phos-FA-Omega (VITAFOL GUMMIES) 3.33-0.333-34.8 MG CHEW Chew 1 tablet by mouth daily. 90 tablet 5 Past Week   metroNIDAZOLE (METROGEL) 0.75 % vaginal gel Place 1 Applicatorful vaginally at bedtime. Apply one applicatorful to vagina at bedtime for 5 days (Patient not taking: Reported on 01/20/2022) 70 g 1 Not Taking    promethazine (PHENERGAN) 25 MG tablet Take 1 tablet (25 mg total) by mouth every 6 (six) hours as needed for nausea or vomiting. (Patient not taking: Reported on 03/04/2022) 30 tablet 1 Not Taking    Patient Stressors: Health problems   Medication change or noncompliance    Patient Strengths: Capable of independent living  Communication skills  Supportive family/friends   Treatment Modalities: Medication Management, Group therapy, Case management,  1 to 1 session with clinician, Psychoeducation, Recreational therapy.   Physician Treatment Plan for Primary Diagnosis: Severe recurrent major depression without psychotic features ([place]) Long Term Goal(s): Improvement in symptoms so as ready for discharge   Short Term Goals: Ability to identify and develop effective coping behaviors will improve Ability to maintain clinical measurements within normal limits will improve Compliance with prescribed medications will improve Ability to verbalize feelings will improve Ability to disclose and discuss suicidal ideas Ability to demonstrate self-control will improve  Medication Management: Evaluate patient's response, side effects, and tolerance of medication regimen.  Therapeutic Interventions: 1 to 1 sessions, Unit Group sessions and Medication administration.  Evaluation of Outcomes: Progressing  Physician Treatment Plan for Secondary Diagnosis: Principal Problem:   Severe recurrent major depression without psychotic features ([place]) Active Problems:   Pregnancy  Long Term Goal(s): Improvement in symptoms so as ready for discharge   Short Term Goals: Ability to identify  and develop effective coping behaviors will improve Ability to maintain clinical measurements within normal limits will improve Compliance with prescribed medications will improve Ability to verbalize feelings will improve Ability to disclose and discuss suicidal ideas Ability to demonstrate self-control will improve      Medication Management: Evaluate patient's response, side effects, and tolerance of medication regimen.  Therapeutic Interventions: 1 to 1 sessions, Unit Group sessions and Medication administration.  Evaluation of Outcomes: Progressing   RN Treatment Plan for Primary Diagnosis: Severe recurrent major depression without psychotic features ([place]) Long Term Goal(s): Knowledge of disease and therapeutic regimen to maintain health will improve  Short Term Goals: Ability to remain free from injury will improve, Ability to verbalize frustration and anger appropriately will improve, Ability to demonstrate self-control, Ability to participate in decision making will improve, Ability to verbalize feelings will improve, Ability to disclose and discuss suicidal ideas, Ability to identify and develop effective coping behaviors will improve, and Compliance with prescribed medications will improve  Medication Management: RN will administer medications as ordered by provider, will assess and evaluate patient's response and provide education to patient for prescribed medication. RN will report any adverse and/or side effects to prescribing provider.  Therapeutic Interventions: 1 on 1 counseling sessions, Psychoeducation, Medication administration, Evaluate responses to treatment, Monitor vital signs and CBGs as ordered, Perform/monitor CIWA, COWS, AIMS and Fall Risk screenings as ordered, Perform wound care treatments as ordered.  Evaluation of Outcomes: Progressing   LCSW Treatment Plan for Primary Diagnosis: Severe recurrent major depression without psychotic features ([place]) Long Term Goal(s): Safe transition to appropriate next level of care at discharge, Engage patient in therapeutic group addressing interpersonal concerns.  Short Term Goals: Engage patient in aftercare planning with referrals and resources, Increase social support, Increase ability to appropriately verbalize feelings, Increase emotional  regulation, Facilitate acceptance of mental health diagnosis and concerns, Facilitate patient progression through stages of change regarding substance use diagnoses and concerns, Identify triggers associated with mental health/substance abuse issues, and Increase skills for wellness and recovery  Therapeutic Interventions: Assess for all discharge needs, 1 to 1 time with Social worker, Explore available resources and support systems, Assess for adequacy in community support network, Educate family and significant other(s) on suicide prevention, Complete Psychosocial Assessment, Interpersonal group therapy.  Evaluation of Outcomes: Progressing   Progress in Treatment: Attending groups: Yes. Participating in groups: Yes. Taking medication as prescribed: Yes. Toleration medication: Yes. Family/Significant other contact made: Yes, individual(s) contacted:  [place]/boyfriend [phone]) Patient understands diagnosis: Yes. Discussing patient identified problems/goals with staff: Yes. Medical problems stabilized or resolved: Yes. Denies suicidal/homicidal ideation: No. Issues/concerns per patient self-inventory: Yes. Other: none  New problem(s) identified: No, Describe:  none  New Short Term/Long Term Goal(s): Patient to work towards medication management for mood stabilization; elimination of SI thoughts; development of comprehensive mental wellness plan.  Patient Goals:  Patient states their goal for treatment is to "(get) some coping skills."  Discharge Plan or Barriers: No psychosocial barriers identified at this time, patient to return to place of residence when appropriate for discharge.    Reason for Continuation of Hospitalization: Depression Medication stabilization Suicidal ideation  Estimated Length of Stay: 1-7 days   Last 3 [place] Suicide Severity Risk Score: Flowsheet Row Admission (Current) from 03/03/2022 in [place] ED from 03/01/2022 in  [place] DEPT Admission (Discharged) from 12/25/2021 in [place] Assessment Unit  C-SSRS RISK CATEGORY No Risk High Risk No Risk  Last PHQ 2/9 Scores:    01/30/2022   10:06 AM 05/28/2020    9:21 AM 02/14/2020   11:38 AM  Depression screen PHQ 2/9  Decreased Interest 1 2 0  Down, Depressed, Hopeless 1 1 0  PHQ - 2 Score 2 3 0  Altered sleeping 2 2 2   Tired, decreased energy 2 2 2   Change in appetite 2 2 2   Feeling bad or failure about yourself  0 1 0  Trouble concentrating 1 1 2   Moving slowly or fidgety/restless 1 2 0  Suicidal thoughts 0 0 0  PHQ-9 Score 10 13 8   Difficult doing work/chores   Somewhat difficult    Scribe for Treatment Team: 03/05/2022 9:40 AM

## 2022-03-05 NOTE — Group Note (Signed)
[place] LCSW Group Therapy Note   Group Date: 03/05/2022 Start Time: 1300 End Time: 1400   Type of Therapy/Topic:  Group Therapy:  Emotion Regulation  Participation Level:  None   Mood:  Description of Group:    The purpose of this group is to assist patients in learning to regulate negative emotions and experience positive emotions. Patients will be guided to discuss ways in which they have been vulnerable to their negative emotions. These vulnerabilities will be juxtaposed with experiences of positive emotions or situations, and patients challenged to use positive emotions to combat negative ones. Special emphasis will be placed on coping with negative emotions in conflict situations, and patients will process healthy conflict resolution skills.  Therapeutic Goals: Patient will identify two positive emotions or experiences to reflect on in order to balance out negative emotions:  Patient will label two or more emotions that they find the most difficult to experience:  Patient will be able to demonstrate positive conflict resolution skills through discussion or role plays:   Summary of Patient Progress: Patient was present in group.  Patient did not engage in group, sat with her head down and did not appear to follow along with group discussions.    Therapeutic Modalities:   Cognitive Behavioral Therapy Feelings Identification Dialectical Behavioral Therapy   Rozann Lesches, LCSW

## 2022-03-05 NOTE — Progress Notes (Signed)
Recreation Therapy Notes   Date: 03/05/2022  Time: 10:25 am     Location: Craft room   Behavioral response: Appropriate  Intervention Topic: Stress-Management  Discussion/Intervention:  Group content on today was focused on stress. The group defined stress and way to cope with stress. Participants expressed how they know when they are stresses out. Individuals described the different ways they have to cope with stress. The group stated reasons why it is important to cope with stress. Patient explained what good stress is and some examples. The group participated in the intervention "Stress Management". Individuals were separated into two group and answered questions related to stress.  Clinical Observations/Feedback: Patient came to group and defined stress as a painful process. She stated that she could work on her stress management because she makes impulsive decisions. Individual was social with staff and peers while participating in the intervention.  Gertrude Bucks LRT/CTRS           Lovetta Condie 03/05/2022 1:38 PM

## 2022-03-05 NOTE — Progress Notes (Signed)
Patient appears flat with depressed mood and states feeling "very tired." Patient denies SI, HI, and A/V/H with no plan/intent. Patient is partially med compliant. Patient has been refusing her prenatal vitamins due to it being "too big" and "I have a hard time swallowing pills." Patient denies any pain but states she has noticed a bigger mucus ball that she appeared concerned about and stated she would talk to Obgyn and MD about. Patient reports wanting to work on "coping skills" during her stay. Per social worker patient was laying with her head down during group session today. Patient stated her anxiety is a 3 out of 10 and depression a 0. Patient reports no further questions or concerns and remains cooperative on unit.    03/05/22 0856  Psych Admission Type (Psych Patients Only)  Admission Status Involuntary  Psychosocial Assessment  Patient Complaints Anxiety  Eye Contact Fair  Facial Expression Anxious;Worried  Affect Depressed  Catering manager Activity Slow  Appearance/Hygiene Unremarkable  Behavior Characteristics Cooperative;Anxious  Mood Depressed;Anxious  Thought Process  Coherency WDL  Content WDL  Delusions None reported or observed  Perception WDL  Hallucination None reported or observed  Judgment Limited  Confusion None  Danger to Self  Current suicidal ideation? Denies  Danger to Others  Danger to Others None reported or observed  Danger to Others Abnormal  Harmful Behavior to others No threats or harm toward other people  Destructive Behavior No threats or harm toward property

## 2022-03-05 NOTE — Progress Notes (Signed)
[facility] MD Progress Note  03/05/2022 10:08 AM Victoria Garrett  MRN:  [id number] Subjective: Follow-up this 22 year old woman pregnant around 13 weeks with major depression and recent suicide attempt.  Patient attended treatment team today.  Slept better last night.  Still feeling tired today but awake and interactive.  Affect still blunted and dysphoric.  Cooperating with treatment. Principal Problem: Severe recurrent major depression without psychotic features ([place]) Diagnosis: Principal Problem:   Severe recurrent major depression without psychotic features ([place]) Active Problems:   Pregnancy  Total Time spent with patient: 20 minutes  Past Psychiatric History: Patient has a past history of depression and pregnancy.  Previous suicide attempts.  Past Medical History:  Past Medical History:  Diagnosis Date   Anemia    Benign gestational thrombocytopenia in third trimester ([place]) 02/28/2020   Plts 135 at new OB, then 127 at 28 weeks Check platelets at 36 week visit [ ]     Heart murmur    as a child    Preterm uterine contractions in third trimester, antepartum 02/28/2020   Supervision of other normal pregnancy, antepartum 02/13/2020    Nursing Staff Provider Office Location [place]  Dating   6w 5d [place] Language  ENGLISH Anatomy US   WNL visible in Care everywhere 10/2019 Flu Vaccine  02/14/2020 Genetic Screen  NIPS:   AFP:   First Screen:  Quad:   TDaP Vaccine   02/14/2020 Hgb A1C or  GTT Normal 1 hour 01/04/2020 per [place] Vaccine NOT VACCINATED   LAB RESULTS  Rhogam   Blood Type    B POS Feeding Plan BREASTFEED A   UTI in pregnancy 03/02/2020   Vs bacteriuria. Treated 1/14. [ ]  TOC    Past Surgical History:  Procedure Laterality Date   IR NEPHROURETERAL CATH PLACE RIGHT Right 10/2015   NEPHROSTOMY TUBE PLACEMENT ([place] HX)     Family History:  Family History  Problem Relation Age of Onset   Healthy Mother    Healthy Father    Asthma Sister    Diabetes Maternal  Grandmother    Family Psychiatric  History: See previous Social History:  Social History   Substance and Sexual Activity  Alcohol Use Not Currently     Social History   Substance and Sexual Activity  Drug Use Not Currently   Types: Marijuana    Social History   Socioeconomic History   Marital status: Single    Spouse name: Not on file   Number of children: 1   Years of education: Not on file   Highest education level: Not on file  Occupational History   Not on file  Tobacco Use   Smoking status: Never   Smokeless tobacco: Never  Vaping Use   Vaping Use: Some days  Substance and Sexual Activity   Alcohol use: Not Currently   Drug use: Not Currently    Types: Marijuana   Sexual activity: Yes    Partners: Male    Birth control/protection: None  Other Topics Concern   Not on file  Social History Narrative   Not on file   Social Determinants of Health   Financial Resource Strain: Not on file  Food Insecurity: No Food Insecurity (03/03/2022)   Hunger Vital Sign    Worried About Running Out of Food in the Last Year: Never true    Ran Out of Food in the Last Year: Never true  Transportation Needs: No Transportation Needs (03/03/2022)   PRAPARE - Transportation  Lack of Transportation (Medical): No    Lack of Transportation (Non-Medical): No  Physical Activity: Not on file  Stress: Not on file  Social Connections: Not on file   Additional Social History:                         Sleep: Fair  Appetite:  Fair  Current Medications: Current Facility-Administered Medications  Medication Dose Route Frequency Provider Last Rate Last Admin   acetaminophen (TYLENOL) tablet 650 mg  650 mg Oral Q6H PRN Kylor Valverde T, MD   650 mg at 03/04/22 1609   alum & mag hydroxide-simeth (MAALOX/MYLANTA) 200-200-20 MG/5ML suspension 30 mL  30 mL Oral Q4H PRN Kalani Sthilaire, Jackquline Denmark, MD       hydrOXYzine (ATARAX) tablet 50 mg  50 mg Oral Q6H PRN Shylin Keizer T, MD        magnesium hydroxide (MILK OF MAGNESIA) suspension 30 mL  30 mL Oral Daily PRN Sahvanna Mcmanigal T, MD       mirtazapine (REMERON) tablet 30 mg  30 mg Oral QHS Ulus Hazen T, MD   30 mg at 03/04/22 2101   ondansetron (ZOFRAN-ODT) disintegrating tablet 4 mg  4 mg Oral Q6H PRN Chari Manning Rolla Plate, CNM   4 mg at 03/04/22 1413   prenatal multivitamin tablet 1 tablet  1 tablet Oral Daily Rainy Rothman T, MD       pyridOXINE (VITAMIN B6) tablet 25 mg  25 mg Oral TID McVey, Rebecca A, CNM   25 mg at 03/05/22 0855   QUEtiapine (SEROQUEL) tablet 50 mg  50 mg Oral QHS PRN Xiomar Crompton, Jackquline Denmark, MD        Lab Results: No results found for this or any previous visit (from the past 48 hour(s)).  Blood Alcohol level:  Lab Results  Component Value Date   ETH <10 03/01/2022    Metabolic Disorder Labs: Lab Results  Component Value Date   HGBA1C 5.5 12/12/2020   No results found for: "PROLACTIN" No results found for: "CHOL", "TRIG", "HDL", "CHOLHDL", "VLDL", "LDLCALC"  Physical Findings: AIMS:  , ,  ,  ,    CIWA:    COWS:     Musculoskeletal: Strength & Muscle Tone: within normal limits Gait & Station: normal Patient leans: N/A  Psychiatric Specialty Exam:  Presentation  General Appearance: No data recorded Eye Contact:No data recorded Speech:No data recorded Speech Volume:No data recorded Handedness:No data recorded  Mood and Affect  Mood:No data recorded Affect:No data recorded  Thought Process  Thought Processes:No data recorded Descriptions of Associations:No data recorded Orientation:No data recorded Thought Content:No data recorded History of Schizophrenia/Schizoaffective disorder:No  Duration of Psychotic Symptoms:No data recorded Hallucinations:No data recorded Ideas of Reference:No data recorded Suicidal Thoughts:No data recorded Homicidal Thoughts:No data recorded  Sensorium  Memory:No data recorded Judgment:No data recorded Insight:No data  recorded  Executive Functions  Concentration:No data recorded Attention Span:No data recorded Recall:No data recorded Fund of Knowledge:No data recorded Language:No data recorded  Psychomotor Activity  Psychomotor Activity:No data recorded  Assets  Assets:No data recorded  Sleep  Sleep:No data recorded   Physical Exam: Physical Exam Vitals and nursing note reviewed.  Constitutional:      Appearance: Normal appearance.  HENT:     Head: Normocephalic and atraumatic.     Mouth/Throat:     Pharynx: Oropharynx is clear.  Eyes:     Pupils: Pupils are equal, round, and reactive to light.  Cardiovascular:  Rate and Rhythm: Normal rate and regular rhythm.  Pulmonary:     Effort: Pulmonary effort is normal.     Breath sounds: Normal breath sounds.  Abdominal:     General: Abdomen is flat.     Palpations: Abdomen is soft.  Musculoskeletal:        General: Normal range of motion.  Skin:    General: Skin is warm and dry.  Neurological:     General: No focal deficit present.     Mental Status: She is alert. Mental status is at baseline.  Psychiatric:        Attention and Perception: Attention normal.        Mood and Affect: Mood normal. Affect is blunt.        Speech: Speech normal.        Behavior: Behavior normal.        Thought Content: Thought content normal.    Review of Systems  Constitutional: Negative.   HENT: Negative.    Eyes: Negative.   Respiratory: Negative.    Cardiovascular: Negative.   Gastrointestinal: Negative.   Musculoskeletal: Negative.   Skin: Negative.   Neurological: Negative.   Psychiatric/Behavioral:  Positive for depression.    Blood pressure 131/72, pulse 86, temperature 98.6 F (37 C), temperature source Oral, resp. rate 18, height 5\' 2"  (1.575 m), weight 77.1 kg, last menstrual period 11/16/2021, SpO2 97 %, not currently breastfeeding. Body mass index is 31.09 kg/m.   Treatment Plan Summary: Medication management and Plan no  change to medication for today.  We had increased the dose of mirtazapine to 30.  Likely increase again tomorrow or the next day.  Encourage group attendance.  Likely length of stay in the range of 4 to 5 days.  Alethia Berthold, MD 03/05/2022, 10:08 AM

## 2022-03-06 MED ORDER — MIRTAZAPINE 15 MG PO TABS
45.0000 mg | ORAL_TABLET | Freq: Every day | ORAL | Status: DC
  Administered 2022-03-06 – 2022-03-09 (×4): 45 mg via ORAL
  Filled 2022-03-06 (×5): qty 3

## 2022-03-06 NOTE — Plan of Care (Signed)
D- Patient alert and oriented. Patient presented in a pleasant mood on assessment reporting that she slept good last night and had no complaints to voice to this Probation officer. Patient denied depression, but endorsed some anxiety, stating "I don't know, maybe I just want to keep myself occupied". Patient also denied SI, HI, AVH, and pain at this time. Per her self-inventory, patient's goal for today is "ways to cope with my emotions", in which she will "try different coping skills", in order to achieve her goal.  A- Scheduled medications administered to patient, per MD orders. Support and encouragement provided.  Routine safety checks conducted every 15 minutes.  Patient informed to notify staff with problems or concerns.  R- No adverse drug reactions noted. Patient contracts for safety at this time. Patient compliant with medications and treatment plan. Patient receptive, calm, and cooperative. Patient interacts well with others on the unit. Patient remains safe at this time.  Problem: Education: Goal: Knowledge of [facility] Education information/materials will improve Outcome: Progressing Goal: Emotional status will improve Outcome: Progressing Goal: Mental status will improve Outcome: Progressing Goal: Verbalization of understanding the information provided will improve Outcome: Progressing   Problem: Health Behavior/Discharge Planning: Goal: Identification of resources available to assist in meeting health care needs will improve Outcome: Progressing Goal: Compliance with treatment plan for underlying cause of condition will improve Outcome: Progressing   Problem: Education: Goal: Utilization of techniques to improve thought processes will improve Outcome: Progressing Goal: Knowledge of the prescribed therapeutic regimen will improve Outcome: Progressing   Problem: Activity: Goal: Interest or engagement in leisure activities will improve Outcome: Progressing Goal: Imbalance in  normal sleep/wake cycle will improve Outcome: Progressing   Problem: Coping: Goal: Coping ability will improve Outcome: Progressing Goal: Will verbalize feelings Outcome: Progressing   Problem: Self-Concept: Goal: Will verbalize positive feelings about self Outcome: Progressing Goal: Level of anxiety will decrease Outcome: Progressing

## 2022-03-06 NOTE — Group Note (Signed)
[facility] LCSW Group Therapy Note   Group Date: 03/06/2022 Start Time: 1300 End Time: 1400   Type of Therapy/Topic:  Group Therapy:  Balance in Life  Participation Level:  Active   Description of Group:    This group will address the concept of balance and how it feels and looks when one is unbalanced. Patients will be encouraged to process areas in their lives that are out of balance, and identify reasons for remaining unbalanced. Facilitators will guide patients utilizing problem- solving interventions to address and correct the stressor making their life unbalanced. Understanding and applying boundaries will be explored and addressed for obtaining  and maintaining a balanced life. Patients will be encouraged to explore ways to assertively make their unbalanced needs known to significant others in their lives, using other group members and facilitator for support and feedback.  Therapeutic Goals: Patient will identify two or more emotions or situations they have that consume much of in their lives. Patient will identify signs/triggers that life has become out of balance:  Patient will identify two ways to set boundaries in order to achieve balance in their lives:  Patient will demonstrate ability to communicate their needs through discussion and/or role plays  Summary of Patient Progress: Patient was present for the entirety of the group process. She defined balance as staying on a schedule. Drama was identified as something that can cause her to become off balance. She stated that practicing/utilizing one's coping skills can help them remained balanced. Pt also talked about the importance for her of writing things down to keep them from being forgotten. She stated that one new thing that she can do to help maintain balance once she has left here is to engage in new hobbies and things that she enjoys. She appeared open and receptive to the comments/feedback from both peers and facilitator. Pt has  slight insight into the topic.    Therapeutic Modalities:   Cognitive Behavioral Therapy Solution-Focused Therapy Assertiveness Training   Shirl Harris, LCSW

## 2022-03-06 NOTE — Progress Notes (Signed)
[facility] MD Progress Note  03/06/2022 12:50 PM Victoria Garrett  MRN:  [id number] Subjective: Chart reviewed.  Patient seen.  22 year old with severe depression and recent suicide attempt.  Patient reports she is feeling better.  She has been sleeping consistently at night.  Up and alert during the day.  Goes to therapeutic activities.  No physical complaints.  Denies any suicidal thoughts.  Affect seems brighter. Principal Problem: Severe recurrent major depression without psychotic features ([place]) Diagnosis: Principal Problem:   Severe recurrent major depression without psychotic features ([place]) Active Problems:   Pregnancy  Total Time spent with patient: 30 minutes  Past Psychiatric History: Past history of depression with a recent suicide attempt after discharge  Past Medical History:  Past Medical History:  Diagnosis Date   Anemia    Benign gestational thrombocytopenia in third trimester ([place]) 02/28/2020   Plts 135 at new OB, then 127 at 28 weeks Check platelets at 36 week visit [ ]     Heart murmur    as a child    Preterm uterine contractions in third trimester, antepartum 02/28/2020   Supervision of other normal pregnancy, antepartum 02/13/2020    Nursing Staff Provider Office Location [place]  Dating   6w 5d [place] Language  ENGLISH Anatomy US   WNL visible in Care everywhere 10/2019 Flu Vaccine  02/14/2020 Genetic Screen  NIPS:   AFP:   First Screen:  Quad:   TDaP Vaccine   02/14/2020 Hgb A1C or  GTT Normal 1 hour 01/04/2020 per [place] Vaccine NOT VACCINATED   LAB RESULTS  Rhogam   Blood Type    B POS Feeding Plan BREASTFEED A   UTI in pregnancy 03/02/2020   Vs bacteriuria. Treated 1/14. [ ]  TOC    Past Surgical History:  Procedure Laterality Date   IR NEPHROURETERAL CATH PLACE RIGHT Right 10/2015   NEPHROSTOMY TUBE PLACEMENT ([place] HX)     Family History:  Family History  Problem Relation Age of Onset   Healthy Mother    Healthy Father    Asthma Sister     Diabetes Maternal Grandmother    Family Psychiatric  History: See previous Social History:  Social History   Substance and Sexual Activity  Alcohol Use Not Currently     Social History   Substance and Sexual Activity  Drug Use Not Currently   Types: Marijuana    Social History   Socioeconomic History   Marital status: Single    Spouse name: Not on file   Number of children: 1   Years of education: Not on file   Highest education level: Not on file  Occupational History   Not on file  Tobacco Use   Smoking status: Never   Smokeless tobacco: Never  Vaping Use   Vaping Use: Some days  Substance and Sexual Activity   Alcohol use: Not Currently   Drug use: Not Currently    Types: Marijuana   Sexual activity: Yes    Partners: Male    Birth control/protection: None  Other Topics Concern   Not on file  Social History Narrative   Not on file   Social Determinants of Health   Financial Resource Strain: Not on file  Food Insecurity: No Food Insecurity (03/03/2022)   Hunger Vital Sign    Worried About Running Out of Food in the Last Year: Never true    Ran Out of Food in the Last Year: Never true  Transportation Needs: No Transportation Needs (03/03/2022)  PRAPARE - Hydrologist (Medical): No    Lack of Transportation (Non-Medical): No  Physical Activity: Not on file  Stress: Not on file  Social Connections: Not on file   Additional Social History:                         Sleep: Fair  Appetite:  Fair  Current Medications: Current Facility-Administered Medications  Medication Dose Route Frequency Provider Last Rate Last Admin   acetaminophen (TYLENOL) tablet 650 mg  650 mg Oral Q6H PRN Akanksha Bellmore, [name], MD   650 mg at 03/04/22 1609   alum & mag hydroxide-simeth (MAALOX/MYLANTA) 200-200-20 MG/5ML suspension 30 mL  30 mL Oral Q4H PRN Keyleen Cerrato, [name], MD       hydrOXYzine (ATARAX) tablet 50 mg  50 mg Oral Q6H PRN Trexton Escamilla  T, MD       magnesium hydroxide (MILK OF MAGNESIA) suspension 30 mL  30 mL Oral Daily PRN Eugenia Eldredge T, MD       mirtazapine (REMERON) tablet 45 mg  45 mg Oral QHS Kha Hari T, MD       ondansetron (ZOFRAN-ODT) disintegrating tablet 4 mg  4 mg Oral Q6H PRN [name], CNM   4 mg at 03/06/22 1247   prenatal multivitamin tablet 1 tablet  1 tablet Oral Daily Liadan Guizar T, MD       pyridOXINE (VITAMIN B6) tablet 25 mg  25 mg Oral TID McVey, Rebecca A, CNM   25 mg at 03/06/22 1247   QUEtiapine (SEROQUEL) tablet 50 mg  50 mg Oral QHS PRN Evah Rashid, [name], MD        Lab Results: No results found for this or any previous visit (from the past 48 hour(s)).  Blood Alcohol level:  Lab Results  Component Value Date   ETH <10 64/33/2951    Metabolic Disorder Labs: Lab Results  Component Value Date   HGBA1C 5.5 12/12/2020   No results found for: "PROLACTIN" No results found for: "CHOL", "TRIG", "HDL", "CHOLHDL", "VLDL", "LDLCALC"  Physical Findings: AIMS:  , ,  ,  ,    CIWA:    COWS:     Musculoskeletal: Strength & Muscle Tone: within normal limits Gait & Station: normal Patient leans: N/A  Psychiatric Specialty Exam:  Presentation  General Appearance: No data recorded Eye Contact:No data recorded Speech:No data recorded Speech Volume:No data recorded Handedness:No data recorded  Mood and Affect  Mood:No data recorded Affect:No data recorded  Thought Process  Thought Processes:No data recorded Descriptions of Associations:No data recorded Orientation:No data recorded Thought Content:No data recorded History of Schizophrenia/Schizoaffective disorder:No  Duration of Psychotic Symptoms:No data recorded Hallucinations:No data recorded Ideas of Reference:No data recorded Suicidal Thoughts:No data recorded Homicidal Thoughts:No data recorded  Sensorium  Memory:No data recorded Judgment:No data recorded Insight:No data recorded  Executive  Functions  Concentration:No data recorded Attention Span:No data recorded Recall:No data recorded Fund of Knowledge:No data recorded Language:No data recorded  Psychomotor Activity  Psychomotor Activity:No data recorded  Assets  Assets:No data recorded  Sleep  Sleep:No data recorded   Physical Exam: Physical Exam Vitals and nursing note reviewed.  Constitutional:      Appearance: Normal appearance.  HENT:     Head: Normocephalic and atraumatic.     Mouth/Throat:     Pharynx: Oropharynx is clear.  Eyes:     Pupils: Pupils are equal, round, and reactive to light.  Cardiovascular:     Rate and Rhythm: Normal rate and regular rhythm.  Pulmonary:     Effort: Pulmonary effort is normal.     Breath sounds: Normal breath sounds.  Abdominal:     General: Abdomen is flat.     Palpations: Abdomen is soft.  Musculoskeletal:        General: Normal range of motion.  Skin:    General: Skin is warm and dry.  Neurological:     General: No focal deficit present.     Mental Status: She is alert. Mental status is at baseline.  Psychiatric:        Attention and Perception: Attention normal.        Mood and Affect: Mood normal.        Speech: Speech normal.        Behavior: Behavior normal.        Thought Content: Thought content normal.        Cognition and Memory: Cognition normal.    Review of Systems  Constitutional: Negative.   HENT: Negative.    Eyes: Negative.   Respiratory: Negative.    Cardiovascular: Negative.   Gastrointestinal: Negative.   Musculoskeletal: Negative.   Skin: Negative.   Neurological: Negative.   Psychiatric/Behavioral:  Positive for depression. Negative for hallucinations, substance abuse and suicidal ideas. The patient is not nervous/anxious and does not have insomnia.    Blood pressure 118/75, pulse 86, temperature 99.2 F (37.3 C), temperature source Oral, resp. rate 18, height 5\' 2"  (1.575 m), weight 77.1 kg, last menstrual period 11/16/2021,  SpO2 98 %, not currently breastfeeding. Body mass index is 31.09 kg/m.   Treatment Plan Summary: Medication management and Plan patient appears to be stabilizing.  Recent suicide attempt increases the stakes and makes me want to be sure we are doing a good good job with getting her on therapeutic medicine.  I will be increasing the mirtazapine dose to 45 mg tonight.  Anticipate good tolerance.  Likely discharge after the weekend.  11/18/2021, MD 03/06/2022, 12:50 PM

## 2022-03-06 NOTE — Progress Notes (Signed)
Patient is active on the unit. Gets along well with her peers. She is med compliant and took medication without issue. Denies si hi avh.  Denies depression. Endorses mild anxiety.  No issues or concerns that needed addressing at his encounter. Will continue to monitor. Encouraged her to seek nursing staff with any issues or concerns that may arise.    C Butler-Nicholson, LPN

## 2022-03-06 NOTE — [facility] Group Notes (Signed)
[facility] Group Notes:  (Nursing/MHT/Case Management/Adjunct)  Date:  03/06/2022  Time:  9:51 PM  Type of Therapy:   Wrap up  Participation Level:  Active  Participation Quality:  Appropriate  Affect:  Appropriate  Cognitive:  Alert  Insight:  Good  Engagement in Group:  Engaged  Modes of Intervention:  Support  Summary of Progress/Problems:  Victoria Garrett 03/06/2022, 9:51 PM

## 2022-03-06 NOTE — Plan of Care (Signed)
  Problem: Education: Goal: Knowledge of [facility] Education information/materials will improve Outcome: Progressing Goal: Emotional status will improve Outcome: Progressing Goal: Mental status will improve Outcome: Progressing Goal: Verbalization of understanding the information provided will improve Outcome: Progressing   Problem: Health Behavior/Discharge Planning: Goal: Identification of resources available to assist in meeting health care needs will improve Outcome: Progressing Goal: Compliance with treatment plan for underlying cause of condition will improve Outcome: Progressing   Problem: Self-Concept: Goal: Will verbalize positive feelings about self Outcome: Progressing Goal: Level of anxiety will decrease Outcome: Progressing   Problem: Coping: Goal: Coping ability will improve Outcome: Progressing Goal: Will verbalize feelings Outcome: Progressing   Problem: Activity: Goal: Interest or engagement in leisure activities will improve Outcome: Progressing Goal: Imbalance in normal sleep/wake cycle will improve Outcome: Progressing   

## 2022-03-07 NOTE — Plan of Care (Signed)
D- Patient alert and oriented. Patient presented in an anxious, but pleasant mood on assessment stating that she slept horribly last night, "too much happened last night that triggered me". Patient endorsed slight depression and mild anxiety, stating "just stress and emotional about what happened yesterday". Patient denied SI, HI, AVH, and pain at this time. Patient had no stated goals for today.  A- Scheduled medications administered to patient, per MD orders. Support and encouragement provided.  Routine safety checks conducted every 15 minutes.  Patient informed to notify staff with problems or concerns.  R- No adverse drug reactions noted. Patient contracts for safety at this time. Patient compliant with medications and treatment plan. Patient receptive, calm, and cooperative. Patient interacts well with others on the unit. Patient remains safe at this time.  Problem: Education: Goal: Knowledge of [facility] Education information/materials will improve Outcome: Progressing Goal: Emotional status will improve Outcome: Progressing Goal: Mental status will improve Outcome: Progressing Goal: Verbalization of understanding the information provided will improve Outcome: Progressing   Problem: Health Behavior/Discharge Planning: Goal: Identification of resources available to assist in meeting health care needs will improve Outcome: Progressing Goal: Compliance with treatment plan for underlying cause of condition will improve Outcome: Progressing   Problem: Education: Goal: Utilization of techniques to improve thought processes will improve Outcome: Progressing Goal: Knowledge of the prescribed therapeutic regimen will improve Outcome: Progressing   Problem: Activity: Goal: Interest or engagement in leisure activities will improve Outcome: Progressing Goal: Imbalance in normal sleep/wake cycle will improve Outcome: Progressing   Problem: Coping: Goal: Coping ability will  improve Outcome: Progressing Goal: Will verbalize feelings Outcome: Progressing   Problem: Self-Concept: Goal: Will verbalize positive feelings about self Outcome: Progressing Goal: Level of anxiety will decrease Outcome: Progressing

## 2022-03-07 NOTE — Group Note (Signed)
LCSW Group Therapy Note  Group Date: 03/07/2022 Start Time: 1300 End Time: 1400   Type of Therapy and Topic:  Group Therapy - Healthy vs Unhealthy Coping Skills  Participation Level:  Minimal   Description of Group The focus of this group was to determine what unhealthy coping techniques typically are used by group members and what healthy coping techniques would be helpful in coping with various problems. Patients were guided in becoming aware of the differences between healthy and unhealthy coping techniques. Patients were asked to identify 2-3 healthy coping skills they would like to learn to use more effectively.  Therapeutic Goals Patients learned that coping is what human beings do all day long to deal with various situations in their lives Patients defined and discussed healthy vs unhealthy coping techniques Patients identified their preferred coping techniques and identified whether these were healthy or unhealthy Patients determined 2-3 healthy coping skills they would like to become more familiar with and use more often. Patients provided support and ideas to each other   Summary of Patient Progress:   Patient was present for the entirety of group session. Patient participated in opening and closing remarks. However, patient did not contribute at all to the topic of discussion despite encouraged participation. States she often uses reaction formation as a defense mechanism.   Therapeutic Modalities Cognitive Behavioral Therapy Motivational Interviewing  Larose Kells 03/07/2022  2:51 PM

## 2022-03-07 NOTE — Progress Notes (Signed)
[facility] MD Progress Note  03/07/2022 1:20 PM Victoria Garrett  MRN:  [id number] Subjective: Follow-up 22 year old woman with depression.  Patient says she had an anxious night last night.  Apparently there was some kind of conflict in the day room between another patient and a staff member.  Although it sounds like it had nothing whatsoever to do with Victoria Garrett still had a little panic attack and got very upset and is still feeling nervous today.  Stays withdrawn to her room most of the time.  Tolerating medicine okay. Principal Problem: Severe recurrent major depression without psychotic features ([place]) Diagnosis: Principal Problem:   Severe recurrent major depression without psychotic features ([place]) Active Problems:   Pregnancy  Total Time spent with patient: 30 minutes  Past Psychiatric History: Past history of depression with recent suicide attempt  Past Medical History:  Past Medical History:  Diagnosis Date   Anemia    Benign gestational thrombocytopenia in third trimester ([place]) 02/28/2020   Plts 135 at new OB, then 127 at 28 weeks Check platelets at 36 week visit [ ]     Heart murmur    as a child    Preterm uterine contractions in third trimester, antepartum 02/28/2020   Supervision of other normal pregnancy, antepartum 02/13/2020    Nursing Staff Provider Office Location [place]  Dating   6w 5d [place] Language  ENGLISH Anatomy US   WNL visible in Care everywhere 10/2019 Flu Vaccine  02/14/2020 Genetic Screen  NIPS:   AFP:   First Screen:  Quad:   TDaP Vaccine   02/14/2020 Hgb A1C or  GTT Normal 1 hour 01/04/2020 per [place] Vaccine NOT VACCINATED   LAB RESULTS  Rhogam   Blood Type    B POS Feeding Plan BREASTFEED A   UTI in pregnancy 03/02/2020   Vs bacteriuria. Treated 1/14. [ ]  TOC    Past Surgical History:  Procedure Laterality Date   IR NEPHROURETERAL CATH PLACE RIGHT Right 10/2015   NEPHROSTOMY TUBE PLACEMENT ([place] HX)     Family History:  Family History   Problem Relation Age of Onset   Healthy Mother    Healthy Father    Asthma Sister    Diabetes Maternal Grandmother    Family Psychiatric  History: See previous Social History:  Social History   Substance and Sexual Activity  Alcohol Use Not Currently     Social History   Substance and Sexual Activity  Drug Use Not Currently   Types: Marijuana    Social History   Socioeconomic History   Marital status: Single    Spouse name: Not on file   Number of children: 1   Years of education: Not on file   Highest education level: Not on file  Occupational History   Not on file  Tobacco Use   Smoking status: Never   Smokeless tobacco: Never  Vaping Use   Vaping Use: Some days  Substance and Sexual Activity   Alcohol use: Not Currently   Drug use: Not Currently    Types: Marijuana   Sexual activity: Yes    Partners: Male    Birth control/protection: None  Other Topics Concern   Not on file  Social History Narrative   Not on file   Social Determinants of Health   Financial Resource Strain: Not on file  Food Insecurity: No Food Insecurity (03/03/2022)   Hunger Vital Sign    Worried About Running Out of Food in the Last Year: Never true  Ran Out of Food in the Last Year: Never true  Transportation Needs: No Transportation Needs (03/03/2022)   PRAPARE - Administrator, Civil Service (Medical): No    Lack of Transportation (Non-Medical): No  Physical Activity: Not on file  Stress: Not on file  Social Connections: Not on file   Additional Social History:                         Sleep: Fair  Appetite:  Fair  Current Medications: Current Facility-Administered Medications  Medication Dose Route Frequency Provider Last Rate Last Admin   acetaminophen (TYLENOL) tablet 650 mg  650 mg Oral Q6H PRN Nicholes Hibler T, MD   650 mg at 03/04/22 1609   alum & mag hydroxide-simeth (MAALOX/MYLANTA) 200-200-20 MG/5ML suspension 30 mL  30 mL Oral Q4H PRN  Roxine Whittinghill, Jackquline Denmark, MD       hydrOXYzine (ATARAX) tablet 50 mg  50 mg Oral Q6H PRN Bryston Colocho T, MD   50 mg at 03/07/22 4196   magnesium hydroxide (MILK OF MAGNESIA) suspension 30 mL  30 mL Oral Daily PRN Janisse Ghan T, MD       mirtazapine (REMERON) tablet 45 mg  45 mg Oral QHS Vassie Kugel T, MD   45 mg at 03/06/22 2109   ondansetron (ZOFRAN-ODT) disintegrating tablet 4 mg  4 mg Oral Q6H PRN Chari Manning Rolla Plate, CNM   4 mg at 03/06/22 1247   prenatal multivitamin tablet 1 tablet  1 tablet Oral Daily Estefani Bateson T, MD       pyridOXINE (VITAMIN B6) tablet 25 mg  25 mg Oral TID McVey, Rebecca A, CNM   25 mg at 03/07/22 1233   QUEtiapine (SEROQUEL) tablet 50 mg  50 mg Oral QHS PRN Raissa Dam, Jackquline Denmark, MD        Lab Results: No results found for this or any previous visit (from the past 48 hour(s)).  Blood Alcohol level:  Lab Results  Component Value Date   ETH <10 03/01/2022    Metabolic Disorder Labs: Lab Results  Component Value Date   HGBA1C 5.5 12/12/2020   No results found for: "PROLACTIN" No results found for: "CHOL", "TRIG", "HDL", "CHOLHDL", "VLDL", "LDLCALC"  Physical Findings: AIMS:  , ,  ,  ,    CIWA:    COWS:     Musculoskeletal: Strength & Muscle Tone: within normal limits Gait & Station: normal Patient leans: N/A  Psychiatric Specialty Exam:  Presentation  General Appearance: No data recorded Eye Contact:No data recorded Speech:No data recorded Speech Volume:No data recorded Handedness:No data recorded  Mood and Affect  Mood:No data recorded Affect:No data recorded  Thought Process  Thought Processes:No data recorded Descriptions of Associations:No data recorded Orientation:No data recorded Thought Content:No data recorded History of Schizophrenia/Schizoaffective disorder:No  Duration of Psychotic Symptoms:No data recorded Hallucinations:No data recorded Ideas of Reference:No data recorded Suicidal Thoughts:No data recorded Homicidal  Thoughts:No data recorded  Sensorium  Memory:No data recorded Judgment:No data recorded Insight:No data recorded  Executive Functions  Concentration:No data recorded Attention Span:No data recorded Recall:No data recorded Fund of Knowledge:No data recorded Language:No data recorded  Psychomotor Activity  Psychomotor Activity:No data recorded  Assets  Assets:No data recorded  Sleep  Sleep:No data recorded   Physical Exam: Physical Exam Vitals and nursing note reviewed.  Constitutional:      Appearance: Normal appearance.  HENT:     Head: Normocephalic and atraumatic.  Mouth/Throat:     Pharynx: Oropharynx is clear.  Eyes:     Pupils: Pupils are equal, round, and reactive to light.  Cardiovascular:     Rate and Rhythm: Normal rate and regular rhythm.  Pulmonary:     Effort: Pulmonary effort is normal.     Breath sounds: Normal breath sounds.  Abdominal:     General: Abdomen is flat.     Palpations: Abdomen is soft.  Musculoskeletal:        General: Normal range of motion.  Skin:    General: Skin is warm and dry.  Neurological:     General: No focal deficit present.     Mental Status: She is alert. Mental status is at baseline.  Psychiatric:        Attention and Perception: Attention normal.        Mood and Affect: Mood is anxious.        Speech: Speech normal.        Behavior: Behavior normal.        Thought Content: Thought content normal.        Cognition and Memory: Cognition normal.        Judgment: Judgment normal.    Review of Systems  Constitutional: Negative.   HENT: Negative.    Eyes: Negative.   Respiratory: Negative.    Cardiovascular: Negative.   Gastrointestinal: Negative.   Musculoskeletal: Negative.   Skin: Negative.   Neurological: Negative.   Psychiatric/Behavioral:  The patient is nervous/anxious.    Blood pressure 138/88, pulse (!) 133, temperature 98.2 F (36.8 C), temperature source Oral, resp. rate 16, height 5\' 2"  (1.575  m), weight 77.1 kg, last menstrual period 11/16/2021, SpO2 100 %, not currently breastfeeding. Body mass index is 31.09 kg/m.   Treatment Plan Summary: Medication management and Plan mirtazapine doses up to 45 mg at night.  Low-dose olanzapine continued as well.  Continue medications as dosed for now.  Ongoing assessment of mood.  Still a little sad and down but not actively suicidal.  Likely discharge 2 to 3 days  Alethia Berthold, MD 03/07/2022, 1:20 PM

## 2022-03-07 NOTE — Progress Notes (Signed)
Patient presents with flat affect. Patient slightly anxious due to all the commotion on the unit, Denies any SI, HI, AVH. Medication complaint. Appropriate with staff and peers.  Encouragement and support provided. Safety checks maintained. Meds given as prescribed. Pt receptive and remains safe on unit with q 15 min checks.

## 2022-03-07 NOTE — Plan of Care (Signed)
  Problem: Education: Goal: Emotional status will improve Outcome: Progressing   Problem: Coping: Goal: Coping ability will improve Outcome: Progressing   

## 2022-03-08 MED ORDER — MIRTAZAPINE 45 MG PO TABS: 45.0000 mg | ORAL_TABLET | Freq: Every day | ORAL | 1 refills | Status: DC

## 2022-03-08 MED ORDER — QUETIAPINE FUMARATE 50 MG PO TABS
50.0000 mg | ORAL_TABLET | Freq: Every evening | ORAL | 0 refills | Status: DC | PRN
  Filled 2022-03-08: qty 30, 30d supply, fill #0

## 2022-03-08 MED ORDER — PYRIDOXINE HCL 25 MG PO TABS
25.0000 mg | ORAL_TABLET | Freq: Three times a day (TID) | ORAL | 0 refills | Status: DC
  Filled 2022-03-08: qty 90, 30d supply, fill #0

## 2022-03-08 MED ORDER — ONDANSETRON HCL 4 MG PO TABS
4.0000 mg | ORAL_TABLET | Freq: Four times a day (QID) | ORAL | 0 refills | Status: DC | PRN
  Filled 2022-03-08: qty 60, 15d supply, fill #0

## 2022-03-08 MED ORDER — HYDROXYZINE HCL 50 MG PO TABS
50.0000 mg | ORAL_TABLET | Freq: Four times a day (QID) | ORAL | 0 refills | Status: DC | PRN
  Filled 2022-03-08: qty 60, 15d supply, fill #0

## 2022-03-08 MED ORDER — ONDANSETRON 4 MG PO TBDP: 4.0000 mg | ORAL_TABLET | Freq: Four times a day (QID) | ORAL | 1 refills | Status: DC | PRN

## 2022-03-08 MED ORDER — HYDROXYZINE HCL 50 MG PO TABS: 50.0000 mg | ORAL_TABLET | Freq: Four times a day (QID) | ORAL | 1 refills | Status: DC | PRN

## 2022-03-08 MED ORDER — PYRIDOXINE HCL 25 MG PO TABS: 25.0000 mg | ORAL_TABLET | Freq: Three times a day (TID) | ORAL | 1 refills | Status: DC

## 2022-03-08 MED ORDER — PRENATAL MULTIVITAMIN CH: 1.0000 | ORAL_TABLET | Freq: Every day | ORAL | 1 refills | Status: DC

## 2022-03-08 MED ORDER — PRENATAL MULTIVITAMIN CH
1.0000 | ORAL_TABLET | Freq: Every day | ORAL | 0 refills | Status: DC
  Filled 2022-03-08: qty 30, 30d supply, fill #0

## 2022-03-08 MED ORDER — QUETIAPINE FUMARATE 50 MG PO TABS: 50.0000 mg | ORAL_TABLET | Freq: Every evening | ORAL | 1 refills | Status: DC | PRN

## 2022-03-08 MED ORDER — MIRTAZAPINE 45 MG PO TABS
45.0000 mg | ORAL_TABLET | Freq: Every day | ORAL | 0 refills | Status: DC
  Filled 2022-03-08: qty 30, 30d supply, fill #0

## 2022-03-08 NOTE — [facility] Group Notes (Signed)
LCSW Group Therapy Note   03/08/2022 1:15pm   Type of Therapy and Topic:  Group Therapy:  Overcoming Obstacles   Participation Level:  Active   Description of Group:    In this group patients will be encouraged to explore what they see as obstacles to their own wellness and recovery. They will be guided to discuss their thoughts, feelings, and behaviors related to these obstacles. The group will process together ways to cope with barriers, with attention given to specific choices patients can make. Each patient will be challenged to identify changes they are motivated to make in order to overcome their obstacles. This group will be process-oriented, with patients participating in exploration of their own experiences as well as giving and receiving support and challenge from other group members.   Therapeutic Goals: Patient will identify personal and current obstacles as they relate to admission. Patient will identify barriers that currently interfere with their wellness or overcoming obstacles.  Patient will identify feelings, thought process and behaviors related to these barriers. Patient will identify two changes they are willing to make to overcome these obstacles:      Summary of Patient Progress: good participation by pt, who made a number of contributions to the discussion.  Pt doing "soduku" during group but was engaged as well.  Pt identified "myself" as her biggest obstacle and shared how a trauma history and PTSD make her feel "stuck" in life.  Pt identified going to therapy as one step she could take to overcome her obstacle.       Therapeutic Modalities:   Cognitive Behavioral Therapy Solution Focused Therapy Motivational Interviewing Relapse Prevention Therapy  Joanne Chars, LCSW 03/08/2022 2:41 PM

## 2022-03-08 NOTE — Progress Notes (Signed)
Patient was cooperative with treatment on shift, she denies SI, HI & AVH. She was pleasant on approach, she was compliant with medications and she interacted well with peers and staff on shift.

## 2022-03-08 NOTE — Plan of Care (Signed)
  Problem: Education: Goal: Emotional status will improve Outcome: Progressing Goal: Mental status will improve Outcome: Progressing   Problem: Coping: Goal: Coping ability will improve Outcome: Progressing Goal: Will verbalize feelings Outcome: Progressing

## 2022-03-08 NOTE — Plan of Care (Signed)
D: Patient alert and oriented. Patient denies pain. Patient denies anxiety and depression. Patient denies SI/HI/AVH. Patient requested PRN zofran this morning.  Patient refused scheduled 0800 prenatal multivitamin but was compliant with the other scheduled medications. Patient isolative to room during shift with exception to coming out for meals and medication.   A: Scheduled medications administered to patient, per MD orders.  Support and encouragement provided to patient.  Q15 minute safety checks maintained.   R: Patient compliant with medication administration and treatment plan. No adverse drug reactions noted. Patient remains safe on the unit at this time. Problem: Education: Goal: Knowledge of [place] [facility] Education information/materials will improve Outcome: Progressing Goal: Mental status will improve Outcome: Progressing Goal: Verbalization of understanding the information provided will improve Outcome: Progressing   Problem: Education: Goal: Knowledge of the prescribed therapeutic regimen will improve Outcome: Progressing   Problem: Self-Concept: Goal: Level of anxiety will decrease Outcome: Progressing

## 2022-03-08 NOTE — Progress Notes (Signed)
[facility] MD Progress Note  03/08/2022 12:30 PM Victoria Garrett  MRN:  [id number] Subjective: Follow-up patient reports that her mood is feeling better than it was yesterday.  Less nervous and less depressed.  No current suicidal thoughts.  Tolerating medicine well.  Slept okay last night.  Starting to tolerate medication better. Principal Problem: Severe recurrent major depression without psychotic features ([place]) Diagnosis: Principal Problem:   Severe recurrent major depression without psychotic features ([place]) Active Problems:   Pregnancy  Total Time spent with patient: 30 minutes  Past Psychiatric History: Past history of depression with a recent suicide attempt  Past Medical History:  Past Medical History:  Diagnosis Date   Anemia    Benign gestational thrombocytopenia in third trimester ([place]) 02/28/2020   Plts 135 at new OB, then 127 at 28 weeks Check platelets at 36 week visit [ ]     Heart murmur    as a child    Preterm uterine contractions in third trimester, antepartum 02/28/2020   Supervision of other normal pregnancy, antepartum 02/13/2020    Nursing Staff Provider Office Location [place]  Dating   6w 5d [place] Language  ENGLISH Anatomy US   WNL visible in Care everywhere 10/2019 Flu Vaccine  02/14/2020 Genetic Screen  NIPS:   AFP:   First Screen:  Quad:   TDaP Vaccine   02/14/2020 Hgb A1C or  GTT Normal 1 hour 01/04/2020 per [place] Vaccine NOT VACCINATED   LAB RESULTS  Rhogam   Blood Type    B POS Feeding Plan BREASTFEED A   UTI in pregnancy 03/02/2020   Vs bacteriuria. Treated 1/14. [ ]  TOC    Past Surgical History:  Procedure Laterality Date   IR NEPHROURETERAL CATH PLACE RIGHT Right 10/2015   NEPHROSTOMY TUBE PLACEMENT ([place] HX)     Family History:  Family History  Problem Relation Age of Onset   Healthy Mother    Healthy Father    Asthma Sister    Diabetes Maternal Grandmother    Family Psychiatric  History: See previous Social History:  Social  History   Substance and Sexual Activity  Alcohol Use Not Currently     Social History   Substance and Sexual Activity  Drug Use Not Currently   Types: Marijuana    Social History   Socioeconomic History   Marital status: Single    Spouse name: Not on file   Number of children: 1   Years of education: Not on file   Highest education level: Not on file  Occupational History   Not on file  Tobacco Use   Smoking status: Never   Smokeless tobacco: Never  Vaping Use   Vaping Use: Some days  Substance and Sexual Activity   Alcohol use: Not Currently   Drug use: Not Currently    Types: Marijuana   Sexual activity: Yes    Partners: Male    Birth control/protection: None  Other Topics Concern   Not on file  Social History Narrative   Not on file   Social Determinants of Health   Financial Resource Strain: Not on file  Food Insecurity: No Food Insecurity (03/03/2022)   Hunger Vital Sign    Worried About Running Out of Food in the Last Year: Never true    Ran Out of Food in the Last Year: Never true  Transportation Needs: No Transportation Needs (03/03/2022)   PRAPARE - Transportation    Lack of Transportation (Medical): No    Lack of  Transportation (Non-Medical): No  Physical Activity: Not on file  Stress: Not on file  Social Connections: Not on file   Additional Social History:                         Sleep: Fair  Appetite:  Fair  Current Medications: Current Facility-Administered Medications  Medication Dose Route Frequency Provider Last Rate Last Admin   acetaminophen (TYLENOL) tablet 650 mg  650 mg Oral Q6H PRN Shifa Brisbon T, MD   650 mg at 03/04/22 1609   alum & mag hydroxide-simeth (MAALOX/MYLANTA) 200-200-20 MG/5ML suspension 30 mL  30 mL Oral Q4H PRN Cody Albus, [name], MD       hydrOXYzine (ATARAX) tablet 50 mg  50 mg Oral Q6H PRN Dahiana Kulak T, MD   50 mg at 03/07/22 1727   magnesium hydroxide (MILK OF MAGNESIA) suspension 30 mL  30 mL Oral  Daily PRN Eh Sauseda T, MD       mirtazapine (REMERON) tablet 45 mg  45 mg Oral QHS Cherylanne Ardelean T, MD   45 mg at 03/07/22 2115   ondansetron (ZOFRAN-ODT) disintegrating tablet 4 mg  4 mg Oral Q6H PRN [name], CNM   4 mg at 03/08/22 0802   prenatal multivitamin tablet 1 tablet  1 tablet Oral Daily Jensyn Cambria T, MD       pyridOXINE (VITAMIN B6) tablet 25 mg  25 mg Oral TID McVey, Rebecca A, CNM   25 mg at 03/08/22 0802   QUEtiapine (SEROQUEL) tablet 50 mg  50 mg Oral QHS PRN Kimsey Demaree, [name], MD        Lab Results: No results found for this or any previous visit (from the past 48 hour(s)).  Blood Alcohol level:  Lab Results  Component Value Date   ETH <10 76/16/0737    Metabolic Disorder Labs: Lab Results  Component Value Date   HGBA1C 5.5 12/12/2020   No results found for: "PROLACTIN" No results found for: "CHOL", "TRIG", "HDL", "CHOLHDL", "VLDL", "LDLCALC"  Physical Findings: AIMS:  , ,  ,  ,    CIWA:    COWS:     Musculoskeletal: Strength & Muscle Tone: within normal limits Gait & Station: normal Patient leans: N/A  Psychiatric Specialty Exam:  Presentation  General Appearance: No data recorded Eye Contact:No data recorded Speech:No data recorded Speech Volume:No data recorded Handedness:No data recorded  Mood and Affect  Mood:No data recorded Affect:No data recorded  Thought Process  Thought Processes:No data recorded Descriptions of Associations:No data recorded Orientation:No data recorded Thought Content:No data recorded History of Schizophrenia/Schizoaffective disorder:No  Duration of Psychotic Symptoms:No data recorded Hallucinations:No data recorded Ideas of Reference:No data recorded Suicidal Thoughts:No data recorded Homicidal Thoughts:No data recorded  Sensorium  Memory:No data recorded Judgment:No data recorded Insight:No data recorded  Executive Functions  Concentration:No data recorded Attention Span:No  data recorded Recall:No data recorded Fund of Knowledge:No data recorded Language:No data recorded  Psychomotor Activity  Psychomotor Activity:No data recorded  Assets  Assets:No data recorded  Sleep  Sleep:No data recorded   Physical Exam: Physical Exam Vitals and nursing note reviewed.  Constitutional:      Appearance: Normal appearance.  HENT:     Head: Normocephalic and atraumatic.     Mouth/Throat:     Pharynx: Oropharynx is clear.  Eyes:     Pupils: Pupils are equal, round, and reactive to light.  Cardiovascular:     Rate and Rhythm: Normal rate  and regular rhythm.  Pulmonary:     Effort: Pulmonary effort is normal.     Breath sounds: Normal breath sounds.  Abdominal:     General: Abdomen is flat.     Palpations: Abdomen is soft.  Musculoskeletal:        General: Normal range of motion.  Skin:    General: Skin is warm and dry.  Neurological:     General: No focal deficit present.     Mental Status: She is alert. Mental status is at baseline.  Psychiatric:        Attention and Perception: Attention normal.        Mood and Affect: Mood normal.        Speech: Speech normal.        Behavior: Behavior is cooperative.        Thought Content: Thought content normal.        Cognition and Memory: Cognition normal.    Review of Systems  Constitutional: Negative.   HENT: Negative.    Eyes: Negative.   Respiratory: Negative.    Cardiovascular: Negative.   Gastrointestinal: Negative.   Musculoskeletal: Negative.   Skin: Negative.   Neurological: Negative.   Psychiatric/Behavioral: Negative.     Blood pressure 135/84, pulse 98, temperature 98.8 F (37.1 C), temperature source Oral, resp. rate 16, height 5\' 2"  (1.575 m), weight 77.1 kg, last menstrual period 11/16/2021, SpO2 99 %, not currently breastfeeding. Body mass index is 31.09 kg/m.   Treatment Plan Summary: Medication management and Plan patient appears to be stabilizing.  To me it seems obvious that  for a woman who is just recently in a psychiatric hospital and attempted suicide quite seriously soon after discharge , and who furthermore is pregnant, that 6 days in the hospital in order to change medications and have some confidence of stability seems appropriate.  Patient will still have a significant risk of suicidal behavior at discharge but I think that we can feel reasonably confident about a plan for discharge Monday.  Monday, MD 03/08/2022, 12:30 PM

## 2022-03-09 NOTE — Plan of Care (Signed)
D- Patient alert and oriented. Patient presents in a pleasant mood on assessment reporting that she slept good last night and had some complaints of nausea, in which she did request PRN medication for relief. Patient denies SI, HI, AVH, and pain at this time. Patient also denies signs/symptoms of depression and anxiety. Patient had no stated goals for today.  A- Scheduled medications administered to patient, per MD orders. Support and encouragement provided.  Routine safety checks conducted every 15 minutes.  Patient informed to notify staff with problems or concerns.  R- No adverse drug reactions noted. Patient contracts for safety at this time. Patient compliant with medications and treatment plan. Patient receptive, calm, and cooperative. Patient interacts wePatient remains safe at this time.  Problem: Education: Goal: Knowledge of [place] [facility] Education information/materials will improve Outcome: Progressing Goal: Emotional status will improve Outcome: Progressing Goal: Mental status will improve Outcome: Progressing Goal: Verbalization of understanding the information provided will improve Outcome: Progressing   Problem: Health Behavior/Discharge Planning: Goal: Identification of resources available to assist in meeting health care needs will improve Outcome: Progressing Goal: Compliance with treatment plan for underlying cause of condition will improve Outcome: Progressing   Problem: Education: Goal: Utilization of techniques to improve thought processes will improve Outcome: Progressing Goal: Knowledge of the prescribed therapeutic regimen will improve Outcome: Progressing   Problem: Activity: Goal: Interest or engagement in leisure activities will improve Outcome: Progressing Goal: Imbalance in normal sleep/wake cycle will improve Outcome: Progressing   Problem: Coping: Goal: Coping ability will improve Outcome: Progressing Goal: Will verbalize feelings Outcome:  Progressing   Problem: Self-Concept: Goal: Will verbalize positive feelings about self Outcome: Progressing Goal: Level of anxiety will decrease Outcome: Progressing

## 2022-03-09 NOTE — Progress Notes (Signed)
RN auscultated regular FHR with doppler- FHR regular. FHT 152. Pt reports no OB concerns at this time.

## 2022-03-09 NOTE — Progress Notes (Signed)
Patient refused scheduled prenatal vitamin. Patient states she only takes the gummies. MD will be made aware during progression rounds.

## 2022-03-09 NOTE — Progress Notes (Signed)
[facility] MD Progress Note  03/09/2022 1:39 PM Victoria Garrett  MRN:  [id number] Subjective: Follow-up patient with depression.  No new complaints.  Mood stated as good.  Denies suicidal ideation Principal Problem: Severe recurrent major depression without psychotic features (HCC) Diagnosis: Principal Problem:   Severe recurrent major depression without psychotic features (HCC) Active Problems:   Pregnancy  Total Time spent with patient: 30 minutes  Past Psychiatric History: Past history of depression and anxiety  Past Medical History:  Past Medical History:  Diagnosis Date   Anemia    Benign gestational thrombocytopenia in third trimester (HCC) 02/28/2020   Plts 135 at new OB, then 127 at 28 weeks Check platelets at 36 week visit [ ]     Heart murmur    as a child    Preterm uterine contractions in third trimester, antepartum 02/28/2020   Supervision of other normal pregnancy, antepartum 02/13/2020    Nursing Staff Provider Office Location [facility]  Dating   6w 5d 02/15/2020 Language  ENGLISH Anatomy US   WNL visible in Care everywhere 10/2019 Flu Vaccine  02/14/2020 Genetic Screen  NIPS:   AFP:   First Screen:  Quad:   TDaP Vaccine   02/14/2020 Hgb A1C or  GTT Normal 1 hour 01/04/2020 per [facility] COVID Vaccine NOT VACCINATED   LAB RESULTS  Rhogam   Blood Type    B POS Feeding Plan BREASTFEED A   UTI in pregnancy 03/02/2020   Vs bacteriuria. Treated 1/14. [ ]  TOC    Past Surgical History:  Procedure Laterality Date   IR NEPHROURETERAL CATH PLACE RIGHT Right 10/2015   NEPHROSTOMY TUBE PLACEMENT ([facility] HX)     Family History:  Family History  Problem Relation Age of Onset   Healthy Mother    Healthy Father    Asthma Sister    Diabetes Maternal Grandmother    Family Psychiatric  History: See previous Social History:  Social History   Substance and Sexual Activity  Alcohol Use Not Currently     Social History   Substance and Sexual Activity  Drug Use Not Currently   Types:  Marijuana    Social History   Socioeconomic History   Marital status: Single    Spouse name: Not on file   Number of children: 1   Years of education: Not on file   Highest education level: Not on file  Occupational History   Not on file  Tobacco Use   Smoking status: Never   Smokeless tobacco: Never  Vaping Use   Vaping Use: Some days  Substance and Sexual Activity   Alcohol use: Not Currently   Drug use: Not Currently    Types: Marijuana   Sexual activity: Yes    Partners: Male    Birth control/protection: None  Other Topics Concern   Not on file  Social History Narrative   Not on file   Social Determinants of Health   Financial Resource Strain: Not on file  Food Insecurity: No Food Insecurity (03/03/2022)   Hunger Vital Sign    Worried About Running Out of Food in the Last Year: Never true    Ran Out of Food in the Last Year: Never true  Transportation Needs: No Transportation Needs (03/03/2022)   PRAPARE - 03/05/2022 (Medical): No    Lack of Transportation (Non-Medical): No  Physical Activity: Not on file  Stress: Not on file  Social Connections: Not on file   Additional Social History:  Sleep: Fair  Appetite:  Fair  Current Medications: Current Facility-Administered Medications  Medication Dose Route Frequency Provider Last Rate Last Admin   acetaminophen (TYLENOL) tablet 650 mg  650 mg Oral Q6H PRN Delmar Dondero T, MD   650 mg at 03/04/22 1609   alum & mag hydroxide-simeth (MAALOX/MYLANTA) 200-200-20 MG/5ML suspension 30 mL  30 mL Oral Q4H PRN Geniece Akers, [name], MD       hydrOXYzine (ATARAX) tablet 50 mg  50 mg Oral Q6H PRN Deni Lefever T, MD   50 mg at 03/07/22 1727   magnesium hydroxide (MILK OF MAGNESIA) suspension 30 mL  30 mL Oral Daily PRN Easter Kennebrew T, MD       mirtazapine (REMERON) tablet 45 mg  45 mg Oral QHS Karmelo Bass T, MD   45 mg at 03/08/22 2104   ondansetron (ZOFRAN-ODT)  disintegrating tablet 4 mg  4 mg Oral Q6H PRN [name], CNM   4 mg at 03/09/22 1059   prenatal multivitamin tablet 1 tablet  1 tablet Oral Daily Abdurahman Rugg T, MD       pyridOXINE (VITAMIN B6) tablet 25 mg  25 mg Oral TID McVey, Rebecca A, CNM   25 mg at 03/09/22 1100   QUEtiapine (SEROQUEL) tablet 50 mg  50 mg Oral QHS PRN Aarian Griffie, [name], MD        Lab Results: No results found for this or any previous visit (from the past 48 hour(s)).  Blood Alcohol level:  Lab Results  Component Value Date   ETH <10 01/75/1025    Metabolic Disorder Labs: Lab Results  Component Value Date   HGBA1C 5.5 12/12/2020   No results found for: "PROLACTIN" No results found for: "CHOL", "TRIG", "HDL", "CHOLHDL", "VLDL", "LDLCALC"  Physical Findings: AIMS:  , ,  ,  ,    CIWA:    COWS:     Musculoskeletal: Strength & Muscle Tone: within normal limits Gait & Station: normal Patient leans: N/A  Psychiatric Specialty Exam:  Presentation  General Appearance: No data recorded Eye Contact:No data recorded Speech:No data recorded Speech Volume:No data recorded Handedness:No data recorded  Mood and Affect  Mood:No data recorded Affect:No data recorded  Thought Process  Thought Processes:No data recorded Descriptions of Associations:No data recorded Orientation:No data recorded Thought Content:No data recorded History of Schizophrenia/Schizoaffective disorder:No  Duration of Psychotic Symptoms:No data recorded Hallucinations:No data recorded Ideas of Reference:No data recorded Suicidal Thoughts:No data recorded Homicidal Thoughts:No data recorded  Sensorium  Memory:No data recorded Judgment:No data recorded Insight:No data recorded  Executive Functions  Concentration:No data recorded Attention Span:No data recorded Recall:No data recorded Fund of Knowledge:No data recorded Language:No data recorded  Psychomotor Activity  Psychomotor Activity:No data  recorded  Assets  Assets:No data recorded  Sleep  Sleep:No data recorded   Physical Exam: Physical Exam Vitals and nursing note reviewed.  Constitutional:      Appearance: Normal appearance.  HENT:     Head: Normocephalic and atraumatic.     Mouth/Throat:     Pharynx: Oropharynx is clear.  Eyes:     Pupils: Pupils are equal, round, and reactive to light.  Cardiovascular:     Rate and Rhythm: Normal rate and regular rhythm.  Pulmonary:     Effort: Pulmonary effort is normal.     Breath sounds: Normal breath sounds.  Abdominal:     General: Abdomen is flat.     Palpations: Abdomen is soft.  Musculoskeletal:  General: Normal range of motion.  Skin:    General: Skin is warm and dry.  Neurological:     General: No focal deficit present.     Mental Status: She is alert. Mental status is at baseline.  Psychiatric:        Attention and Perception: Attention normal.        Mood and Affect: Mood normal.        Speech: Speech normal.        Behavior: Behavior normal.        Thought Content: Thought content normal.        Cognition and Memory: Cognition normal.    Review of Systems  Constitutional: Negative.   HENT: Negative.    Eyes: Negative.   Respiratory: Negative.    Cardiovascular: Negative.   Gastrointestinal: Negative.   Musculoskeletal: Negative.   Skin: Negative.   Neurological: Negative.   Psychiatric/Behavioral: Negative.     Blood pressure 131/82, pulse 92, temperature 98.7 F (37.1 C), temperature source Oral, resp. rate 16, height 5\' 2"  (1.575 m), weight 77.1 kg, last menstrual period 11/16/2021, SpO2 100 %, not currently breastfeeding. Body mass index is 31.09 kg/m.   Treatment Plan Summary: Plan patient appears to be improving.  Tolerating medicine well.  We are on track for probable discharge tomorrow  Alethia Berthold, MD 03/09/2022, 1:39 PM

## 2022-03-09 NOTE — BHH Group Notes (Signed)
[facility] Group Notes:  (Nursing/MHT/Case Management/Adjunct)  Date:  03/09/2022  Time:  11:15 AM  Type of Therapy:   community meeting  Participation Level:  Minimal  Participation Quality:  Appropriate  Affect:  Appropriate  Cognitive:  Appropriate  Insight:  Limited  Engagement in Group:  Limited  Modes of Intervention:  Discussion and Education  Summary of Progress/Problems:  Victoria Garrett 03/09/2022, 11:15 AM

## 2022-03-09 NOTE — Progress Notes (Signed)
Received Zofran earlier prior to eating snack to reduce the chance of nausea. Received her qhs meds.  Denies anxiety and depression.  Denies  si  hi  avh  has some worries regarding home life.  Her son has not been to school since she has been in the hospital and now CPS may be involved as a result.  Other than that she had no issues or concerns that needed to be addressed.  Q 15 minute safety checks in place.      C Butler-Nicholson, LPN

## 2022-03-10 ENCOUNTER — Other Ambulatory Visit: Payer: Self-pay

## 2022-03-10 NOTE — Discharge Summary (Signed)
Physician Discharge Summary Note  Patient:  Victoria Garrett is an 22 y.o., female MRN:  [id number] DOB:  [birth year] Patient phone:  [phone] (home)  Patient address:   [place] [id number],  Total Time spent with patient: 30 minutes  Date of Admission:  03/03/2022 Date of Discharge: 03/10/2022  Reason for Admission: Patient was admitted to the hospital because of suicide attempt soon after a recent discharge with ongoing depression  Principal Problem: Severe recurrent major depression without psychotic features [facility]) Discharge Diagnoses: Principal Problem:   Severe recurrent major depression without psychotic features ([place]) Active Problems:   Pregnancy   Past Psychiatric History: Past history of recurrent depression often related with pregnancy.  Recent suicide attempt.  2 hospitalizations in a row.  Past Medical History:  Past Medical History:  Diagnosis Date   Anemia    Benign gestational thrombocytopenia in third trimester ([place]) 02/28/2020   Plts 135 at new OB, then 127 at 28 weeks Check platelets at 36 week visit [ ]     Heart murmur    as a child    Preterm uterine contractions in third trimester, antepartum 02/28/2020   Supervision of other normal pregnancy, antepartum 02/13/2020    Nursing Staff Provider Office Location [place]  Dating   6w 5d [place] Language  ENGLISH Anatomy US   WNL visible in Care everywhere 10/2019 Flu Vaccine  02/14/2020 Genetic Screen  NIPS:   AFP:   First Screen:  Quad:   TDaP Vaccine   02/14/2020 Hgb A1C or  GTT Normal 1 hour 01/04/2020 per [place] Vaccine NOT VACCINATED   LAB RESULTS  Rhogam   Blood Type    B POS Feeding Plan BREASTFEED A   UTI in pregnancy 03/02/2020   Vs bacteriuria. Treated 1/14. [ ]  TOC    Past Surgical History:  Procedure Laterality Date   IR NEPHROURETERAL CATH PLACE RIGHT Right 10/2015   NEPHROSTOMY TUBE PLACEMENT ([place] HX)     Family History:  Family History  Problem  Relation Age of Onset   Healthy Mother    Healthy Father    Asthma Sister    Diabetes Maternal Grandmother    Family Psychiatric  History: See previous Social History:  Social History   Substance and Sexual Activity  Alcohol Use Not Currently     Social History   Substance and Sexual Activity  Drug Use Not Currently   Types: Marijuana    Social History   Socioeconomic History   Marital status: Single    Spouse name: Not on file   Number of children: 1   Years of education: Not on file   Highest education level: Not on file  Occupational History   Not on file  Tobacco Use   Smoking status: Never   Smokeless tobacco: Never  Vaping Use   Vaping Use: Some days  Substance and Sexual Activity   Alcohol use: Not Currently   Drug use: Not Currently    Types: Marijuana   Sexual activity: Yes    Partners: Male    Birth control/protection: None  Other Topics Concern   Not on file  Social History Narrative   Not on file   Social Determinants of Health   Financial Resource Strain: Not on file  Food Insecurity: No Food Insecurity (03/03/2022)   Hunger Vital Sign    Worried About Running Out of Food in the Last Year: Never true    Ran Out of Food in  the Last Year: Never true  Transportation Needs: No Transportation Needs (03/03/2022)   PRAPARE - Hydrologist (Medical): No    Lack of Transportation (Non-Medical): No  Physical Activity: Not on file  Stress: Not on file  Social Connections: Not on file    Hospital Course: Patient was admitted to psychiatric hospital.  No behavior problems while admitted.  Did not display any dangerous aggressive violent or suicidal behavior.  Had anxiety and depression on first admission.  Medicines were altered to mirtazapine and olanzapine.  Doses have been gradually titrated up.  Patient is now on a therapeutic dose of mirtazapine with good tolerance.  Psychoeducation about the importance of mental health  follow-up medicine compliance and of working on stress management to avoid impulsive actions at discharge.  Currently no sign of acute dangerousness she is discharged home.  Physical Findings: AIMS:  , ,  ,  ,    CIWA:    COWS:     Musculoskeletal: Strength & Muscle Tone: within normal limits Gait & Station: normal Patient leans: N/A   Psychiatric Specialty Exam:  Presentation  General Appearance: No data recorded Eye Contact:No data recorded Speech:No data recorded Speech Volume:No data recorded Handedness:No data recorded  Mood and Affect  Mood:No data recorded Affect:No data recorded  Thought Process  Thought Processes:No data recorded Descriptions of Associations:No data recorded Orientation:No data recorded Thought Content:No data recorded History of Schizophrenia/Schizoaffective disorder:No  Duration of Psychotic Symptoms:No data recorded Hallucinations:No data recorded Ideas of Reference:No data recorded Suicidal Thoughts:No data recorded Homicidal Thoughts:No data recorded  Sensorium  Memory:No data recorded Judgment:No data recorded Insight:No data recorded  Executive Functions  Concentration:No data recorded Attention Span:No data recorded Recall:No data recorded Fund of Knowledge:No data recorded Language:No data recorded  Psychomotor Activity  Psychomotor Activity:No data recorded  Assets  Assets:No data recorded  Sleep  Sleep:No data recorded   Physical Exam: Physical Exam Vitals and nursing note reviewed.  Constitutional:      Appearance: Normal appearance.  HENT:     Head: Normocephalic and atraumatic.     Mouth/Throat:     Pharynx: Oropharynx is clear.  Eyes:     Pupils: Pupils are equal, round, and reactive to light.  Cardiovascular:     Rate and Rhythm: Normal rate and regular rhythm.  Pulmonary:     Effort: Pulmonary effort is normal.     Breath sounds: Normal breath sounds.  Abdominal:     General: Abdomen is flat.      Palpations: Abdomen is soft.    Musculoskeletal:        General: Normal range of motion.  Skin:    General: Skin is warm and dry.  Neurological:     General: No focal deficit present.     Mental Status: She is alert. Mental status is at baseline.  Psychiatric:        Attention and Perception: Attention normal.        Mood and Affect: Mood normal.        Speech: Speech normal.        Behavior: Behavior normal.        Thought Content: Thought content normal.        Cognition and Memory: Cognition normal.        Judgment: Judgment normal.    Review of Systems  Constitutional: Negative.   HENT: Negative.    Eyes: Negative.   Respiratory: Negative.    Cardiovascular: Negative.   Gastrointestinal:  Negative.   Musculoskeletal: Negative.   Skin: Negative.   Neurological: Negative.   Psychiatric/Behavioral: Negative.     Blood pressure 131/85, pulse 86, temperature 98.6 F (37 C), temperature source Oral, resp. rate 16, height 5\' 2"  (1.575 m), weight 77.1 kg, last menstrual period 11/16/2021, SpO2 100 %, not currently breastfeeding. Body mass index is 31.09 kg/m.   Social History   Tobacco Use  Smoking Status Never  Smokeless Tobacco Never   Tobacco Cessation:  N/A, patient does not currently use tobacco products   Blood Alcohol level:  Lab Results  Component Value Date   ETH <10 03/01/2022    Metabolic Disorder Labs:  Lab Results  Component Value Date   HGBA1C 5.5 12/12/2020   No results found for: "PROLACTIN" No results found for: "CHOL", "TRIG", "HDL", "CHOLHDL", "VLDL", "LDLCALC"  See Psychiatric Specialty Exam and Suicide Risk Assessment completed by Attending Physician prior to discharge.  Discharge destination:  Home  Is patient on multiple antipsychotic therapies at discharge:  No   Has Patient had three or more failed trials of antipsychotic monotherapy by history:  No  Recommended Plan for Multiple Antipsychotic Therapies: NA  Discharge  Instructions     Diet - low sodium heart healthy   Complete by: As directed    Increase activity slowly   Complete by: As directed       Allergies as of 03/10/2022   No Known Allergies      Medication List     STOP taking these medications    metroNIDAZOLE 0.75 % vaginal gel Commonly known as: METROGEL   promethazine 25 MG tablet Commonly known as: PHENERGAN   Vitafol Gummies 3.33-0.333-34.8 MG Chew Replaced by: Prenatal 27-1 MG Tabs       TAKE these medications      Indication  hydrOXYzine 50 MG tablet Commonly known as: ATARAX Take 1 tablet (50 mg total) by mouth every 6 (six) hours as needed for anxiety.  Indication: Feeling Anxious   mirtazapine 45 MG tablet Commonly known as: REMERON Take 1 tablet (45 mg total) by mouth at bedtime. What changed:  medication strength how much to take  Indication: Major Depressive Disorder   ondansetron 4 MG tablet Commonly known as: Zofran Take 1 tablet (4 mg total) by mouth every 6 (six) hours as needed.  Indication: Nausea and Vomiting in Pregnancy   Prenatal 27-1 MG Tabs Take 1 tablet by mouth daily. Replaces: Vitafol Gummies 3.33-0.333-34.8 MG Chew  Indication: Pregnancy   pyridOXINE 25 MG tablet Commonly known as: VITAMIN B6 Take 1 tablet (25 mg total) by mouth 3 (three) times daily.  Indication: Nausea and Vomiting in Pregnancy   QUEtiapine 50 MG tablet Commonly known as: SEROQUEL Take 1 tablet (50 mg total) by mouth at bedtime as needed (sleep).  Indication: Major Depressive Disorder, Sleep         Follow-up recommendations:  Other:  Patient was seen by obstetrics on first admission.  No change to any treatment recommendations for the pregnancy.  Continue prenatal vitamins and recommended treatments for hyperemesis.  Follow-up with outpatient provider.  Prescriptions provided for psychiatric medicines referral made for follow-up to continue with psychiatric treatment.  Comments: Prescription and  supply provided  Signed: 04-01-1980, MD 03/10/2022, 9:42 AM

## 2022-03-10 NOTE — Plan of Care (Signed)
  Problem: Education: Goal: Knowledge of [place] Education information/materials will improve Outcome: Progressing Goal: Emotional status will improve Outcome: Progressing Goal: Mental status will improve Outcome: Progressing Goal: Verbalization of understanding the information provided will improve Outcome: Progressing   Problem: Health Behavior/Discharge Planning: Goal: Identification of resources available to assist in meeting health care needs will improve Outcome: Progressing Goal: Compliance with treatment plan for underlying cause of condition will improve Outcome: Progressing   Problem: Self-Concept: Goal: Will verbalize positive feelings about self Outcome: Progressing Goal: Level of anxiety will decrease Outcome: Progressing   Problem: Coping: Goal: Coping ability will improve Outcome: Progressing Goal: Will verbalize feelings Outcome: Progressing   Problem: Activity: Goal: Interest or engagement in leisure activities will improve Outcome: Progressing Goal: Imbalance in normal sleep/wake cycle will improve Outcome: Progressing

## 2022-03-10 NOTE — Progress Notes (Signed)
Patient pleasant and cooperative on approach. Denies SI,HI and AVH. Verbalized understanding discharge instructions and follow up care. 7 days medicines given to patient. No belongings at [facility] locker to return. Patient escorted out by staff and transported by family.

## 2022-03-10 NOTE — Progress Notes (Addendum)
Recreation Therapy Notes   Date: 03/10/2022  Time: 10:30 am     Location: Craft room   Behavioral response: Appropriate  Intervention Topic: Time-Management   Discussion/Intervention:  Group content today was focused on time management. The group defined time management and identified healthy ways to manage time. Individuals expressed how much of the 24 hours they use in a day. Patients expressed how much time they use just for themselves personally. The group expressed how they have managed their time in the past. Individuals participated in the intervention "Managing Life" where they had a chance to see how much of the 24 hours they use and where it goes. Clinical Observations/Feedback: Patient came to group and defined time management as a scheduled routine. She stated that she manages her time by setting an alarm and try to get things done. Individual was social with staff and peers while participating in the intervention.  Kallen Delatorre LRT/CTRS       Fany Cavanaugh 03/10/2022 1:34 PM

## 2022-03-10 NOTE — BH IP Treatment Plan (Signed)
Interdisciplinary Treatment and Diagnostic Plan Update  03/10/2022 Time of Session: 8:30AM Victoria Garrett MRN: [id number]  Principal Diagnosis: Severe recurrent major depression without psychotic features [facility])  Secondary Diagnoses: Principal Problem:   Severe recurrent major depression without psychotic features ([place]) Active Problems:   Pregnancy   Current Medications:  Current Facility-Administered Medications  Medication Dose Route Frequency Provider Last Rate Last Admin   acetaminophen (TYLENOL) tablet 650 mg  650 mg Oral Q6H PRN Clapacs, John T, MD   650 mg at 03/04/22 1609   alum & mag hydroxide-simeth (MAALOX/MYLANTA) 200-200-20 MG/5ML suspension 30 mL  30 mL Oral Q4H PRN Clapacs, [name], MD       hydrOXYzine (ATARAX) tablet 50 mg  50 mg Oral Q6H PRN Clapacs, John T, MD   50 mg at 03/09/22 1817   magnesium hydroxide (MILK OF MAGNESIA) suspension 30 mL  30 mL Oral Daily PRN Clapacs, John T, MD       mirtazapine (REMERON) tablet 45 mg  45 mg Oral QHS Clapacs, John T, MD   45 mg at 03/09/22 2057   ondansetron (ZOFRAN-ODT) disintegrating tablet 4 mg  4 mg Oral Q6H PRN [name], CNM   4 mg at 03/09/22 1944   prenatal multivitamin tablet 1 tablet  1 tablet Oral Daily Clapacs, John T, MD       pyridOXINE (VITAMIN B6) tablet 25 mg  25 mg Oral TID McVey, Rebecca A, CNM   25 mg at 03/10/22 0819   QUEtiapine (SEROQUEL) tablet 50 mg  50 mg Oral QHS PRN Clapacs, [name], MD       PTA Medications: Medications Prior to Admission  Medication Sig Dispense Refill Last Dose   mirtazapine (REMERON) 15 MG tablet Take 15 mg by mouth at bedtime.   Past Week   Prenatal Vit-Fe Phos-FA-Omega (VITAFOL GUMMIES) 3.33-0.333-34.8 MG CHEW Chew 1 tablet by mouth daily. 90 tablet 5 Past Week   metroNIDAZOLE (METROGEL) 0.75 % vaginal gel Place 1 Applicatorful vaginally at bedtime. Apply one applicatorful to vagina at bedtime for 5 days (Patient not taking: Reported on 01/20/2022) 70 g 1  Not Taking   promethazine (PHENERGAN) 25 MG tablet Take 1 tablet (25 mg total) by mouth every 6 (six) hours as needed for nausea or vomiting. (Patient not taking: Reported on 03/04/2022) 30 tablet 1 Not Taking    Patient Stressors: Health problems   Medication change or noncompliance    Patient Strengths: Capable of independent living  Communication skills  Supportive family/friends   Treatment Modalities: Medication Management, Group therapy, Case management,  1 to 1 session with clinician, Psychoeducation, Recreational therapy.   Physician Treatment Plan for Primary Diagnosis: Severe recurrent major depression without psychotic features ([place]) Long Term Goal(s): Improvement in symptoms so as ready for discharge   Short Term Goals: Ability to identify and develop effective coping behaviors will improve Ability to maintain clinical measurements within normal limits will improve Compliance with prescribed medications will improve Ability to verbalize feelings will improve Ability to disclose and discuss suicidal ideas Ability to demonstrate self-control will improve  Medication Management: Evaluate patient's response, side effects, and tolerance of medication regimen.  Therapeutic Interventions: 1 to 1 sessions, Unit Group sessions and Medication administration.  Evaluation of Outcomes: Adequate for Discharge  Physician Treatment Plan for Secondary Diagnosis: Principal Problem:   Severe recurrent major depression without psychotic features [facility]) Active Problems:   Pregnancy  Long Term Goal(s): Improvement in symptoms so as ready for discharge   Short  Term Goals: Ability to identify and develop effective coping behaviors will improve Ability to maintain clinical measurements within normal limits will improve Compliance with prescribed medications will improve Ability to verbalize feelings will improve Ability to disclose and discuss suicidal ideas Ability to demonstrate  self-control will improve     Medication Management: Evaluate patient's response, side effects, and tolerance of medication regimen.  Therapeutic Interventions: 1 to 1 sessions, Unit Group sessions and Medication administration.  Evaluation of Outcomes: Adequate for Discharge   RN Treatment Plan for Primary Diagnosis: Severe recurrent major depression without psychotic features ([name]) Long Term Goal(s): Knowledge of disease and therapeutic regimen to maintain health will improve  Short Term Goals: Ability to demonstrate self-control, Ability to participate in decision making will improve, Ability to verbalize feelings will improve, Ability to disclose and discuss suicidal ideas, Ability to identify and develop effective coping behaviors will improve, and Compliance with prescribed medications will improve  Medication Management: RN will administer medications as ordered by provider, will assess and evaluate patient's response and provide education to patient for prescribed medication. RN will report any adverse and/or side effects to prescribing provider.  Therapeutic Interventions: 1 on 1 counseling sessions, Psychoeducation, Medication administration, Evaluate responses to treatment, Monitor vital signs and CBGs as ordered, Perform/monitor CIWA, COWS, AIMS and Fall Risk screenings as ordered, Perform wound care treatments as ordered.  Evaluation of Outcomes: Adequate for Discharge   LCSW Treatment Plan for Primary Diagnosis: Severe recurrent major depression without psychotic features ([name]) Long Term Goal(s): Safe transition to appropriate next level of care at discharge, Engage patient in therapeutic group addressing interpersonal concerns.  Short Term Goals: Engage patient in aftercare planning with referrals and resources, Increase social support, Increase ability to appropriately verbalize feelings, Increase emotional regulation, Facilitate acceptance of mental health diagnosis and  concerns, and Increase skills for wellness and recovery  Therapeutic Interventions: Assess for all discharge needs, 1 to 1 time with Social worker, Explore available resources and support systems, Assess for adequacy in community support network, Educate family and significant other(s) on suicide prevention, Complete Psychosocial Assessment, Interpersonal group therapy.  Evaluation of Outcomes: Adequate for Discharge   Progress in Treatment: Attending groups: Yes. Participating in groups: No. Taking medication as prescribed: Yes. Toleration medication: Yes. Family/Significant other contact made: Yes, individual(s) contacted:  SPE completed with the patient's partner. Patient understands diagnosis: Yes. Discussing patient identified problems/goals with staff: Yes. Medical problems stabilized or resolved: Yes. Denies suicidal/homicidal ideation: Yes. Issues/concerns per patient self-inventory: No. Other: none  New problem(s) identified: No, Describe:  none  Update: 03/10/2022:  No changes at this time.   New Short Term/Long Term Goal(s): Patient to work towards medication management for mood stabilization; elimination of SI thoughts; development of comprehensive mental wellness plan.  Update: 03/10/2022:  No changes at this time.   Patient Goals:  Patient states their goal for treatment is to "(get) some coping skills." Update: 03/10/2022:  No changes at this time.   Discharge Plan or Barriers: No psychosocial barriers identified at this time, patient to return to place of residence when appropriate for discharge.  Update: 03/10/2022:  Patient has a current mental health provider and plans are to reschedule a missed appointment. Patient reports plans to return to her home.   Reason for Continuation of Hospitalization: Depression Medication stabilization Suicidal ideation   Estimated Length of Stay: 1-7 days  Update: 03/10/2022:  TBD    Last 3 [place] Suicide Severity Risk  Score: Flowsheet Row Admission (Current) from 03/03/2022  in [facility] INPATIENT [facility] MEDICINE ED from 03/01/2022 in [facility] Emergency Department at [facility] Admission (Discharged) from 12/25/2021 in [facility] 1S Maternity Assessment Unit  C-SSRS RISK CATEGORY No Risk High Risk No Risk       Last PHQ 2/9 Scores:    01/30/2022   10:06 AM 05/28/2020    9:21 AM 02/14/2020   11:38 AM  Depression screen PHQ 2/9  Decreased Interest 1 2 0  Down, Depressed, Hopeless 1 1 0  PHQ - 2 Score 2 3 0  Altered sleeping 2 2 2   Tired, decreased energy 2 2 2   Change in appetite 2 2 2   Feeling bad or failure about yourself  0 1 0  Trouble concentrating 1 1 2   Moving slowly or fidgety/restless 1 2 0  Suicidal thoughts 0 0 0  PHQ-9 Score 10 13 8   Difficult doing work/chores   Somewhat difficult    Scribe for Treatment Team: , LCSW 03/10/2022 8:52 AM

## 2022-03-10 NOTE — BHH Counselor (Signed)
CSW met with pt to discuss discharge. She stated plans to return home and shared that her boyfriend would be providing transportation for her home. Pt endorsed plans to continue outpatient services through [facility]. She has clothes to wear home. Pt denied any use of tobacco products or need for cessation. She also denied any use of substances or need for such services. Pt stated that she would need a note for her son's school. CSW informed pt that provider could  only write note for pt as she is the only one that was at the hospital. She agreed. Pt then inquired about HUD renewal. CSW encouraged pt to contact office directly and she stated that she did not know who to call. CSW stated that he would find the contact information for them. She agreed. No other concerns expressed. Contact ended without incident.   CSW notified provider of request for note via secure chat.   CSW contacted [facility] and scheduled follow up appointment.   Pt received information for the HUD Department in [place]. No other concerns expressed. Contact ended without incident.   Chalmers Guest. Guerry Bruin, MSW, LCSW, [place] 03/10/2022 2:35 PM

## 2022-03-10 NOTE — Progress Notes (Addendum)
  [facility] Adult Case Management Discharge Plan :  Will you be returning to the same living situation after discharge:  Yes,  pt plans to return home upon discharge. At discharge, do you have transportation home?: Yes,  pt boyfriend to provide transportation home. Do you have the ability to pay for your medications: Yes,  [place] [employer].  Release of information consent forms completed and in the chart;  Patient's signature needed at discharge.  Patient to Follow up at:  [facility]. Go to.   Why: Your appointment is scheduled for Thursday, 03/13/22 at 11am. Please complete new patient paperwork which is being emailed to [email or web address]. Complete this prior to initial appointment, which will be an in-person appointment. Thanks! Contact information: [address] [place]  [address] Phone: [phone] Fax: [phone]                Next level of care provider has access to Energy and Suicide Prevention discussed: Yes,  SPE completed with pt boyfriend.      Has patient been referred to the Quitline?: N/A patient is not a smoker  Patient has been referred for addiction treatment: N/A  Shirl Harris, LCSW 03/10/2022, 11:04 AM

## 2022-03-10 NOTE — BHH Suicide Risk Assessment (Signed)
[facility] Discharge Suicide Risk Assessment   Principal Problem: Severe recurrent major depression without psychotic features [facility]) Discharge Diagnoses: Principal Problem:   Severe recurrent major depression without psychotic features ([place]) Active Problems:   Pregnancy   Total Time spent with patient: 30 minutes  Musculoskeletal: Strength & Muscle Tone: within normal limits Gait & Station: normal Patient leans: N/A  Psychiatric Specialty Exam  Presentation  General Appearance: No data recorded Eye Contact:No data recorded Speech:No data recorded Speech Volume:No data recorded Handedness:No data recorded  Mood and Affect  Mood:No data recorded Duration of Depression Symptoms: Greater than two weeks  Affect:No data recorded  Thought Process  Thought Processes:No data recorded Descriptions of Associations:No data recorded Orientation:No data recorded Thought Content:No data recorded History of Schizophrenia/Schizoaffective disorder:No  Duration of Psychotic Symptoms:No data recorded Hallucinations:No data recorded Ideas of Reference:No data recorded Suicidal Thoughts:No data recorded Homicidal Thoughts:No data recorded  Sensorium  Memory:No data recorded Judgment:No data recorded Insight:No data recorded  Executive Functions  Concentration:No data recorded Attention Span:No data recorded Recall:No data recorded Fund of Knowledge:No data recorded Language:No data recorded  Psychomotor Activity  Psychomotor Activity:No data recorded  Assets  Assets:No data recorded  Sleep  Sleep:No data recorded  Physical Exam: Physical Exam Vitals and nursing note reviewed.  Constitutional:      Appearance: Normal appearance.  HENT:     Head: Normocephalic and atraumatic.     Mouth/Throat:     Pharynx: Oropharynx is clear.  Eyes:     Pupils: Pupils are equal, round, and reactive to light.  Cardiovascular:     Rate and Rhythm: Normal rate and regular rhythm.   Pulmonary:     Effort: Pulmonary effort is normal.     Breath sounds: Normal breath sounds.  Abdominal:     General: Abdomen is flat.     Palpations: Abdomen is soft.  Musculoskeletal:        General: Normal range of motion.  Skin:    General: Skin is warm and dry.  Neurological:     General: No focal deficit present.     Mental Status: She is alert. Mental status is at baseline.  Psychiatric:        Attention and Perception: Attention normal.        Mood and Affect: Mood normal.        Speech: Speech normal.        Behavior: Behavior normal.        Thought Content: Thought content normal.        Cognition and Memory: Cognition normal.        Judgment: Judgment normal.    Review of Systems  Constitutional: Negative.   HENT: Negative.    Eyes: Negative.   Respiratory: Negative.    Cardiovascular: Negative.   Gastrointestinal: Negative.   Musculoskeletal: Negative.   Skin: Negative.   Neurological: Negative.   Psychiatric/Behavioral: Negative.     Blood pressure 131/85, pulse 86, temperature 98.6 F (37 C), temperature source Oral, resp. rate 16, height 5\' 2"  (1.575 m), weight 77.1 kg, last menstrual period 11/16/2021, SpO2 100 %, not currently breastfeeding. Body mass index is 31.09 kg/m.  Mental Status Per Nursing Assessment::   On Admission:  NA  Demographic Factors:  NA  Loss Factors: Financial problems/change in socioeconomic status  Historical Factors: Prior suicide attempts and Impulsivity  Risk Reduction Factors:   Pregnancy, Responsible for children under 63 years of age, Sense of responsibility to family, Living with another person, especially a relative,  Positive social support, and Positive therapeutic relationship  Continued Clinical Symptoms:  Depression:   Impulsivity  Cognitive Features That Contribute To Risk:  None    Suicide Risk:  Minimal: No identifiable suicidal ideation.  Patients presenting with no risk factors but with morbid  ruminations; may be classified as minimal risk based on the severity of the depressive symptoms    Plan Of Care/Follow-up recommendations:  Other:  Patient denies depression and denies suicidal thoughts.  Her behavior in the hospital has been calm and appropriate and she is tolerating medicine well.  She is referred to appropriate outpatient treatment at discharge.  Encourage continued medicine management and follow-up with mental health care.  Patient continues to deny suicidal thought.  Alethia Berthold, MD 03/10/2022, 9:40 AM

## 2022-03-10 NOTE — Progress Notes (Signed)
Patient is pleasant and cooperative.  She has been on the phone since the start of the shift. Very tearful once she was off and came to get medications. She is med compliant and received her meds without issue.  She denies si/hi/avh. depression and anxiety at this encounter. She reports being able to go home tomorrow and is looking forward to discharge. Staff offered support and encouragement. Q 15 minute safety checks in place.    C Butler-Nicholson, LPN

## 2022-03-10 NOTE — Group Note (Signed)
[facility] LCSW Group Therapy Note    Group Date: 03/10/2022 Start Time: 1300 End Time: 1400  Type of Therapy and Topic:  Group Therapy:  Overcoming Obstacles  Participation Level:  BHH PARTICIPATION LEVEL: Did Not Attend  Mood:  Description of Group:   In this group patients will be encouraged to explore what they see as obstacles to their own wellness and recovery. They will be guided to discuss their thoughts, feelings, and behaviors related to these obstacles. The group will process together ways to cope with barriers, with attention given to specific choices patients can make. Each patient will be challenged to identify changes they are motivated to make in order to overcome their obstacles. This group will be process-oriented, with patients participating in exploration of their own experiences as well as giving and receiving support and challenge from other group members.  Therapeutic Goals: 1. Patient will identify personal and current obstacles as they relate to admission. 2. Patient will identify barriers that currently interfere with their wellness or overcoming obstacles.  3. Patient will identify feelings, thought process and behaviors related to these barriers. 4. Patient will identify two changes they are willing to make to overcome these obstacles:    Summary of Patient Progress   X   Therapeutic Modalities:   Cognitive Behavioral Therapy Solution Focused Therapy Motivational Interviewing Relapse Prevention Therapy   Rozann Lesches, LCSW

## 2022-03-24 ENCOUNTER — Ambulatory Visit (INDEPENDENT_AMBULATORY_CARE_PROVIDER_SITE_OTHER): Payer: Medicaid Other | Admitting: Obstetrics and Gynecology

## 2022-03-24 ENCOUNTER — Encounter: Payer: Self-pay | Admitting: Obstetrics and Gynecology

## 2022-03-24 ENCOUNTER — Other Ambulatory Visit (HOSPITAL_COMMUNITY)
Admission: RE | Admit: 2022-03-24 | Discharge: 2022-03-24 | Disposition: A | Discharge status: Home / Self Care | Payer: Medicaid Other | Source: Ambulatory Visit | Attending: Advanced Practice Midwife | Admitting: Advanced Practice Midwife

## 2022-03-24 VITALS — BP 126/80 | HR 100 | Wt 175.0 lb

## 2022-03-24 DIAGNOSIS — Z3A16 16 weeks gestation of pregnancy: Secondary | ICD-10-CM

## 2022-03-24 DIAGNOSIS — N898 Other specified noninflammatory disorders of vagina: Secondary | ICD-10-CM | POA: Diagnosis present

## 2022-03-24 DIAGNOSIS — B3731 Acute candidiasis of vulva and vagina: Secondary | ICD-10-CM

## 2022-03-24 DIAGNOSIS — K117 Disturbances of salivary secretion: Secondary | ICD-10-CM

## 2022-03-24 DIAGNOSIS — Z3482 Encounter for supervision of other normal pregnancy, second trimester: Secondary | ICD-10-CM

## 2022-03-24 DIAGNOSIS — Z3009 Encounter for other general counseling and advice on contraception: Secondary | ICD-10-CM

## 2022-03-24 MED ORDER — GLYCOPYRROLATE 1 MG PO TABS: 1.0000 mg | ORAL_TABLET | Freq: Three times a day (TID) | ORAL | 3 refills | Status: DC

## 2022-03-24 NOTE — Patient Instructions (Signed)
Alpha-Fetoprotein Test Why am I having this test? The alpha-fetoprotein test is a lab test most commonly used for pregnant women to help screen for birth defects in their unborn baby. It can be used to screen for chromosome (DNA) abnormalities, problems with the brain or spinal cord, or problems with the abdominal wall of the unborn baby (fetus). The alpha-fetoprotein test may also be done for men or nonpregnant women to check for certain cancers. What is being tested? This test measures the amount of alpha-fetoprotein (AFP) in your blood. AFP is a protein that is made by the liver. Levels can be detected in the mother's blood during pregnancy, starting at 10 weeks and peaking at 16-18 weeks of the pregnancy. Abnormal levels can sometimes be a sign of a birth defect in the baby. Certain cancers can cause a high level of AFP in men and nonpregnant women. What kind of sample is taken?  A blood sample is required for this test. It is usually collected by inserting a needle into a blood vessel. How are the results reported? Your test results will be reported as values. Your health care provider will compare your results to normal ranges that were established after testing a large group of people (reference values). Reference values may vary among labs and hospitals. For this test, common reference values are: Adult: Less than 40 ng/mL or less than 40 mcg/L (SI units). Child younger than 1 year: Less than 30 ng/mL. If you are pregnant, the values may also vary based on how long you have been pregnant. What do the results mean? Results that are above the reference values in pregnant women may indicate the following for the baby: Neural tube defects, such as abnormalities of the spinal cord or brain. Abdominal wall defects. Multiple pregnancy such as twins. Fetal distress or fetal death. Results that are above the reference values in men or nonpregnant women may indicate: Reproductive cancers, such as  ovarian or testicular cancer. Liver cancer. Liver cell death. Other types of cancer. Very low levels of AFP in pregnant women may indicate Down syndrome for the baby. Talk with your health care provider about what your results mean. Questions to ask your health care provider Ask your health care provider, or the department that is doing the test: When will my results be ready? How will I get my results? What are my treatment options? What other tests do I need? What are my next steps? Summary The alpha-fetoprotein test is done on pregnant women to help screen for birth defects in their unborn baby. Certain cancers can cause a high level of AFP in men and nonpregnant women. For this test, a blood sample is usually collected by inserting a needle into a blood vessel. Talk with your health care provider about what your results mean. This information is not intended to replace advice given to you by your health care provider. Make sure you discuss any questions you have with your health care provider. Document Revised: 08/26/2019 Document Reviewed: 08/26/2019 Elsevier Patient Education  [place].

## 2022-03-24 NOTE — Progress Notes (Signed)
Pt presents for ROB visit. Pt had recent visit with [facility]. Denies any thoughts of self-harm. Pt states her medication is helping with her mood. Pt would like to be tested for yeast and BV. Pt reports having vaginal irritation, odor and white discharge. Denies any pain. No other concerns at this time.  PHQ - 10 GAD - 6

## 2022-03-24 NOTE — Progress Notes (Signed)
   LOW-RISK PREGNANCY OFFICE VISIT Patient name: Victoria Garrett MRN [id number]  Date of birth: Feb 24, 2000 Chief Complaint:   Routine Prenatal Visit  History of Present Illness:   Victoria Garrett is a 22 y.o. G60P2002 female at [redacted]w[redacted]d with an Estimated Date of Delivery: 09/07/22 being seen today for ongoing management of a low-risk pregnancy.  Today she reports vaginal irritation. Contractions: Not present. Vag. Bleeding: None.  Movement: Present. denies leaking of fluid. Review of Systems:   Pertinent items are noted in HPI Denies abnormal vaginal discharge w/ itching/odor/irritation, headaches, visual changes, shortness of breath, chest pain, abdominal pain, severe nausea/vomiting, or problems with urination or bowel movements unless otherwise stated above. Pertinent History Reviewed:  Reviewed past medical,surgical, social, obstetrical and family history.  Reviewed problem list, medications and allergies. Physical Assessment:   Vitals:   03/24/22 1000  BP: 126/80  Pulse: 100  Weight: 175 lb (79.4 kg)  Body mass index is 32.01 kg/m.        Physical Examination:   General appearance: Well appearing, and in no distress  Mental status: Alert, oriented to person, place, and time  Skin: Warm & dry  Cardiovascular: Normal heart rate noted  Respiratory: Normal respiratory effort, no distress  Abdomen: Soft, gravid, nontender  Pelvic: Cervical exam deferred         Extremities: Edema: None  Fetal Status: Fetal Heart Rate (bpm): 147   Movement: Present    No results found for this or any previous visit (from the past 24 hour(s)).  Assessment & Plan:  1) Low-risk pregnancy G3P2002 at [redacted]w[redacted]d with an Estimated Date of Delivery: 09/07/22   2) Supervision of normal intrauterine pregnancy in multigravida in second trimester - [place] MFM OB DETAIL +14 WK; Future - AFP today  3) Vaginal discharge - Cervicovaginal ancillary only  4) Hypersalivation -Rx: glycopyrrolate (ROBINUL) 1  MG tablet; Take 1 tablet (1 mg total) by mouth 3 (three) times daily.  Dispense: 90 tablet; Refill: 3  5) Unwanted fertility - Advised will need to have an appt with one of our MDs  6) [redacted] weeks gestation of pregnancy    Meds:  Meds ordered this encounter  Medications   glycopyrrolate (ROBINUL) 1 MG tablet    Sig: Take 1 tablet (1 mg total) by mouth 3 (three) times daily.    Dispense:  90 tablet    Refill:  3    Order Specific Question:   Supervising Provider    Answer:   Donnamae Jude [[id number]]   Labs/procedures today: AFP  Plan:  Continue routine obstetrical care   Reviewed: Preterm labor symptoms and general obstetric precautions including but not limited to vaginal bleeding, contractions, leaking of fluid and fetal movement were reviewed in detail with the patient.  All questions were answered. Has home bp cuff. Check bp weekly, let us know if >140/90.   Follow-up: Return in about 4 weeks (around 04/21/2022) for Return OB visit.  Orders Placed This Encounter  Procedures   [place] MFM OB DETAIL +14 WK   AFP, Serum, Open Spina Bifida   Laury Deep MSN, CNM 03/24/2022 10:20 AM

## 2022-03-25 ENCOUNTER — Encounter: Payer: Self-pay | Admitting: Obstetrics and Gynecology

## 2022-03-25 LAB — CERVICOVAGINAL ANCILLARY ONLY
Bacterial Vaginitis (gardnerella): NEGATIVE
Candida Glabrata: NEGATIVE
Candida Vaginitis: POSITIVE — AB
Chlamydia: NEGATIVE
Comment: NEGATIVE
Comment: NEGATIVE
Comment: NEGATIVE
Comment: NEGATIVE
Comment: NEGATIVE
Comment: NORMAL
Neisseria Gonorrhea: NEGATIVE
Trichomonas: NEGATIVE

## 2022-03-25 MED ORDER — TERCONAZOLE 0.4 % VA CREA: 1.0000 | TOPICAL_CREAM | Freq: Every day | VAGINAL | 0 refills | Status: DC

## 2022-03-25 NOTE — Addendum Note (Signed)
Addended by: Graceann Congress on: 03/25/2022 04:27 PM   Modules accepted: Orders

## 2022-03-26 ENCOUNTER — Encounter: Payer: Self-pay | Admitting: Obstetrics and Gynecology

## 2022-03-26 LAB — AFP, SERUM, OPEN SPINA BIFIDA
AFP MoM: 1.78
AFP Value: 54.6 ng/mL
Gest. Age on Collection Date: 16 weeks
Maternal Age At EDD: 22.8 yr
OSBR Risk 1 IN: 1341
Test Results:: NEGATIVE
Weight: 175 [lb_av]

## 2022-03-31 ENCOUNTER — Telehealth: Payer: Self-pay | Admitting: *Deleted

## 2022-03-31 NOTE — Telephone Encounter (Signed)
TC from pt reporting "streaks of blood" in vomit this am. Pt has prolonged nausea in pregnancy and RX Zofran. Reassured pt that stomach and esophageal irritation can lead to streaks of blood in vomit. Advised pt to continue Zofran and seek care if unable to keep food or fluids down or for a larger amount of blood noted in vomit.

## 2022-04-02 ENCOUNTER — Inpatient Hospital Stay (HOSPITAL_COMMUNITY)
Admission: AD | Admit: 2022-04-02 | Discharge: 2022-04-02 | Disposition: A | Discharge status: Home / Self Care | Payer: Medicaid Other | Attending: Obstetrics and Gynecology | Admitting: Obstetrics and Gynecology

## 2022-04-02 ENCOUNTER — Other Ambulatory Visit: Payer: Self-pay

## 2022-04-02 DIAGNOSIS — G5603 Carpal tunnel syndrome, bilateral upper limbs: Secondary | ICD-10-CM | POA: Diagnosis not present

## 2022-04-02 DIAGNOSIS — O26892 Other specified pregnancy related conditions, second trimester: Secondary | ICD-10-CM | POA: Insufficient documentation

## 2022-04-02 DIAGNOSIS — Z711 Person with feared health complaint in whom no diagnosis is made: Secondary | ICD-10-CM | POA: Insufficient documentation

## 2022-04-02 DIAGNOSIS — M5430 Sciatica, unspecified side: Secondary | ICD-10-CM | POA: Diagnosis not present

## 2022-04-02 DIAGNOSIS — Z3A17 17 weeks gestation of pregnancy: Secondary | ICD-10-CM | POA: Insufficient documentation

## 2022-04-02 DIAGNOSIS — Z3480 Encounter for supervision of other normal pregnancy, unspecified trimester: Secondary | ICD-10-CM

## 2022-04-02 NOTE — MAU Note (Signed)
Victoria Garrett is a 22 y.o. at 58w3dhere in MAU reporting: started feeling numbness in her right and left hand around 1630. States numbness goes up into her arms. Knees also feel heavy. No pain, bleeding, or LOF.   Onset of complaint: today  Pain score: 0/10  Vitals:   04/02/22 1824  BP: 128/75  Pulse: 98  Resp: 16  Temp: 99 F (37.2 C)  SpO2: 100%     FHT:145  Lab orders placed from triage: none

## 2022-04-02 NOTE — MAU Provider Note (Signed)
Event Date/Time   First Provider Initiated Contact with Patient 04/02/22 1843      S Ms. Kirstien Overy is a 22 y.o. [id number] patient who presents to MAU today with complaint of numbness and tingling in both hands. Patient states she was sitting in bed scrolling on her phone and her hand began to feel numb and tingling. She states she thought it was carpel tunnel and started wiggling her fingers and the sensation became worse. She also noted at that time, it began on the other hand as well. She states she called the RN line and was told to report to MAU. She denies weakness, shortness of breath, chest pain, changes in vision and lightheadedness and dizziness. She denies difficulty walking, or feeling weak with movement. She reports that she has carpel tunnel and problems with sciatica and this "feels similar, but its just lasting a lot longer."  She denies vaginal bleeding, abnormal vaginal discharge and endorses flutters present.   O BP 128/75 (BP Location: Right Arm)   Pulse 98   Temp 99 F (37.2 C) (Oral)   Resp 16   Ht 5' 2"$  (1.575 m)   Wt 80.6 kg   LMP 11/16/2021 (Exact Date)   SpO2 100% Comment: room air  BMI 32.52 kg/m  Physical Exam Vitals and nursing note reviewed.  Constitutional:      General: She is not in acute distress.    Appearance: Normal appearance.  HENT:     Head: Normocephalic.  Pulmonary:     Effort: Pulmonary effort is normal.  Musculoskeletal:     Cervical back: Normal range of motion.  Skin:    General: Skin is warm and dry.  Neurological:     Mental Status: She is alert and oriented to person, place, and time.  Psychiatric:        Mood and Affect: Mood normal.     A Medical screening exam complete 1. Physically well but worried   2. Supervision of other normal pregnancy, antepartum   3. Bilateral carpal tunnel syndrome   4. Sciatic leg pain   5. [redacted] weeks gestation of pregnancy   - Plan for discharge    P - Reviewed that carpel tunnel  in pregnancy is common, and is a normal discomfort of pregnancy. Encouraged the use of wrist braces and frequency in movement.  - Discharge from MAU in stable condition - Patient given the option of transfer to [facility] for further evaluation or seek care in outpatient facility of choice .  - Warning signs for worsening condition that would warrant emergency follow-up discussed Patient may return to MAU as needed   Deloris Ping, [place] 04/02/2022 6:52 PM

## 2022-04-03 ENCOUNTER — Other Ambulatory Visit: Payer: Self-pay

## 2022-04-03 ENCOUNTER — Other Ambulatory Visit (HOSPITAL_COMMUNITY): Payer: Self-pay

## 2022-04-09 ENCOUNTER — Other Ambulatory Visit (HOSPITAL_COMMUNITY): Payer: Self-pay

## 2022-04-09 ENCOUNTER — Other Ambulatory Visit: Payer: Self-pay

## 2022-04-10 ENCOUNTER — Other Ambulatory Visit (HOSPITAL_COMMUNITY): Payer: Self-pay

## 2022-04-17 ENCOUNTER — Encounter: Payer: Self-pay | Admitting: *Deleted

## 2022-04-17 ENCOUNTER — Ambulatory Visit: Payer: Medicaid Other | Attending: Obstetrics and Gynecology

## 2022-04-17 ENCOUNTER — Other Ambulatory Visit: Payer: Self-pay | Admitting: *Deleted

## 2022-04-17 ENCOUNTER — Ambulatory Visit: Payer: Medicaid Other | Admitting: *Deleted

## 2022-04-17 VITALS — BP 118/63 | HR 90

## 2022-04-17 DIAGNOSIS — Z3A19 19 weeks gestation of pregnancy: Secondary | ICD-10-CM

## 2022-04-17 DIAGNOSIS — Z3482 Encounter for supervision of other normal pregnancy, second trimester: Secondary | ICD-10-CM | POA: Insufficient documentation

## 2022-04-17 DIAGNOSIS — Z363 Encounter for antenatal screening for malformations: Secondary | ICD-10-CM | POA: Insufficient documentation

## 2022-04-17 DIAGNOSIS — O99212 Obesity complicating pregnancy, second trimester: Secondary | ICD-10-CM

## 2022-04-17 DIAGNOSIS — E669 Obesity, unspecified: Secondary | ICD-10-CM

## 2022-04-17 DIAGNOSIS — O43192 Other malformation of placenta, second trimester: Secondary | ICD-10-CM | POA: Diagnosis not present

## 2022-04-17 DIAGNOSIS — O43199 Other malformation of placenta, unspecified trimester: Secondary | ICD-10-CM

## 2022-04-18 ENCOUNTER — Encounter (HOSPITAL_BASED_OUTPATIENT_CLINIC_OR_DEPARTMENT_OTHER): Payer: Self-pay | Admitting: Advanced Practice Midwife

## 2022-04-18 DIAGNOSIS — T50902A Poisoning by unspecified drugs, medicaments and biological substances, intentional self-harm, initial encounter: Secondary | ICD-10-CM | POA: Insufficient documentation

## 2022-04-20 ENCOUNTER — Other Ambulatory Visit (HOSPITAL_COMMUNITY): Payer: Self-pay

## 2022-04-21 ENCOUNTER — Inpatient Hospital Stay (HOSPITAL_COMMUNITY)
Admission: AD | Admit: 2022-04-21 | Discharge: 2022-04-21 | Disposition: A | Discharge status: Home / Self Care | Payer: Medicaid Other | Attending: Family Medicine | Admitting: Family Medicine

## 2022-04-21 ENCOUNTER — Encounter (HOSPITAL_COMMUNITY): Payer: Self-pay | Admitting: Family Medicine

## 2022-04-21 ENCOUNTER — Ambulatory Visit (INDEPENDENT_AMBULATORY_CARE_PROVIDER_SITE_OTHER): Payer: Medicaid Other | Admitting: Advanced Practice Midwife

## 2022-04-21 ENCOUNTER — Inpatient Hospital Stay (HOSPITAL_COMMUNITY): Payer: Medicaid Other

## 2022-04-21 ENCOUNTER — Encounter: Payer: Self-pay | Admitting: Advanced Practice Midwife

## 2022-04-21 VITALS — BP 122/78 | HR 96 | Wt 181.2 lb

## 2022-04-21 DIAGNOSIS — Z3A20 20 weeks gestation of pregnancy: Secondary | ICD-10-CM | POA: Insufficient documentation

## 2022-04-21 DIAGNOSIS — Z3480 Encounter for supervision of other normal pregnancy, unspecified trimester: Secondary | ICD-10-CM

## 2022-04-21 DIAGNOSIS — O26619 Liver and biliary tract disorders in pregnancy, unspecified trimester: Secondary | ICD-10-CM

## 2022-04-21 DIAGNOSIS — R0602 Shortness of breath: Secondary | ICD-10-CM

## 2022-04-21 DIAGNOSIS — K760 Fatty (change of) liver, not elsewhere classified: Secondary | ICD-10-CM | POA: Diagnosis not present

## 2022-04-21 DIAGNOSIS — O99612 Diseases of the digestive system complicating pregnancy, second trimester: Secondary | ICD-10-CM | POA: Diagnosis not present

## 2022-04-21 DIAGNOSIS — Z3482 Encounter for supervision of other normal pregnancy, second trimester: Secondary | ICD-10-CM

## 2022-04-21 DIAGNOSIS — M25511 Pain in right shoulder: Secondary | ICD-10-CM

## 2022-04-21 DIAGNOSIS — K8020 Calculus of gallbladder without cholecystitis without obstruction: Secondary | ICD-10-CM

## 2022-04-21 DIAGNOSIS — K8000 Calculus of gallbladder with acute cholecystitis without obstruction: Secondary | ICD-10-CM | POA: Insufficient documentation

## 2022-04-21 DIAGNOSIS — Z124 Encounter for screening for malignant neoplasm of cervix: Secondary | ICD-10-CM

## 2022-04-21 DIAGNOSIS — R0789 Other chest pain: Secondary | ICD-10-CM

## 2022-04-21 LAB — COMPREHENSIVE METABOLIC PANEL
ALT: 16 U/L (ref 0–44)
AST: 16 U/L (ref 15–41)
Albumin: 3.3 g/dL — ABNORMAL LOW (ref 3.5–5.0)
Alkaline Phosphatase: 44 U/L (ref 38–126)
Anion gap: 15 (ref 5–15)
BUN: <5 mg/dL — ABNORMAL LOW (ref 6–20)
CO2: 18 mmol/L — ABNORMAL LOW (ref 22–32)
Calcium: 9.3 mg/dL (ref 8.9–10.3)
Chloride: 104 mmol/L (ref 98–111)
Creatinine, Ser: 0.63 mg/dL (ref 0.44–1.00)
GFR, Estimated: >60 mL/min (ref >60)
Glucose, Bld: 121 mg/dL — ABNORMAL HIGH (ref 70–99)
Potassium: 3.4 mmol/L — ABNORMAL LOW (ref 3.5–5.1)
Sodium: 137 mmol/L (ref 135–145)
Total Bilirubin: 0.3 mg/dL (ref 0.3–1.2)
Total Protein: 6.7 g/dL (ref 6.5–8.1)

## 2022-04-21 LAB — LIPASE, BLOOD: Lipase: 28 U/L (ref 11–51)

## 2022-04-21 LAB — AMYLASE: Amylase: 135 U/L — ABNORMAL HIGH (ref 28–100)

## 2022-04-21 LAB — CBC
HCT: 35.1 % — ABNORMAL LOW (ref 36.0–46.0)
Hemoglobin: 12.4 g/dL (ref 12.0–15.0)
MCH: 29.4 pg (ref 26.0–34.0)
MCHC: 35.3 g/dL (ref 30.0–36.0)
MCV: 83.2 fL (ref 80.0–100.0)
Platelets: 128 10*3/uL — ABNORMAL LOW (ref 150–400)
RBC: 4.22 MIL/uL (ref 3.87–5.11)
RDW: 12.7 % (ref 11.5–15.5)
WBC: 9.5 10*3/uL (ref 4.0–10.5)
nRBC: 0 % (ref 0.0–0.2)

## 2022-04-21 MED ORDER — OXYCODONE-ACETAMINOPHEN 5-325 MG PO TABS: 1.0000 | ORAL_TABLET | Freq: Four times a day (QID) | ORAL | 0 refills | Status: DC | PRN

## 2022-04-21 NOTE — Progress Notes (Signed)
Pt presents for ROB visit. Pt reports having pain in her right shoulder that radiates to her chest. No other concerns at this time.

## 2022-04-21 NOTE — MAU Note (Cosign Needed Addendum)
History    Chief Complaint  Patient presents with   Back Pain   HPI  Victoria Garrett is a 22yo F G3P2 '@[redacted]w[redacted]d'$  presenting w/ back pain. Reports discomfort started in R mid-back, describes the sensation as "falling off a swing and landing on back". Describes pain as sharp. Pain starts in back, but then radiates towards R breast and center of chest. Pain started last night and lasted till this AM. Reports similar episodes of back pain in the past, but they self-resolved and lasted much shorter. Reports her "breath catching" occasionally due to the pain, but denies SOB or pleuritic chest pain.  Reports some N/V, but similar to morning sickness she's had during this pregnancy. Denies dysuria.   OB History     Gravida  3   Para  2   Term  2   Preterm      AB      Living  2      SAB      IAB      Ectopic      Multiple  0   Live Births  2           Past Medical History:  Diagnosis Date   Anemia    Benign gestational thrombocytopenia in third trimester ([place]) 02/28/2020   Plts 135 at new OB, then 127 at 28 weeks Check platelets at 36 week visit '[ ]'$     Heart murmur    as a child    Preterm uterine contractions in third trimester, antepartum 02/28/2020   Supervision of other normal pregnancy, antepartum 02/13/2020    Nursing Staff Provider Office Location [place]  Dating   6w 5d [place] Language  ENGLISH Anatomy US   WNL visible in Care everywhere 10/2019 Flu Vaccine  02/14/2020 Genetic Screen  NIPS:   AFP:   First Screen:  Quad:   TDaP Vaccine   02/14/2020 Hgb A1C or  GTT Normal 1 hour 01/04/2020 per [place] Vaccine NOT VACCINATED   LAB RESULTS  Rhogam   Blood Type    B POS Feeding Plan BREASTFEED A   UTI in pregnancy 03/02/2020   Vs bacteriuria. Treated 1/14. '[ ]'$  TOC    Past Surgical History:  Procedure Laterality Date   IR NEPHROURETERAL CATH PLACE RIGHT Right 10/2015   NEPHROSTOMY TUBE PLACEMENT ([place] HX)      Family History  Problem Relation Age of Onset   Healthy  Mother    Healthy Father    Asthma Sister    Diabetes Maternal Grandmother     Social History   Tobacco Use   Smoking status: Never   Smokeless tobacco: Never  Vaping Use   Vaping Use: Some days   Substances: Flavoring  Substance Use Topics   Alcohol use: Not Currently   Drug use: Not Currently    Types: Marijuana    Comment: LAST USED MONTHS AGO    Allergies: No Known Allergies  Medications Prior to Admission  Medication Sig Dispense Refill Last Dose   glycopyrrolate (ROBINUL) 1 MG tablet Take 1 tablet (1 mg total) by mouth 3 (three) times daily. 90 tablet 3 04/21/2022   hydrOXYzine (ATARAX) 50 MG tablet Take 1 tablet (50 mg total) by mouth every 6 (six) hours as needed for anxiety. 60 tablet 0 04/20/2022   mirtazapine (REMERON) 45 MG tablet Take 1 tablet (45 mg total) by mouth at bedtime. 30 tablet 0 04/20/2022   ondansetron (ZOFRAN) 4 MG tablet Take 1 tablet (4  mg total) by mouth every 6 (six) hours as needed. 60 tablet 0 04/21/2022   Prenatal Vit-Fe Fumarate-FA (PRENATAL MULTIVITAMIN) TABS tablet Take 1 tablet by mouth daily. 30 tablet 0    pyridOXINE (VITAMIN B6) 25 MG tablet Take 1 tablet (25 mg total) by mouth 3 (three) times daily. (Patient not taking: Reported on 04/17/2022) 90 tablet 0    QUEtiapine (SEROQUEL) 50 MG tablet Take 1 tablet (50 mg total) by mouth at bedtime as needed (sleep). (Patient not taking: Reported on 04/17/2022) 30 tablet 0    terconazole (TERAZOL 7) 0.4 % vaginal cream Place 1 applicator vaginally at bedtime. Use for seven days (Patient not taking: Reported on 04/17/2022) 45 g 0     Review of Systems  Constitutional: Negative.  Negative for fatigue and fever.  HENT: Negative.    Respiratory: Negative.  Negative for shortness of breath.   Cardiovascular: Negative.  Negative for chest pain.  Gastrointestinal:  Positive for abdominal pain. Negative for constipation, diarrhea, nausea and vomiting.  Genitourinary: Negative.  Negative for dysuria, vaginal  bleeding and vaginal discharge.  Musculoskeletal:  Positive for back pain.  Neurological: Negative.  Negative for dizziness and headaches.   Physical Exam Blood pressure 126/71, pulse 93, temperature 98.3 F (36.8 C), resp. rate 18, height '5\' 2"'$  (1.575 m), weight 82.1 kg, last menstrual period 11/16/2021, SpO2 99 %. Physical Exam Vitals and nursing note reviewed.  Constitutional:      General: She is not in acute distress.    Appearance: She is well-developed.  HENT:     Head: Normocephalic.  Eyes:     Pupils: Pupils are equal, round, and reactive to light.  Cardiovascular:     Rate and Rhythm: Normal rate and regular rhythm.     Heart sounds: Normal heart sounds.  Pulmonary:     Effort: Pulmonary effort is normal. No respiratory distress.     Breath sounds: Normal breath sounds.  Abdominal:     General: Bowel sounds are normal. There is no distension.     Palpations: Abdomen is soft.     Tenderness: There is no abdominal tenderness.  Skin:    General: Skin is warm and dry.  Neurological:     Mental Status: She is alert and oriented to person, place, and time.  Psychiatric:        Mood and Affect: Mood normal.        Behavior: Behavior normal.        Thought Content: Thought content normal.        Judgment: Judgment normal.    Gen: Well-appearing, alert, resting comfortably in bed. NAD. CV: RRR, no murmurs. Resp: CTAB. Normal WOB on RA. No pleuritic chest pain. Abm: Nontender, nondistended, soft. Normal BS. Msk: Mild discomfort to palpation along R thoracic back.  FHT: 142 bpm  MAU Course Procedures  MDM  1430: Denies pain currently - VSS and EKG wnl - RUQ US showed acute cholecystitis.   A/P  Acute Cholelithiasis Pt presents with episodic R back pain that comes and goes w/o clear triggers. Denies SOB. VSS. No leukocytosis. Liver enzymes wnl. RUQ US showed AC. EKG unremarkable. History and imaging findings most support a diagnosis of acute cholelithiasis.  Reassuringly, pt is well appearing, VSS, afebrile, and pain currently controlled. - Provided dietary modifications  - Provided prn Percocet for pain control - Discussed that surgery is not recommended in pregnancy. Can consider surgical intervention postpartum.  - Return precautions discussed   Arlyce Dice, PGY-1  Attestation of Supervision of Student:  I confirm that I have verified the information documented in the  resident  student's note and that I have also personally reperformed the history, physical exam and all medical decision making activities.  I have verified that all services and findings are accurately documented in this student's note; and I agree with management and plan as outlined in the documentation. I have also made any necessary editorial changes.  Wende Mott, [place] for [employer], [place] [employer] 04/21/2022 3:30 PM

## 2022-04-21 NOTE — Progress Notes (Signed)
   PRENATAL VISIT NOTE  Subjective:  Victoria Garrett is a 22 y.o. G3P2002 at 91w1dbeing seen today for ongoing prenatal care.  She is currently monitored for the following issues for this low-risk pregnancy and has Supervision of other normal pregnancy, antepartum; Generalized anxiety disorder with panic attacks; Major depressive disorder affecting pregnancy; Poor social situation; History of pyelonephritis during pregnancy; Hydronephrosis, right; PTSD (post-traumatic stress disorder); Deliberate self-cutting; ASCUS with positive high risk HPV cervical; Vaginal discharge; Severe recurrent major depression without psychotic features ([place]; and Suicide attempt by drug overdose ([place] on their problem list.  Patient reports  significant pain in her right shoulder, right upper back that radiates to her chest .  Contractions: Not present. Vag. Bleeding: None.  Movement: Present. Denies leaking of fluid.   The following portions of the patient's history were reviewed and updated as appropriate: allergies, current medications, past family history, past medical history, past social history, past surgical history and problem list.   Objective:   Vitals:   04/21/22 1041  BP: 122/78  Pulse: 96  Weight: 181 lb 3.2 oz (82.2 kg)    Fetal Status: Fetal Heart Rate (bpm): 140   Movement: Present     General:  Alert, oriented and cooperative. Patient is in no acute distress.  Skin: Skin is warm and dry. No rash noted.   Cardiovascular: Normal heart rate noted  Respiratory: Normal respiratory effort, no problems with respiration noted  Abdomen: Soft, gravid, appropriate for gestational age.  Pain/Pressure: Present     Pelvic: Cervical exam deferred        Extremities: Normal range of motion.  Edema: None  Mental Status: Normal mood and affect. Normal behavior. Normal judgment and thought content.   Assessment and Plan:  Pregnancy: G3P2002 at 218w1d. Pap smear for cervical cancer  screening --Deferred--will do postpartum  2. Supervision of other normal pregnancy, antepartum --Anticipatory guidance about next visits/weeks of pregnancy given.   3. [redacted] weeks gestation of pregnancy   4. Acute pain of right shoulder --Pain in right upper back/right shoulder that is intermittent, severe when it occurs, and radiates down into her chest causes shortness of breath. Last night she was unable to sleep with pain all night.  It is not associated with eating or position. She denies any changes in activity. --during today's visit, the pain occurred, pt became short of breath, grimacing, and with tears in her eyes. --Lung and heart sounds wnl --Pt sent to MAU for further work up  5. Shortness of breath --associated with shoulder/chest pain   6. Other chest pain   Preterm labor symptoms and general obstetric precautions including but not limited to vaginal bleeding, contractions, leaking of fluid and fetal movement were reviewed in detail with the patient. Please refer to After Visit Summary for other counseling recommendations.   No follow-ups on file.  Future Appointments  Date Time Provider [place]4/25/2024  9:15 AM [facility]4/25/2024  9:30 AM [facility] US3 [facility]  LiFatima BlankCNM

## 2022-04-21 NOTE — MAU Note (Signed)
.  Victoria Garrett is a 22 y.o. at 19w1dhere in MAU reporting: c/o Sharp pain in he right mid back that radiates towards her front chest. Feels like the breath was knocked out of her. Stated holding her breath is the only thing that makes it stop. LMP:  Onset of complaint: last night Pain score: 4 Vitals:   04/21/22 1209  BP: 126/71  Pulse: 93  Resp: 18  Temp: 98.3 F (36.8 C)  SpO2: 99%     FHT:142 Lab orders placed from triage:

## 2022-04-21 NOTE — Discharge Instructions (Signed)

## 2022-04-24 ENCOUNTER — Inpatient Hospital Stay (HOSPITAL_COMMUNITY)
Admission: AD | Admit: 2022-04-24 | Discharge: 2022-04-25 | Disposition: A | Discharge status: Home / Self Care | Payer: Medicaid Other | Attending: Obstetrics and Gynecology | Admitting: Obstetrics and Gynecology

## 2022-04-24 DIAGNOSIS — Z3A20 20 weeks gestation of pregnancy: Secondary | ICD-10-CM | POA: Insufficient documentation

## 2022-04-24 DIAGNOSIS — S61309A Unspecified open wound of unspecified finger with damage to nail, initial encounter: Secondary | ICD-10-CM | POA: Insufficient documentation

## 2022-04-24 DIAGNOSIS — Z3A12 12 weeks gestation of pregnancy: Secondary | ICD-10-CM

## 2022-04-24 DIAGNOSIS — Z3480 Encounter for supervision of other normal pregnancy, unspecified trimester: Secondary | ICD-10-CM

## 2022-04-24 DIAGNOSIS — Z3482 Encounter for supervision of other normal pregnancy, second trimester: Secondary | ICD-10-CM | POA: Insufficient documentation

## 2022-04-24 DIAGNOSIS — W208XXA Other cause of strike by thrown, projected or falling object, initial encounter: Secondary | ICD-10-CM | POA: Insufficient documentation

## 2022-04-24 DIAGNOSIS — Z3491 Encounter for supervision of normal pregnancy, unspecified, first trimester: Secondary | ICD-10-CM

## 2022-04-25 ENCOUNTER — Ambulatory Visit
Admission: EM | Admit: 2022-04-25 | Discharge: 2022-04-25 | Disposition: A | Discharge status: Home / Self Care | Payer: Medicaid Other

## 2022-04-25 DIAGNOSIS — S6992XA Unspecified injury of left wrist, hand and finger(s), initial encounter: Secondary | ICD-10-CM | POA: Diagnosis not present

## 2022-04-25 DIAGNOSIS — Z3A20 20 weeks gestation of pregnancy: Secondary | ICD-10-CM | POA: Diagnosis not present

## 2022-04-25 DIAGNOSIS — Z3A12 12 weeks gestation of pregnancy: Secondary | ICD-10-CM | POA: Diagnosis not present

## 2022-04-25 DIAGNOSIS — S61309A Unspecified open wound of unspecified finger with damage to nail, initial encounter: Secondary | ICD-10-CM | POA: Diagnosis not present

## 2022-04-25 DIAGNOSIS — M79645 Pain in left finger(s): Secondary | ICD-10-CM

## 2022-04-25 DIAGNOSIS — W208XXA Other cause of strike by thrown, projected or falling object, initial encounter: Secondary | ICD-10-CM | POA: Diagnosis not present

## 2022-04-25 DIAGNOSIS — Z3482 Encounter for supervision of other normal pregnancy, second trimester: Secondary | ICD-10-CM | POA: Diagnosis present

## 2022-04-25 NOTE — Discharge Instructions (Signed)
Advised take Tylenol as needed for pain relief. Advised to soak the finger in salt water for 5 minutes, 3-4 times throughout the day to help with healing and to decrease chances of infection.  1 teaspoon salt, warm water, 5 minutes each episode. May clean the area with warm soapy water twice a day. Watch for any signs of infection such as redness, swelling, drainage.  If this occurs then need to be seen and reevaluated at that time.  Follow-up family practice return to urgent care as needed.

## 2022-04-25 NOTE — ED Provider Notes (Signed)
[facility]    CSN: [id number] Arrival date & time: 04/25/22  0816      History   Chief Complaint Chief Complaint  Patient presents with   Nail Problem    HPI Victoria Garrett is a 22 y.o. female.   22 year old female presents with left fifth digit nail injury.  Patient indicates that she was working with a heavy lamp when it fell and she caught it but it went to her left hand fifth digit fingernail back and separated it from the nailbed.  Patient indicates she does have artificial nails which contributed to the injury.  She indicates that it is only hanging on by the backside of the nail margin.  She indicates she does have some mild pain and discomfort.  She indicates to have the area repaired or the nail removed.  She is without fever or chills.     Past Medical History:  Diagnosis Date   Anemia    Benign gestational thrombocytopenia in third trimester ([place]) 02/28/2020   Plts 135 at new OB, then 127 at 28 weeks Check platelets at 36 week visit '[ ]'$     Heart murmur    as a child    Preterm uterine contractions in third trimester, antepartum 02/28/2020   Supervision of other normal pregnancy, antepartum 02/13/2020    Nursing Staff Provider Office Location [place]  Dating   6w 5d [place] Language  ENGLISH Anatomy US   WNL visible in Care everywhere 10/2019 Flu Vaccine  02/14/2020 Genetic Screen  NIPS:   AFP:   First Screen:  Quad:   TDaP Vaccine   02/14/2020 Hgb A1C or  GTT Normal 1 hour 01/04/2020 per [place] Vaccine NOT VACCINATED   LAB RESULTS  Rhogam   Blood Type    B POS Feeding Plan BREASTFEED A   UTI in pregnancy 03/02/2020   Vs bacteriuria. Treated 1/14. '[ ]'$  TOC    Patient Active Problem List   Diagnosis Date Noted   Suicide attempt by drug overdose ([place]) 04/18/2022   Severe recurrent major depression without psychotic features ([place]) 03/03/2022   ASCUS with positive high risk HPV cervical 04/11/2021   Vaginal discharge 04/11/2021   Deliberate  self-cutting 03/20/2020   Generalized anxiety disorder with panic attacks 02/14/2020   Major depressive disorder affecting pregnancy 02/14/2020   Poor social situation 02/14/2020   History of pyelonephritis during pregnancy 02/14/2020   Supervision of other normal pregnancy, antepartum 02/13/2020   PTSD (post-traumatic stress disorder) 03/03/2018   Hydronephrosis, right 10/03/2015    Past Surgical History:  Procedure Laterality Date   IR NEPHROURETERAL CATH PLACE RIGHT Right 10/2015   NEPHROSTOMY TUBE PLACEMENT ([place] HX)      OB History     Gravida  3   Para  2   Term  2   Preterm      AB      Living  2      SAB      IAB      Ectopic      Multiple  0   Live Births  2            Home Medications    Prior to Admission medications   Medication Sig Start Date End Date Taking? Authorizing Provider  glycopyrrolate (ROBINUL) 1 MG tablet Take 1 tablet (1 mg total) by mouth 3 (three) times daily. 03/24/22   Laury Deep, CNM  hydrOXYzine (ATARAX) 50 MG tablet Take 1 tablet (50 mg  total) by mouth every 6 (six) hours as needed for anxiety. 03/08/22   Clapacs, [name], MD  mirtazapine (REMERON) 45 MG tablet Take 1 tablet (45 mg total) by mouth at bedtime. 03/08/22   Clapacs, [name], MD  ondansetron (ZOFRAN) 4 MG tablet Take 1 tablet (4 mg total) by mouth every 6 (six) hours as needed. 03/08/22   Clapacs, [name], MD  oxyCODONE-acetaminophen (PERCOCET/ROXICET) 5-325 MG tablet Take 1 tablet by mouth every 6 (six) hours as needed for severe pain. 04/21/22   Wende Mott, CNM  Prenatal Vit-Fe Fumarate-FA (PRENATAL MULTIVITAMIN) TABS tablet Take 1 tablet by mouth daily. 03/09/22   Clapacs, [name], MD  pyridOXINE (VITAMIN B6) 25 MG tablet Take 1 tablet (25 mg total) by mouth 3 (three) times daily. Patient not taking: Reported on 04/17/2022 03/08/22   Clapacs, [name], MD  QUEtiapine (SEROQUEL) 50 MG tablet Take 1 tablet (50 mg total) by mouth at bedtime as needed  (sleep). Patient not taking: Reported on 04/17/2022 03/08/22   Clapacs, [name], MD  terconazole (TERAZOL 7) 0.4 % vaginal cream Place 1 applicator vaginally at bedtime. Use for seven days Patient not taking: Reported on 04/17/2022 03/25/22   Laury Deep, CNM    Family History Family History  Problem Relation Age of Onset   Healthy Mother    Healthy Father    Asthma Sister    Diabetes Maternal Grandmother     Social History Social History   Tobacco Use   Smoking status: Never   Smokeless tobacco: Never  Vaping Use   Vaping Use: Some days   Substances: Flavoring  Substance Use Topics   Alcohol use: Not Currently   Drug use: Not Currently    Types: Marijuana    Comment: LAST USED MONTHS AGO     Allergies   Patient has no known allergies.   Review of Systems Review of Systems  Skin:  Positive for wound (left 5th digit nail separation from nail bed.).     Physical Exam Triage Vital Signs ED Triage Vitals  Enc Vitals Group     BP 04/25/22 0825 116/72     Pulse Rate 04/25/22 0825 92     Resp 04/25/22 0825 20     Temp 04/25/22 0825 98 F (36.7 C)     Temp Source 04/25/22 0825 Oral     SpO2 04/25/22 0825 97 %     Weight --      Height --      Head Circumference --      Peak Flow --      Pain Score 04/25/22 0834 8     Pain Loc --      Pain Edu? --      Excl. in [place]? --    No data found.  Updated Vital Signs BP 116/72 (BP Location: Left Arm)   Pulse 92   Temp 98 F (36.7 C) (Oral)   Resp 20   LMP 11/16/2021 (Exact Date)   SpO2 97%   Visual Acuity Right Eye Distance:   Left Eye Distance:   Bilateral Distance:    Right Eye Near:   Left Eye Near:    Bilateral Near:     Physical Exam Constitutional:      Appearance: Normal appearance.  Skin:    Comments: Left hand: The fifth digit has the nail separated from the nailbed due to artificial nail being present, range of motion is normal, capillary refills normal, stability intact.  There  is no unusual  redness, swelling or bruising at the site.  Procedure: The area is cleaned with Betadine, anesthetized with 1% lidocaine, 0.25 cc injected.  The needle is removed in its entirety from the nailbed without complications.  The nailbed appears normal without any excessive trauma or laceration areas present.  The nailbed is cleaned with sterile saline, antibiotic ointment applied, dressing applied to the area.  No complications.  Neurological:     Mental Status: She is alert.      UC Treatments / Results  Labs (all labs ordered are listed, but only abnormal results are displayed) Labs Reviewed - No data to display  EKG   Radiology No results found.  Procedures Procedures (including critical care time)  Medications Ordered in UC Medications - No data to display  Initial Impression / Assessment and Plan / UC Course  I have reviewed the triage vital signs and the nursing notes.  Pertinent labs & imaging results that were available during my care of the patient were reviewed by me and considered in my medical decision making (see chart for details).    Plan: The diagnosis will be treated with the following: 1.  Pain left finger: A.  Advised take Tylenol as needed for finger pain. 2.  Injury of nailbed left finger fifth digit: A.  Advised to use Epsom salt soaks 3-4 times throughout the day to help wound heal, advised to apply antibiotic ointment to the area and wash with soap and water twice daily.  Patient advised to watch for signs of infection such as redness, swelling, or drainage and she is to have the area of evaluated if this occurs. 3.  Advised follow-up PCP return to urgent care as needed. Final Clinical Impressions(s) / UC Diagnoses   Final diagnoses:  Finger pain, left  Injury of nail bed of finger of left hand, initial encounter     Discharge Instructions      Advised take Tylenol as needed for pain relief. Advised to soak the finger in salt water for 5 minutes,  3-4 times throughout the day to help with healing and to decrease chances of infection.  1 teaspoon salt, warm water, 5 minutes each episode. May clean the area with warm soapy water twice a day. Watch for any signs of infection such as redness, swelling, drainage.  If this occurs then need to be seen and reevaluated at that time.  Follow-up family practice return to urgent care as needed.    ED Prescriptions   None    PDMP not reviewed this encounter.   Nyoka Lint, PA-C 04/25/22 [phone]

## 2022-04-25 NOTE — MAU Note (Addendum)
Gaylan Gerold CNM in Triage to see pt and discuss plan of care. CNM wrapped pt's finger with 2x2 gauze to protect the nail.  Plan of care discussed and pt d/c home from Triage by CNM

## 2022-04-25 NOTE — MAU Provider Note (Signed)
Provider Initiated Contact with Patient 04/25/22 at 12:20AM  S [name] is a 22 y.o. G14P2002 pregnant female at 54w5dwho presents to MAU today with complaint of ripping her fingernail almost completely off the bed when she tried to catch a lamp and missed. Has acrylic nail tips on and caught on on the lamp. No pregnancy related complaints.   Receives care at [facility]. Prenatal records reviewed.  Pertinent items noted in HPI and remainder of comprehensive ROS otherwise negative.   O BP 121/77   Pulse 86   Temp 98.9 F (37.2 C)   Resp 18   Ht '5\' 2"'$  (1.575 m)   Wt 179 lb (81.2 kg)   LMP 11/16/2021 (Exact Date)   SpO2 99%   BMI 32.74 kg/m  Physical Exam Vitals and nursing note reviewed.  Constitutional:      Appearance: Normal appearance.  HENT:     Head: Normocephalic and atraumatic.  Eyes:     Pupils: Pupils are equal, round, and reactive to light.  Cardiovascular:     Rate and Rhythm: Normal rate and regular rhythm.  Pulmonary:     Effort: Pulmonary effort is normal.  Musculoskeletal:        General: Normal range of motion.  Skin:    General: Skin is warm.     Capillary Refill: Capillary refill takes less than 2 seconds.       Neurological:     Mental Status: She is alert and oriented to person, place, and time.  Psychiatric:        Mood and Affect: Mood normal.        Behavior: Behavior normal.    FHR: 135  MDM: Straightforward  MAU Course: Pt medically stable, applied two bandaids to fingernail and advised her to seek care at an Urgent Care (either now or in the morning)  A Partial avulsion of fingernail, initial encounter [redacted] weeks gestation of pregnancy Presence of fetal heart sounds in first trimester Medical screening exam complete  P Discharge from MAU in stable condition with first trimester precautions Follow up at [facility] as scheduled for ongoing prenatal care or UC for care of fingernail bed  Allergies as of 04/25/2022    No Known Allergies      Medication List     TAKE these medications    glycopyrrolate 1 MG tablet Commonly known as: Robinul Take 1 tablet (1 mg total) by mouth 3 (three) times daily.   hydrOXYzine 50 MG tablet Commonly known as: ATARAX Take 1 tablet (50 mg total) by mouth every 6 (six) hours as needed for anxiety.   mirtazapine 45 MG tablet Commonly known as: REMERON Take 1 tablet (45 mg total) by mouth at bedtime.   ondansetron 4 MG tablet Commonly known as: Zofran Take 1 tablet (4 mg total) by mouth every 6 (six) hours as needed.   oxyCODONE-acetaminophen 5-325 MG tablet Commonly known as: PERCOCET/ROXICET Take 1 tablet by mouth every 6 (six) hours as needed for severe pain.   Prenatal 27-1 MG Tabs Take 1 tablet by mouth daily.   pyridOXINE 25 MG tablet Commonly known as: VITAMIN B6 Take 1 tablet (25 mg total) by mouth 3 (three) times daily.   QUEtiapine 50 MG tablet Commonly known as: SEROQUEL Take 1 tablet (50 mg total) by mouth at bedtime as needed (sleep).   terconazole 0.4 % vaginal cream Commonly known as: TERAZOL 7 Place 1 applicator vaginally at bedtime. Use for seven days  Gabriel Carina, [place] 04/25/2022 12:32 AM

## 2022-04-25 NOTE — ED Triage Notes (Signed)
Pt states a lamp fell and she caught it and it bent back her left pinky nail. Pt has acrylic on them and she thinks it pulled her nail away from her skin.

## 2022-04-25 NOTE — MAU Note (Signed)
.  Victoria Garrett is a 22 y.o. at [redacted]w[redacted]d here in MAU reporting injury to her left pinky finger tonight. She was trying to catch a lamp and it hit her finger and she thinks pulled the nail almost off. Denies any pregnancy concerns.   Onset of complaint: 2330 Pain score: 6 Vitals:   04/25/22 0004 04/25/22 0005  BP:  121/77  Pulse: 86   Resp: 18   Temp: 98.9 F (37.2 C)   SpO2: 99%      FHT:135 Lab orders placed from triage: none

## 2022-04-26 ENCOUNTER — Encounter (HOSPITAL_COMMUNITY): Payer: Self-pay | Admitting: Obstetrics and Gynecology

## 2022-04-26 ENCOUNTER — Inpatient Hospital Stay (HOSPITAL_COMMUNITY)
Admission: AD | Admit: 2022-04-26 | Discharge: 2022-04-26 | Disposition: A | Discharge status: Home / Self Care | Payer: Medicaid Other | Attending: Obstetrics and Gynecology | Admitting: Obstetrics and Gynecology

## 2022-04-26 ENCOUNTER — Inpatient Hospital Stay (HOSPITAL_COMMUNITY): Payer: Medicaid Other

## 2022-04-26 DIAGNOSIS — Z3A20 20 weeks gestation of pregnancy: Secondary | ICD-10-CM | POA: Insufficient documentation

## 2022-04-26 DIAGNOSIS — O26892 Other specified pregnancy related conditions, second trimester: Secondary | ICD-10-CM | POA: Insufficient documentation

## 2022-04-26 DIAGNOSIS — O209 Hemorrhage in early pregnancy, unspecified: Secondary | ICD-10-CM | POA: Diagnosis present

## 2022-04-26 DIAGNOSIS — R300 Dysuria: Secondary | ICD-10-CM | POA: Insufficient documentation

## 2022-04-26 DIAGNOSIS — O99891 Other specified diseases and conditions complicating pregnancy: Secondary | ICD-10-CM | POA: Diagnosis not present

## 2022-04-26 DIAGNOSIS — M549 Dorsalgia, unspecified: Secondary | ICD-10-CM

## 2022-04-26 HISTORY — DX: Anxiety disorder, unspecified: F41.9

## 2022-04-26 HISTORY — DX: Depression, unspecified: F32.A

## 2022-04-26 HISTORY — DX: Unspecified abnormal cytological findings in specimens from vagina: R87.629

## 2022-04-26 LAB — CBC
HCT: 35.2 % — ABNORMAL LOW (ref 36.0–46.0)
Hemoglobin: 12.2 g/dL (ref 12.0–15.0)
MCH: 28.8 pg (ref 26.0–34.0)
MCHC: 34.7 g/dL (ref 30.0–36.0)
MCV: 83.2 fL (ref 80.0–100.0)
Platelets: 133 10*3/uL — ABNORMAL LOW (ref 150–400)
RBC: 4.23 MIL/uL (ref 3.87–5.11)
RDW: 12.6 % (ref 11.5–15.5)
WBC: 8.5 10*3/uL (ref 4.0–10.5)
nRBC: 0 % (ref 0.0–0.2)

## 2022-04-26 LAB — URINALYSIS, ROUTINE W REFLEX MICROSCOPIC
Bacteria, UA: NONE SEEN
Bilirubin Urine: NEGATIVE
Glucose, UA: NEGATIVE mg/dL
Hgb urine dipstick: NEGATIVE
Ketones, ur: 5 mg/dL — AB
Leukocytes,Ua: NEGATIVE
Nitrite: NEGATIVE
Protein, ur: 30 mg/dL — AB
Specific Gravity, Urine: 1.024 (ref 1.005–1.030)
pH: 5 (ref 5.0–8.0)

## 2022-04-26 LAB — COMPREHENSIVE METABOLIC PANEL
ALT: 19 U/L (ref 0–44)
AST: 16 U/L (ref 15–41)
Albumin: 3.3 g/dL — ABNORMAL LOW (ref 3.5–5.0)
Alkaline Phosphatase: 49 U/L (ref 38–126)
Anion gap: 11 (ref 5–15)
BUN: <5 mg/dL — ABNORMAL LOW (ref 6–20)
CO2: 22 mmol/L (ref 22–32)
Calcium: 9.4 mg/dL (ref 8.9–10.3)
Chloride: 104 mmol/L (ref 98–111)
Creatinine, Ser: 0.45 mg/dL (ref 0.44–1.00)
GFR, Estimated: >60 mL/min (ref >60)
Glucose, Bld: 107 mg/dL — ABNORMAL HIGH (ref 70–99)
Potassium: 3.5 mmol/L (ref 3.5–5.1)
Sodium: 137 mmol/L (ref 135–145)
Total Bilirubin: 0.6 mg/dL (ref 0.3–1.2)
Total Protein: 6.5 g/dL (ref 6.5–8.1)

## 2022-04-26 LAB — WET PREP, GENITAL
Clue Cells Wet Prep HPF POC: NONE SEEN
Sperm: NONE SEEN
Trich, Wet Prep: NONE SEEN
WBC, Wet Prep HPF POC: <10 (ref <10)
Yeast Wet Prep HPF POC: NONE SEEN

## 2022-04-26 NOTE — MAU Note (Signed)
Victoria Garrett is a 22 y.o. at 78w6dhere in MAU reporting: pain with urination this morning. Feels like really bad period cramps, and pressure. Having frequency, urgency.  Sometimes doesn't feel like she is emptying bladder.  Urine is very dark and has a strong odor.  Thinks there may be some blood when she wipes and saw in urine  Onset of complaint: this morning. Pain score: 10 Vitals:   04/26/22 0924  BP: 130/74  Pulse: (!) 110  Resp: 17  Temp: 98.4 F (36.9 C)  SpO2: 100%     FHT:143 Lab orders placed from triage:  urine

## 2022-04-26 NOTE — Discharge Instructions (Signed)

## 2022-04-26 NOTE — MAU Provider Note (Signed)
History     CSN: [id number]  Arrival date and time: 04/26/22 0908   None     Chief Complaint  Patient presents with   Dysuria   Vaginal Bleeding   HPI Victoria Garrett is a 22 y.o. G3P2002 at 60w6dwho presents to MAU for UTI symptoms. She reports lower abdominal cramping and pelvic pain started about 1 week ago but attributed it to normal pregnancy symptoms. She reports that she sometimes has to "curl up and hold my stomach" because it gets so bad. Pain radiates into right side and back. She started having burning with urination about 1.5 days ago along with urgency. She reports that she feels like she is not completely emptying her bladder. She reports when she wiped this morning she did see blood. She also has very dark urine with a bad odor. She denies fever, discharge or itching. Patient is concerned because in 2017 with her first pregnancy, she reports she had to have a nephrostomy tube because she "went into septic shock because she thinks she had a kidney infection".    Prenatal course: Cholelithiasis, gestational thrombocytopenia, suicide attempt by drug overdose, anxiety/depression. [facility] is scheduled [facility].     OB History     Gravida  3   Para  2   Term  2   Preterm      AB      Living  2      SAB      IAB      Ectopic      Multiple  0   Live Births  2           Past Medical History:  Diagnosis Date   Anemia    Anxiety    [place]   Plts 135 at new OB, then 127 at 28 weeks Check platelets at 36 week visit '[ ]'$     Depression    Heart murmur    as a child    Preterm uterine contractions in third trimester, antepartum 02/28/2020   Supervision of other normal pregnancy, antepartum 02/13/2020    Nursing Staff [place] Dating   6w 5d [place]  ENGLISH Anatomy [place]  WNL visible in Care everywhere 10/2019 Flu  Vaccine  02/14/2020 Genetic Screen  NIPS:   AFP:   First Screen:  Quad:   TDaP Vaccine   02/14/2020 Hgb A1C or  GTT Normal 1 hour [place]04/[place]ACC[place]   LAB RESULTS  Rhogam   Blood Type    B POS Feeding Plan BREASTFEED A   UTI in pregnancy 03/02/2020   Vs bacteriuria. Treated 1/14. '[ ]'$  TOC   Vaginal Pap smear, abnormal     Past Surgical History:  Procedure Laterality Date   IR NEPHROURETERAL CATH PLACE RIGHT Right 10/2015   NEPHROST[place]Y TU[place]ACEMENT [place]      Family History  Problem Relation Age of Onset   Healthy Mother    Healthy Father    Asthma Sister    Diabetes Maternal Grandmother     Social History   Tobacco Use   Smoking status: Never   Smokeless tobacco: Never  Vaping Use   Vaping Use: Former   Substances: Flavoring  Substance Use Topics   Alcohol use: Not Currently   Drug use: Not Currently    Types: Marijuana    Comment: LAST USED  MONTHS AGO    Allergies: No Known Allergies  Medications Prior to Admission  Medication Sig Dispense Refill Last Dose   glycopyrrolate (ROBINUL) 1 MG tablet Take 1 tablet (1 mg total) by mouth 3 (three) times daily. 90 tablet 3 04/26/2022   hydrOXYzine (ATARAX) 50 MG tablet Take 1 tablet (50 mg total) by mouth every 6 (six) hours as needed for anxiety. 60 tablet 0 Past Month   mirtazapine (REMERON) 45 MG tablet Take 1 tablet (45 mg total) by mouth at bedtime. 30 tablet 0 04/25/2022   ondansetron (ZOFRAN) 4 MG tablet Take 1 tablet (4 mg total) by mouth every 6 (six) hours as needed. 60 tablet 0 04/26/2022   Prenatal Vit-Fe Fumarate-FA (PRENATAL MULTIVITAMIN) TABS tablet Take 1 tablet by mouth daily. 30 tablet 0 04/25/2022   oxyCODONE-acetaminophen (PERCOCET/ROXICET) 5-325 MG tablet Take 1 tablet by mouth every 6 (six) hours as needed for severe pain. 20 tablet 0    pyridOXINE (VITAMIN B6) 25 MG tablet Take 1 tablet (25 mg total) by mouth 3 (three) times daily. (Patient not taking: Reported on  04/17/2022) 90 tablet 0    QUEtiapine (SEROQUEL) 50 MG tablet Take 1 tablet (50 mg total) by mouth at bedtime as needed (sleep). (Patient not taking: Reported on 04/17/2022) 30 tablet 0 More than a month   terconazole (TERAZOL 7) 0.4 % vaginal cream Place 1 applicator vaginally at bedtime. Use for seven days (Patient not taking: Reported on 04/17/2022) 45 g 0    Review of Systems  Constitutional: Negative.   Gastrointestinal:  Positive for abdominal pain (cramping).  Genitourinary:  Positive for dysuria, frequency, hematuria and urgency.  Musculoskeletal:  Positive for back pain.  All other systems reviewed and are negative.  Physical Exam  Patient Vitals for the past 24 hrs:  BP Temp Temp src Pulse Resp SpO2 Height Weight  04/26/22 0924 130/74 98.4 F (36.9 C) Oral (!) 110 17 100 % '5\' 2"'$  (1.575 m) 77.6 kg   Physical Exam Vitals and nursing note reviewed.  Constitutional:      General: She is not in acute distress. Eyes:     Extraocular Movements: Extraocular movements intact.     Pupils: Pupils are equal, round, and reactive to light.  Cardiovascular:     Rate and Rhythm: Tachycardia present.  Pulmonary:     Effort: Pulmonary effort is normal. No respiratory distress.  Abdominal:     Palpations: Abdomen is soft.     Tenderness: There is no abdominal tenderness. There is no right CVA tenderness or left CVA tenderness.  Musculoskeletal:        General: Normal range of motion.     Cervical back: Normal range of motion.  Skin:    General: Skin is warm and dry.  Neurological:     General: No focal deficit present.     Mental Status: She is alert and oriented to person, place, and time.  Psychiatric:        Mood and Affect: Mood normal.        Behavior: Behavior normal.    Dilation: Closed Effacement (%): Thick Exam by:: Maryagnes Amos, CNM  FHR: 143 bpm via doppler  Results for orders placed or performed during the hospital encounter of 04/26/22 (from the past 24 hour(s))   Urinalysis, Routine w reflex microscopic -Urine, Clean Catch     Status: Abnormal   Collection Time: 04/26/22  9:39 AM  Result Value Ref Range   Color, Urine YELLOW YELLOW   APPearance  HAZY (A) CLEAR   Specific Gravity, Urine 1.024 1.005 - 1.030   pH 5.0 5.0 - 8.0   Glucose, UA NEGATIVE NEGATIVE mg/dL   Hgb urine dipstick NEGATIVE NEGATIVE   Bilirubin Urine NEGATIVE NEGATIVE   Ketones, ur 5 (A) NEGATIVE mg/dL   Protein, ur 30 (A) NEGATIVE mg/dL   Nitrite NEGATIVE NEGATIVE   Leukocytes,Ua NEGATIVE NEGATIVE   RBC / HPF 0-5 0 - 5 RBC/hpf   WBC, UA 0-5 0 - 5 WBC/hpf   Bacteria, UA NONE SEEN NONE SEEN   Squamous Epithelial / HPF 0-5 0 - 5 /HPF   Mucus PRESENT   CBC     Status: Abnormal   Collection Time: 04/26/22 10:07 AM  Result Value Ref Range   WBC 8.5 4.0 - 10.5 K/uL   RBC 4.23 3.87 - 5.11 MIL/uL   Hemoglobin 12.2 12.0 - 15.0 g/dL   HCT 35.2 (L) 36.0 - 46.0 %   MCV 83.2 80.0 - 100.0 fL   MCH 28.8 26.0 - 34.0 pg   MCHC 34.7 30.0 - 36.0 g/dL   RDW 12.6 11.5 - 15.5 %   Platelets 133 (L) 150 - 400 K/uL   nRBC 0.0 0.0 - 0.2 %  Comprehensive metabolic panel     Status: Abnormal   Collection Time: 04/26/22 10:07 AM  Result Value Ref Range   Sodium 137 135 - 145 mmol/L   Potassium 3.5 3.5 - 5.1 mmol/L   Chloride 104 98 - 111 mmol/L   CO2 22 22 - 32 mmol/L   Glucose, Bld 107 (H) 70 - 99 mg/dL   BUN <5 (L) 6 - 20 mg/dL   Creatinine, Ser 0.45 0.44 - 1.00 mg/dL   Calcium 9.4 8.9 - 10.3 mg/dL   Total Protein 6.5 6.5 - 8.1 g/dL   Albumin 3.3 (L) 3.5 - 5.0 g/dL   AST 16 15 - 41 U/L   ALT 19 0 - 44 U/L   Alkaline Phosphatase 49 38 - 126 U/L   Total Bilirubin 0.6 0.3 - 1.2 mg/dL   GFR, Estimated >60 >60 mL/min   Anion gap 11 5 - 15  Wet prep, genital     Status: None   Collection Time: 04/26/22 11:35 AM   Specimen: Vaginal  Result Value Ref Range   Yeast Wet Prep HPF POC NONE SEEN NONE SEEN   Trich, Wet Prep NONE SEEN NONE SEEN   Clue Cells Wet Prep HPF POC NONE SEEN NONE  SEEN   WBC, Wet Prep HPF POC <10 <10   Sperm NONE SEEN    US RENAL  Result Date: 04/26/2022 CLINICAL DATA:  22 year old pregnant female with flank pain. EXAM: RENAL / URINARY TRACT ULTRASOUND COMPLETE COMPARISON:  Right upper quadrant ultrasound 04/21/2022. CT Abdomen and Pelvis 12/04/2020. FINDINGS: Right Kidney: Renal measurements: 11.3 x 5.2 x 5.6 cm = volume: 172 mL. Echogenicity within normal limits. No mass or hydronephrosis visualized. Left Kidney: Renal measurements: 11.8 x 5.5 x 6.0 cm = volume: 203 mL. Echogenicity within normal limits. No mass or hydronephrosis visualized. Bladder: Relatively normal bladder volume. Questionable echogenic debris in the bladder (image 40) versus artifact. Other: Echogenic liver (image 15). IMPRESSION: 1. Normal ultrasound appearance of the kidneys. 2. Floating debris versus artifact within in the bladder. Recommend correlation with Urinalysis. Electronically Signed   By: Genevie Ann M.D.   On: 04/26/2022 11:15    MAU Course  Procedures  MDM UA, culture CBC, CMP Renal US Wet prep, GC/CT  CBC and  CMP normal. UA unremarkable. Culture added on given urinary symptoms. Renal US shows some debris vs artifact but otherwise normal, no concerns for kidney stones. Wet prep negative, GC/CT pending. Cervix closed. No CVA tenderness. Patient afebrile. Low suspicion for preterm labor, pyelonephritis, or kidney stone. Patient offered Tylenol but declines.   Assessment and Plan   1. [redacted] weeks gestation of pregnancy   2. Back pain affecting pregnancy in second trimester   3. Dysuria during pregnancy in second trimester    - Discharge home in stable condition  - Strict return precautions reviewed. Encouraged increased PO water intake  - Return to MAU as needed for new/worsening symptoms - Keep OB appointment as scheduled   Renee Harder, CNM 04/26/2022, 12:10 PM

## 2022-04-27 LAB — CULTURE, OB URINE: Culture: <10000 — AB

## 2022-04-28 LAB — GC/CHLAMYDIA PROBE AMP (~~LOC~~) NOT AT ARMC
Chlamydia: NEGATIVE
Comment: NEGATIVE
Comment: NORMAL
Neisseria Gonorrhea: NEGATIVE

## 2022-05-03 ENCOUNTER — Other Ambulatory Visit: Payer: Self-pay

## 2022-05-05 ENCOUNTER — Other Ambulatory Visit (HOSPITAL_COMMUNITY): Payer: Self-pay

## 2022-05-05 ENCOUNTER — Inpatient Hospital Stay (HOSPITAL_COMMUNITY)
Admission: AD | Admit: 2022-05-05 | Discharge: 2022-05-05 | Disposition: A | Discharge status: Home / Self Care | Payer: Medicaid Other | Attending: Family Medicine | Admitting: Family Medicine

## 2022-05-05 ENCOUNTER — Encounter (HOSPITAL_COMMUNITY): Payer: Self-pay | Admitting: Family Medicine

## 2022-05-05 DIAGNOSIS — Z3A22 22 weeks gestation of pregnancy: Secondary | ICD-10-CM | POA: Diagnosis not present

## 2022-05-05 DIAGNOSIS — O210 Mild hyperemesis gravidarum: Secondary | ICD-10-CM | POA: Diagnosis not present

## 2022-05-05 DIAGNOSIS — Z3480 Encounter for supervision of other normal pregnancy, unspecified trimester: Secondary | ICD-10-CM

## 2022-05-05 DIAGNOSIS — K117 Disturbances of salivary secretion: Secondary | ICD-10-CM

## 2022-05-05 LAB — COMPREHENSIVE METABOLIC PANEL
ALT: 18 U/L (ref 0–44)
AST: 14 U/L — ABNORMAL LOW (ref 15–41)
Albumin: 3.5 g/dL (ref 3.5–5.0)
Alkaline Phosphatase: 56 U/L (ref 38–126)
Anion gap: 8 (ref 5–15)
BUN: <5 mg/dL — ABNORMAL LOW (ref 6–20)
CO2: 22 mmol/L (ref 22–32)
Calcium: 9.1 mg/dL (ref 8.9–10.3)
Chloride: 106 mmol/L (ref 98–111)
Creatinine, Ser: 0.43 mg/dL — ABNORMAL LOW (ref 0.44–1.00)
GFR, Estimated: >60 mL/min (ref >60)
Glucose, Bld: 84 mg/dL (ref 70–99)
Potassium: 3.4 mmol/L — ABNORMAL LOW (ref 3.5–5.1)
Sodium: 136 mmol/L (ref 135–145)
Total Bilirubin: 0.5 mg/dL (ref 0.3–1.2)
Total Protein: 7.1 g/dL (ref 6.5–8.1)

## 2022-05-05 LAB — CBC
HCT: 38.8 % (ref 36.0–46.0)
Hemoglobin: 12.9 g/dL (ref 12.0–15.0)
MCH: 28.3 pg (ref 26.0–34.0)
MCHC: 33.2 g/dL (ref 30.0–36.0)
MCV: 85.1 fL (ref 80.0–100.0)
Platelets: 122 10*3/uL — ABNORMAL LOW (ref 150–400)
RBC: 4.56 MIL/uL (ref 3.87–5.11)
RDW: 12.7 % (ref 11.5–15.5)
WBC: 8 10*3/uL (ref 4.0–10.5)
nRBC: 0 % (ref 0.0–0.2)

## 2022-05-05 MED ORDER — LACTATED RINGERS IV BOLUS
1000.0000 mL | Freq: Once | INTRAVENOUS | Status: AC
  Administered 2022-05-05: 1000 mL via INTRAVENOUS

## 2022-05-05 MED ORDER — ONDANSETRON HCL 4 MG/2ML IJ SOLN
4.0000 mg | Freq: Once | INTRAMUSCULAR | Status: AC
  Administered 2022-05-05: 4 mg via INTRAVENOUS
  Filled 2022-05-05: qty 2

## 2022-05-05 MED ORDER — GLYCOPYRROLATE 0.2 MG/ML IJ SOLN
0.2000 mg | Freq: Once | INTRAMUSCULAR | Status: AC
  Administered 2022-05-05: 0.2 mg via INTRAVENOUS
  Filled 2022-05-05: qty 1

## 2022-05-05 MED ORDER — ONDANSETRON 4 MG PO TBDP: 4.0000 mg | ORAL_TABLET | Freq: Four times a day (QID) | ORAL | 3 refills | Status: DC | PRN

## 2022-05-05 MED ORDER — GLYCOPYRROLATE 1 MG PO TABS: 1.0000 mg | ORAL_TABLET | Freq: Three times a day (TID) | ORAL | 3 refills | Status: DC

## 2022-05-05 NOTE — MAU Provider Note (Signed)
History     CSN: [id number]  Arrival date and time: 05/05/22 [id number]   Event Date/Time   First Provider Initiated Contact with Patient 05/05/22 [phone]      Chief Complaint  Patient presents with   Emesis   HPI This is a 22 year old G3 P2-0-0-2 at 22 weeks and 1 day who presents with vomiting over the weekend.  She has hyperemesis gravidarum, which has been complicated by pregnancy.  She has been on Zofran and Robinul with great improvement of her symptoms.  She she presents today because she ran out of her medications at the end of the week and has been unable to get a refill of these medications.  Since running out of her medications, she has not been able to tolerate food or liquid; every time she eats or drinks something she immediately vomits.  She denies fevers, chills, abdominal pain, chest pain, shortness of breath.  She has good fetal movement.  No vaginal bleeding or loss of fluid.   OB History     Gravida  3   Para  2   Term  2   Preterm      AB      Living  2      SAB      IAB      Ectopic      Multiple  0   Live Births  2           Past Medical History:  Diagnosis Date   Anemia    Anxiety    Benign gestational thrombocytopenia in third trimester ([place]) 02/28/2020   Plts 135 at new OB, then 127 at 28 weeks Check platelets at 36 week visit [ ]     Depression    Heart murmur    as a child    Preterm uterine contractions in third trimester, antepartum 02/28/2020   Supervision of other normal pregnancy, antepartum 02/13/2020    Nursing Staff Provider Office Location [place]  Dating   6w 5d [place] Language  ENGLISH Anatomy US   WNL visible in Care everywhere 10/2019 Flu Vaccine  02/14/2020 Genetic Screen  NIPS:   AFP:   First Screen:  Quad:   TDaP Vaccine   02/14/2020 Hgb A1C or  GTT Normal 1 hour 01/04/2020 per [facility] Vaccine NOT VACCINATED   LAB RESULTS  Rhogam   Blood Type    B POS Feeding Plan BREASTFEED A   UTI in pregnancy 03/02/2020   Vs  bacteriuria. Treated 1/14. [ ]  TOC   Vaginal Pap smear, abnormal     Past Surgical History:  Procedure Laterality Date   IR NEPHROURETERAL CATH PLACE RIGHT Right 10/2015   NEPHROSTOMY TUBE PLACEMENT ([place] HX)      Family History  Problem Relation Age of Onset   Healthy Mother    Healthy Father    Asthma Sister    Diabetes Maternal Grandmother     Social History   Tobacco Use   Smoking status: Never   Smokeless tobacco: Never  Vaping Use   Vaping Use: Former   Substances: Flavoring  Substance Use Topics   Alcohol use: Not Currently   Drug use: Not Currently    Types: Marijuana    Comment: LAST USED MONTHS AGO    Allergies: No Known Allergies  Medications Prior to Admission  Medication Sig Dispense Refill Last Dose   hydrOXYzine (ATARAX) 50 MG tablet Take 1 tablet (50 mg total) by mouth every 6 (six) hours  as needed for anxiety. 60 tablet 0 Past Month   mirtazapine (REMERON) 45 MG tablet Take 1 tablet (45 mg total) by mouth at bedtime. 30 tablet 0 05/04/2022   ondansetron (ZOFRAN) 4 MG tablet Take 1 tablet (4 mg total) by mouth every 6 (six) hours as needed. 60 tablet 0 Past Week   Prenatal Vit-Fe Fumarate-FA (PRENATAL MULTIVITAMIN) TABS tablet Take 1 tablet by mouth daily. 30 tablet 0 05/04/2022   [DISCONTINUED] glycopyrrolate (ROBINUL) 1 MG tablet Take 1 tablet (1 mg total) by mouth 3 (three) times daily. 90 tablet 3 Past Week   oxyCODONE-acetaminophen (PERCOCET/ROXICET) 5-325 MG tablet Take 1 tablet by mouth every 6 (six) hours as needed for severe pain. 20 tablet 0    pyridOXINE (VITAMIN B6) 25 MG tablet Take 1 tablet (25 mg total) by mouth 3 (three) times daily. (Patient not taking: Reported on 04/17/2022) 90 tablet 0    QUEtiapine (SEROQUEL) 50 MG tablet Take 1 tablet (50 mg total) by mouth at bedtime as needed (sleep). (Patient not taking: Reported on 04/17/2022) 30 tablet 0    terconazole (TERAZOL 7) 0.4 % vaginal cream Place 1 applicator vaginally at bedtime. Use for  seven days (Patient not taking: Reported on 04/17/2022) 45 g 0     Review of Systems Physical Exam   Blood pressure 125/69, pulse 93, temperature 98.2 F (36.8 C), resp. rate 18, last menstrual period 11/16/2021.  Patient Vitals for the past 24 hrs:  BP Temp Pulse Resp  05/05/22 0834 125/69 98.2 F (36.8 C) 93 18     Physical Exam Vitals reviewed. Exam conducted with a chaperone present.  Constitutional:      Appearance: Normal appearance.  Cardiovascular:     Rate and Rhythm: Normal rate.  Pulmonary:     Effort: Pulmonary effort is normal.  Abdominal:     General: Abdomen is flat.     Palpations: Abdomen is soft.     Tenderness: There is no abdominal tenderness.     Hernia: No hernia is present.  Skin:    General: Skin is warm and dry.     Capillary Refill: Capillary refill takes less than 2 seconds.  Neurological:     General: No focal deficit present.     Mental Status: She is alert.  Psychiatric:        Mood and Affect: Mood normal.        Behavior: Behavior normal.        Thought Content: Thought content normal.        Judgment: Judgment normal.    Results for orders placed or performed during the hospital encounter of 05/05/22 (from the past 24 hour(s))  Comprehensive metabolic panel     Status: Abnormal   Collection Time: 05/05/22  9:14 AM  Result Value Ref Range   Sodium 136 135 - 145 mmol/L   Potassium 3.4 (L) 3.5 - 5.1 mmol/L   Chloride 106 98 - 111 mmol/L   CO2 22 22 - 32 mmol/L   Glucose, Bld 84 70 - 99 mg/dL   BUN <5 (L) 6 - 20 mg/dL   Creatinine, Ser 0.43 (L) 0.44 - 1.00 mg/dL   Calcium 9.1 8.9 - 10.3 mg/dL   Total Protein 7.1 6.5 - 8.1 g/dL   Albumin 3.5 3.5 - 5.0 g/dL   AST 14 (L) 15 - 41 U/L   ALT 18 0 - 44 U/L   Alkaline Phosphatase 56 38 - 126 U/L   Total Bilirubin 0.5 0.3 -  1.2 mg/dL   GFR, Estimated >60 >60 mL/min   Anion gap 8 5 - 15  CBC     Status: Abnormal   Collection Time: 05/05/22  9:14 AM  Result Value Ref Range   WBC 8.0  4.0 - 10.5 K/uL   RBC 4.56 3.87 - 5.11 MIL/uL   Hemoglobin 12.9 12.0 - 15.0 g/dL   HCT 38.8 36.0 - 46.0 %   MCV 85.1 80.0 - 100.0 fL   MCH 28.3 26.0 - 34.0 pg   MCHC 33.2 30.0 - 36.0 g/dL   RDW 12.7 11.5 - 15.5 %   Platelets 122 (L) 150 - 400 K/uL   nRBC 0.0 0.0 - 0.2 %    No results found.   MAU Course  Procedures  MDM Patient improved after IVF, zofran and IV glycopyrrolate.   Assessment and Plan   1. Supervision of other normal pregnancy, antepartum   2. [redacted] weeks gestation of pregnancy   3. Hyperemesis gravidarum   4. Hypersalivation    Refilled medications. F/u with OB>  Truett Mainland 05/05/2022, 11:51 AM

## 2022-05-05 NOTE — MAU Note (Signed)
.  Vanita Gensemer is a 22 y.o. at [redacted]w[redacted]d here in MAU reporting: has had N/V since Sat. Ran out of zofran and robinul. Requested refills through my chart but has not been addressed.Has not ben able to keep food or fluids down all weekend. Unable to give a urin sample at this time.   Onset of complaint: Sat Pain score: 0 Vitals:   05/05/22 0834  BP: 125/69  Pulse: 93  Resp: 18  Temp: 98.2 F (36.8 C)     FHT:141 Lab orders placed from triage:

## 2022-05-15 ENCOUNTER — Other Ambulatory Visit (HOSPITAL_COMMUNITY): Payer: Self-pay

## 2022-05-15 ENCOUNTER — Ambulatory Visit (INDEPENDENT_AMBULATORY_CARE_PROVIDER_SITE_OTHER): Payer: Medicaid Other | Admitting: *Deleted

## 2022-05-15 VITALS — BP 117/75 | HR 92 | Temp 98.6°F | Wt 175.4 lb

## 2022-05-15 DIAGNOSIS — Z3A23 23 weeks gestation of pregnancy: Secondary | ICD-10-CM

## 2022-05-15 DIAGNOSIS — R300 Dysuria: Secondary | ICD-10-CM

## 2022-05-15 DIAGNOSIS — O26892 Other specified pregnancy related conditions, second trimester: Secondary | ICD-10-CM

## 2022-05-15 DIAGNOSIS — Z3480 Encounter for supervision of other normal pregnancy, unspecified trimester: Secondary | ICD-10-CM

## 2022-05-15 DIAGNOSIS — O2342 Unspecified infection of urinary tract in pregnancy, second trimester: Secondary | ICD-10-CM

## 2022-05-15 LAB — POCT URINALYSIS DIPSTICK
Bilirubin, UA: NEGATIVE
Blood, UA: NEGATIVE
Glucose, UA: NEGATIVE
Ketones, UA: NEGATIVE
Nitrite, UA: NEGATIVE
Odor: NORMAL
Protein, UA: NEGATIVE
Spec Grav, UA: <=1.005 — AB (ref 1.010–1.025)
Urobilinogen, UA: 0.2 E.U./dL
pH, UA: 6.5 (ref 5.0–8.0)

## 2022-05-15 MED ORDER — PHENAZOPYRIDINE HCL 200 MG PO TABS: 200.0000 mg | ORAL_TABLET | Freq: Three times a day (TID) | ORAL | 1 refills | Status: DC | PRN

## 2022-05-15 MED ORDER — CEPHALEXIN 500 MG PO CAPS: 500.0000 mg | ORAL_CAPSULE | Freq: Three times a day (TID) | ORAL | 0 refills | Status: DC

## 2022-05-15 NOTE — Progress Notes (Signed)
SUBJECTIVE: Victoria Garrett is a 22 y.o. female who complains of urinary frequency, urgency and dysuria x weeks, without flank pain, fever, chills, or abnormal vaginal discharge or bleeding. Hx of pyelo and renal stones previous pregnancy.  OBJECTIVE: Appears well, in no apparent distress.  Vital signs are normal. Urine dipstick shows positive for leukocytes.    ASSESSMENT: Dysuria  PLAN: Treatment per orders.  Call or return to clinic prn if these symptoms worsen or fail to improve as anticipated.  Pt is 22.[redacted] wks GA. OB Urine sent. RX Keflex and Pyridium per v.o. Dr. Rip Harbour.

## 2022-05-19 ENCOUNTER — Ambulatory Visit (INDEPENDENT_AMBULATORY_CARE_PROVIDER_SITE_OTHER): Payer: Medicaid Other | Admitting: Obstetrics and Gynecology

## 2022-05-19 VITALS — BP 108/71 | HR 93 | Wt 177.0 lb

## 2022-05-19 DIAGNOSIS — F4310 Post-traumatic stress disorder, unspecified: Secondary | ICD-10-CM

## 2022-05-19 DIAGNOSIS — Z3480 Encounter for supervision of other normal pregnancy, unspecified trimester: Secondary | ICD-10-CM

## 2022-05-19 DIAGNOSIS — T50902A Poisoning by unspecified drugs, medicaments and biological substances, intentional self-harm, initial encounter: Secondary | ICD-10-CM

## 2022-05-19 DIAGNOSIS — Z3A24 24 weeks gestation of pregnancy: Secondary | ICD-10-CM

## 2022-05-19 DIAGNOSIS — Z3009 Encounter for other general counseling and advice on contraception: Secondary | ICD-10-CM

## 2022-05-19 DIAGNOSIS — F329 Major depressive disorder, single episode, unspecified: Secondary | ICD-10-CM

## 2022-05-19 DIAGNOSIS — Z3482 Encounter for supervision of other normal pregnancy, second trimester: Secondary | ICD-10-CM

## 2022-05-19 DIAGNOSIS — R87810 Cervical high risk human papillomavirus (HPV) DNA test positive: Secondary | ICD-10-CM

## 2022-05-19 DIAGNOSIS — F410 Panic disorder [episodic paroxysmal anxiety] without agoraphobia: Secondary | ICD-10-CM

## 2022-05-19 DIAGNOSIS — O43192 Other malformation of placenta, second trimester: Secondary | ICD-10-CM

## 2022-05-19 DIAGNOSIS — O99342 Other mental disorders complicating pregnancy, second trimester: Secondary | ICD-10-CM

## 2022-05-19 DIAGNOSIS — O43199 Other malformation of placenta, unspecified trimester: Secondary | ICD-10-CM

## 2022-05-19 DIAGNOSIS — F411 Generalized anxiety disorder: Secondary | ICD-10-CM

## 2022-05-19 DIAGNOSIS — R87610 Atypical squamous cells of undetermined significance on cytologic smear of cervix (ASC-US): Secondary | ICD-10-CM

## 2022-05-19 DIAGNOSIS — O99340 Other mental disorders complicating pregnancy, unspecified trimester: Secondary | ICD-10-CM

## 2022-05-19 LAB — URINE CULTURE, OB REFLEX

## 2022-05-19 LAB — CULTURE, OB URINE

## 2022-05-19 NOTE — Progress Notes (Signed)
   PRENATAL VISIT NOTE  Subjective:  Victoria Garrett is a 22 y.o. G3P2002 at [redacted]w[redacted]d being seen today for ongoing prenatal care.  She is currently monitored for the following issues for this high-risk pregnancy and has Supervision of other normal pregnancy, antepartum; Generalized anxiety disorder with panic attacks; Major depressive disorder affecting pregnancy; Poor social situation; History of pyelonephritis during pregnancy; Hydronephrosis, right; PTSD (post-traumatic stress disorder); Deliberate self-cutting; ASCUS with positive high risk HPV cervical; Severe recurrent major depression without psychotic features; and Suicide attempt by drug overdose on their problem list.  Patient reports  continues to have spitting but is able to tolerate some po intake .  Contractions: Not present. Vag. Bleeding: None.  Movement: Present. Denies leaking of fluid.   The following portions of the patient's history were reviewed and updated as appropriate: allergies, current medications, past family history, past medical history, past social history, past surgical history and problem list.   Objective:   Vitals:   05/19/22 0949  BP: 108/71  Pulse: 93  Weight: 177 lb (80.3 kg)   Fetal Status: Fetal Heart Rate (bpm): 130   Movement: Present     General:  Alert, oriented and cooperative. Patient is in no acute distress.  Skin: Skin is warm and dry. No rash noted.   Cardiovascular: Normal heart rate noted  Respiratory: Normal respiratory effort, no problems with respiration noted  Abdomen: Soft, gravid, appropriate for gestational age.  Pain/Pressure: Absent      Assessment and Plan:  Pregnancy: G3P2002 at [redacted]w[redacted]d 1. Encounter for supervision of other normal pregnancy in second trimester 2. [redacted] weeks gestation of pregnancy 8. Marginal cord insertion NIPS today. Discussed 2h, CBC, HIV, RPR & tdap next appt w/ need for fasting prior to appt On chart review after visit, realized that our problem list  was pulling OB Panel results from a prior pregnancy. She was negative for HIV, hep B & HCV in August (~8 weeks prior to this pregnancy). She will need a full new OB panel at her next appointment - added to sticky note - [facility] Prenatal Test Full Panel  3. ASCUS with positive high risk HPV cervical Due for pap  4. Suicide attempt by drug overdose 5. Generalized anxiety disorder with panic attacks 6. Major depressive disorder affecting pregnancy 7. PTSD (post-traumatic stress disorder) Self discontinued her remeron and reports that her mood is fine Following with therapist  9. Sterilization consult Reviewed that sterilization is permanent/not reversible. Discussed that she does not need to be a certain age or have a certain number of children to be able to get permanent sterilization, but that her age puts her at higher risk of regret compared to someone who is older. We also reviewed that surgery carries it's own risks including bleeding, infection, or injury to internal organs. We discussed that LARC is equally effective and has the benefit of being reversible. She is going to consider her options.   Return in about 4 weeks (around 06/16/2022) for return OB at 28 weeks with 2h GTT, CBC, HIV, RPR and tdap.  Future Appointments  Date Time Provider [place]  06/12/2022  9:15 AM [facility] NURSE [facility]  06/12/2022  9:30 AM [facility] US3 [facility]  06/17/2022  9:00 AM [facility] LAB [facility] None  06/17/2022 10:35 AM Deloris Ping, CNM [facility] None    Inez Catalina, MD

## 2022-05-19 NOTE — Patient Instructions (Signed)
Your next visit will be a long one - we do the test for gestational diabetes, anemia, HIV and syphilis. You will also get the tdap (whooping cough & tetanus) shot. Please don't eat or drink anything besides water before that appointment.

## 2022-05-20 ENCOUNTER — Other Ambulatory Visit: Payer: Self-pay

## 2022-05-20 DIAGNOSIS — O2342 Unspecified infection of urinary tract in pregnancy, second trimester: Secondary | ICD-10-CM

## 2022-05-20 IMAGING — DX DG CHEST 1V PORT
1 series · 1 of 1 positions shown · non-contrast
Comparison: None.

CLINICAL DATA: Shortness of breath

EXAM:
PORTABLE CHEST 1 VIEW

[chest ap]
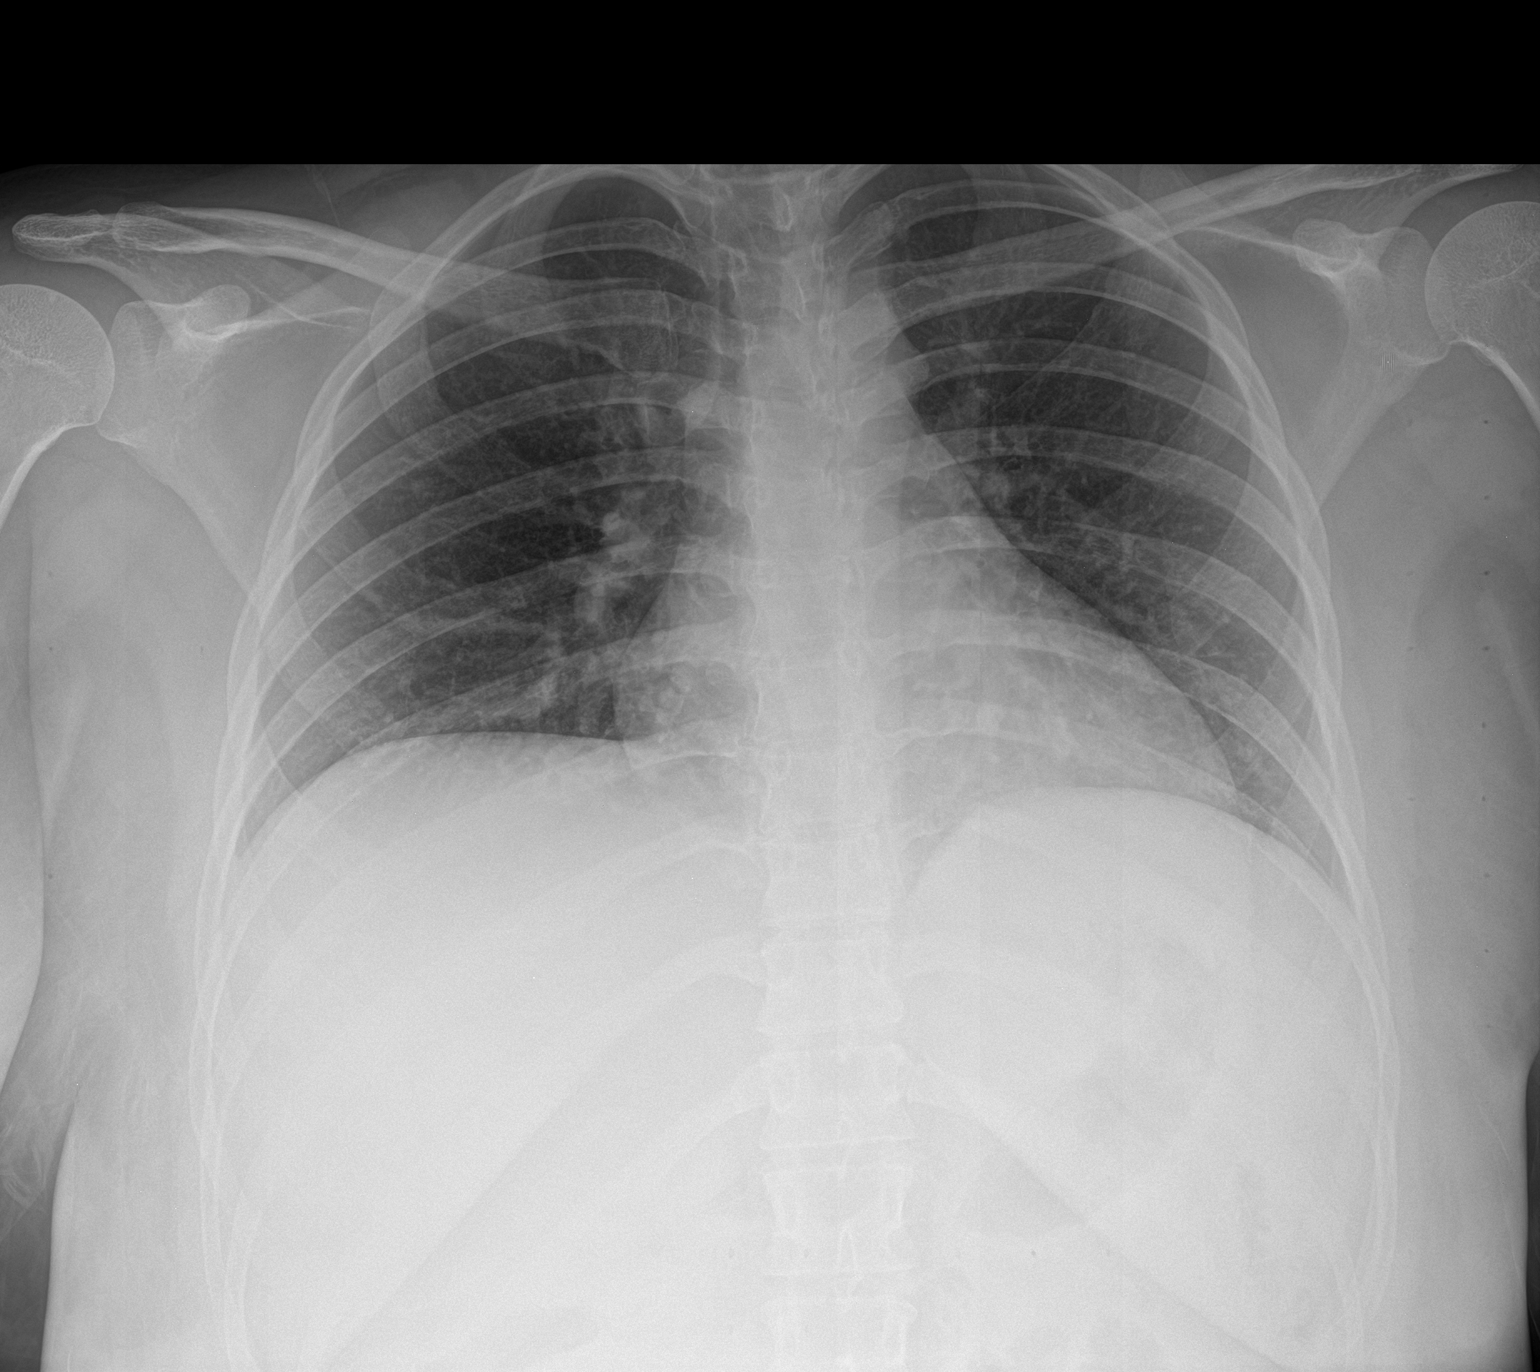

[1 of 1 positions shown; findings below may reference images not displayed]

FINDINGS: The heart size and mediastinal contours are within normal limits.
Both lungs are clear. The visualized skeletal structures are
unremarkable.
IMPRESSION: No active disease.

## 2022-05-20 MED ORDER — NITROFURANTOIN MONOHYD MACRO 100 MG PO CAPS: 100.0000 mg | ORAL_CAPSULE | Freq: Two times a day (BID) | ORAL | 1 refills | Status: DC

## 2022-05-25 LAB — PANORAMA PRENATAL TEST FULL PANEL:PANORAMA TEST PLUS 5 ADDITIONAL MICRODELETIONS: FETAL FRACTION: 17.7

## 2022-05-27 ENCOUNTER — Encounter (HOSPITAL_COMMUNITY): Payer: Self-pay | Admitting: Obstetrics and Gynecology

## 2022-05-27 ENCOUNTER — Inpatient Hospital Stay (HOSPITAL_COMMUNITY): Payer: Medicaid Other

## 2022-05-27 ENCOUNTER — Inpatient Hospital Stay (HOSPITAL_COMMUNITY)
Admission: AD | Admit: 2022-05-27 | Discharge: 2022-05-27 | Disposition: A | Discharge status: Home / Self Care | Payer: Medicaid Other | Attending: Obstetrics and Gynecology | Admitting: Obstetrics and Gynecology

## 2022-05-27 ENCOUNTER — Telehealth: Payer: Self-pay | Admitting: *Deleted

## 2022-05-27 DIAGNOSIS — O99891 Other specified diseases and conditions complicating pregnancy: Secondary | ICD-10-CM | POA: Insufficient documentation

## 2022-05-27 DIAGNOSIS — Z3480 Encounter for supervision of other normal pregnancy, unspecified trimester: Secondary | ICD-10-CM

## 2022-05-27 DIAGNOSIS — O219 Vomiting of pregnancy, unspecified: Secondary | ICD-10-CM

## 2022-05-27 DIAGNOSIS — O26892 Other specified pregnancy related conditions, second trimester: Secondary | ICD-10-CM | POA: Insufficient documentation

## 2022-05-27 DIAGNOSIS — Z3A25 25 weeks gestation of pregnancy: Secondary | ICD-10-CM | POA: Diagnosis not present

## 2022-05-27 DIAGNOSIS — O212 Late vomiting of pregnancy: Secondary | ICD-10-CM | POA: Insufficient documentation

## 2022-05-27 DIAGNOSIS — R109 Unspecified abdominal pain: Secondary | ICD-10-CM

## 2022-05-27 DIAGNOSIS — Z3689 Encounter for other specified antenatal screening: Secondary | ICD-10-CM

## 2022-05-27 LAB — URINALYSIS, ROUTINE W REFLEX MICROSCOPIC
Bilirubin Urine: NEGATIVE
Glucose, UA: NEGATIVE mg/dL
Hgb urine dipstick: NEGATIVE
Ketones, ur: 80 mg/dL — AB
Nitrite: NEGATIVE
Protein, ur: 30 mg/dL — AB
Specific Gravity, Urine: 1.02 (ref 1.005–1.030)
pH: 6 (ref 5.0–8.0)

## 2022-05-27 LAB — COMPREHENSIVE METABOLIC PANEL
ALT: 16 U/L (ref 0–44)
AST: 13 U/L — ABNORMAL LOW (ref 15–41)
Albumin: 3.2 g/dL — ABNORMAL LOW (ref 3.5–5.0)
Alkaline Phosphatase: 59 U/L (ref 38–126)
Anion gap: 11 (ref 5–15)
BUN: <5 mg/dL — ABNORMAL LOW (ref 6–20)
CO2: 19 mmol/L — ABNORMAL LOW (ref 22–32)
Calcium: 8.7 mg/dL — ABNORMAL LOW (ref 8.9–10.3)
Chloride: 105 mmol/L (ref 98–111)
Creatinine, Ser: 0.39 mg/dL — ABNORMAL LOW (ref 0.44–1.00)
GFR, Estimated: >60 mL/min (ref >60)
Glucose, Bld: 70 mg/dL (ref 70–99)
Potassium: 3.2 mmol/L — ABNORMAL LOW (ref 3.5–5.1)
Sodium: 135 mmol/L (ref 135–145)
Total Bilirubin: 0.7 mg/dL (ref 0.3–1.2)
Total Protein: 6 g/dL — ABNORMAL LOW (ref 6.5–8.1)

## 2022-05-27 LAB — CBC WITH DIFFERENTIAL/PLATELET
Abs Immature Granulocytes: 0.05 10*3/uL (ref 0.00–0.07)
Basophils Absolute: 0 10*3/uL (ref 0.0–0.1)
Basophils Relative: 0 %
Eosinophils Absolute: 0 10*3/uL (ref 0.0–0.5)
Eosinophils Relative: 0 %
HCT: 33.4 % — ABNORMAL LOW (ref 36.0–46.0)
Hemoglobin: 11.7 g/dL — ABNORMAL LOW (ref 12.0–15.0)
Immature Granulocytes: 1 %
Lymphocytes Relative: 26 %
Lymphs Abs: 2 10*3/uL (ref 0.7–4.0)
MCH: 28.5 pg (ref 26.0–34.0)
MCHC: 35 g/dL (ref 30.0–36.0)
MCV: 81.3 fL (ref 80.0–100.0)
Monocytes Absolute: 0.4 10*3/uL (ref 0.1–1.0)
Monocytes Relative: 6 %
Neutro Abs: 5.2 10*3/uL (ref 1.7–7.7)
Neutrophils Relative %: 67 %
Platelets: 105 10*3/uL — ABNORMAL LOW (ref 150–400)
RBC: 4.11 MIL/uL (ref 3.87–5.11)
RDW: 13.1 % (ref 11.5–15.5)
WBC: 7.7 10*3/uL (ref 4.0–10.5)
nRBC: 0 % (ref 0.0–0.2)

## 2022-05-27 MED ORDER — ONDANSETRON HCL 4 MG/2ML IJ SOLN: 4.0000 mg | Freq: Once | INTRAMUSCULAR | Status: DC

## 2022-05-27 MED ORDER — LACTATED RINGERS IV BOLUS
1000.0000 mL | Freq: Once | INTRAVENOUS | Status: AC
  Administered 2022-05-27: 1000 mL via INTRAVENOUS

## 2022-05-27 MED ORDER — ACETAMINOPHEN 500 MG PO TABS: 1000.0000 mg | ORAL_TABLET | Freq: Four times a day (QID) | ORAL | 0 refills | Status: DC | PRN

## 2022-05-27 MED ORDER — IBUPROFEN 600 MG PO TABS: 600.0000 mg | ORAL_TABLET | Freq: Once | ORAL | Status: DC

## 2022-05-27 MED ORDER — IBUPROFEN 100 MG PO CHEW
600.0000 mg | CHEWABLE_TABLET | Freq: Once | ORAL | Status: AC
  Administered 2022-05-27: 600 mg via ORAL
  Filled 2022-05-27: qty 6

## 2022-05-27 MED ORDER — METOCLOPRAMIDE HCL 5 MG/ML IJ SOLN
5.0000 mg | Freq: Once | INTRAMUSCULAR | Status: AC
  Administered 2022-05-27: 5 mg via INTRAVENOUS
  Filled 2022-05-27: qty 2

## 2022-05-27 MED ORDER — ONDANSETRON HCL 4 MG/2ML IJ SOLN
4.0000 mg | Freq: Once | INTRAMUSCULAR | Status: AC
  Administered 2022-05-27: 4 mg via INTRAVENOUS
  Filled 2022-05-27: qty 2

## 2022-05-27 MED ORDER — ACETAMINOPHEN 500 MG PO TABS: 1000.0000 mg | ORAL_TABLET | Freq: Once | ORAL | Status: DC

## 2022-05-27 MED ORDER — SODIUM CHLORIDE 0.9 % IV SOLN
12.5000 mg | Freq: Four times a day (QID) | INTRAVENOUS | Status: DC | PRN
  Administered 2022-05-27: 12.5 mg via INTRAVENOUS
  Filled 2022-05-27: qty 0.5

## 2022-05-27 NOTE — MAU Note (Signed)
.  Victoria Garrett is a 22 y.o. at [redacted]w[redacted]d here in MAU reporting: bilateral mid back pain that radiates to her right shoulder x one week. . Pain is there all the time and feels like a soreness. Denies vaginal bleeding.? Contractions off and on x one week. Also reports she just does not want to eat. Is drinking good. Reports positive fetal movement.  Onset of complaint: one week Pain score: 6/10 Vitals:   05/27/22 1623  BP: 115/62  Pulse: 82  Resp: 15  Temp: 98.3 F (36.8 C)  SpO2: 100%     FHT:142 Lab orders placed from triage:

## 2022-05-27 NOTE — MAU Provider Note (Addendum)
History     CSN: [id number]  Arrival date and time: 05/27/22 1550   Event Date/Time   First Provider Initiated Contact with Patient 05/27/22 1650      Chief Complaint  Patient presents with   Back Pain   Contractions   HPI  Victoria Garrett is a 22 y.o. female G68P2002 @ [redacted]w[redacted]d here in MAU with complaints of bilateral back pain. She reports right and left flank pain. Right is worse than the left. She was treated for a UTI last week and completed her dose of Macrobid. She reports history of kidney stone as a child with stent. No stones reported during the pregnancy. She has no N/V, no fever. Generalized bilateral back pain. She has not tried anything over the counter for the pain.  Reports decreased diet over the last few days.   OB History     Gravida  3   Para  2   Term  2   Preterm      AB      Living  2      SAB      IAB      Ectopic      Multiple  0   Live Births  2           Past Medical History:  Diagnosis Date   Anemia    Anxiety    Benign gestational thrombocytopenia in third trimester 02/28/2020   Plts 135 at new OB, then 127 at 28 weeks Check platelets at 36 week visit [ ]     Depression    Heart murmur    as a child    Preterm uterine contractions in third trimester, antepartum 02/28/2020   Supervision of other normal pregnancy, antepartum 02/13/2020    Nursing Staff Provider Office Location [place] [facility]  Dating   6w 5d [place] Language  ENGLISH Anatomy US   WNL visible in Care everywhere 10/2019 Flu Vaccine  02/14/2020 Genetic Screen  NIPS:   AFP:   First Screen:  Quad:   TDaP Vaccine   02/14/2020 Hgb A1C or  GTT Normal 1 hour 01/04/2020 per [facility] COVID Vaccine NOT VACCINATED   LAB RESULTS  Rhogam   Blood Type    B POS Feeding Plan BREASTFEED A   UTI in pregnancy 03/02/2020   Vs bacteriuria. Treated 1/14. [ ]  TOC   Vaginal Pap smear, abnormal     Past Surgical History:  Procedure Laterality Date   IR NEPHROURETERAL CATH PLACE RIGHT  Right 10/2015   NEPHROSTOMY TUBE PLACEMENT ([facility] HX)      Family History  Problem Relation Age of Onset   Healthy Mother    Healthy Father    Asthma Sister    Diabetes Maternal Grandmother     Social History   Tobacco Use   Smoking status: Never   Smokeless tobacco: Never  Vaping Use   Vaping Use: Former   Substances: Flavoring  Substance Use Topics   Alcohol use: Not Currently   Drug use: Yes    Types: Marijuana    Comment: used x one last week    Allergies: No Known Allergies  Medications Prior to Admission  Medication Sig Dispense Refill Last Dose   mirtazapine (REMERON) 45 MG tablet Take 1 tablet (45 mg total) by mouth at bedtime. 30 tablet 0 05/26/2022   nitrofurantoin, macrocrystal-monohydrate, (MACROBID) 100 MG capsule Take 1 capsule (100 mg total) by mouth 2 (two) times daily. 14 capsule 1 05/27/2022   ondansetron (  ZOFRAN-ODT) 4 MG disintegrating tablet Take 1 tablet (4 mg total) by mouth every 6 (six) hours as needed for nausea. 60 tablet 3 05/27/2022   Prenatal Vit-Fe Fumarate-FA (PRENATAL MULTIVITAMIN) TABS tablet Take 1 tablet by mouth daily. 30 tablet 0 05/26/2022   hydrOXYzine (ATARAX) 50 MG tablet Take 1 tablet (50 mg total) by mouth every 6 (six) hours as needed for anxiety. 60 tablet 0    US RENAL  Result Date: 05/27/2022 CLINICAL DATA:  Flank pain.  Patient currently pregnant. EXAM: RENAL / URINARY TRACT ULTRASOUND COMPLETE COMPARISON:  Renal ultrasound 04/26/2022 FINDINGS: Right Kidney: Renal measurements: 11.3 x 4.4 x 7.3 cm = volume: 191 mL. Echogenicity within normal limits. No mass. Mild/moderate hydronephrosis. Left Kidney: Renal measurements: 11.4 x 5.7 x 5.4 cm = volume: 184 mL. Echogenicity within normal limits. No mass. Mild hydronephrosis. Bladder: Appears normal for degree of bladder distention. Other: None. IMPRESSION: Mild/moderate right and mild left hydronephrosis, which may be secondary to pregnancy. Electronically Signed   By: Emmaline Kluver M.D.    On: 05/27/2022 18:22     Results for orders placed or performed during the hospital encounter of 05/27/22 (from the past 48 hour(s))  Urinalysis, Routine w reflex microscopic -Urine, Clean Catch     Status: Abnormal   Collection Time: 05/27/22  5:17 PM  Result Value Ref Range   Color, Urine AMBER (A) YELLOW    Comment: BIOCHEMICALS MAY BE AFFECTED BY COLOR   APPearance HAZY (A) CLEAR   Specific Gravity, Urine 1.020 1.005 - 1.030   pH 6.0 5.0 - 8.0   Glucose, UA NEGATIVE NEGATIVE mg/dL   Hgb urine dipstick NEGATIVE NEGATIVE   Bilirubin Urine NEGATIVE NEGATIVE   Ketones, ur 80 (A) NEGATIVE mg/dL   Protein, ur 30 (A) NEGATIVE mg/dL   Nitrite NEGATIVE NEGATIVE   Leukocytes,Ua SMALL (A) NEGATIVE   RBC / HPF 0-5 0 - 5 RBC/hpf   WBC, UA 11-20 0 - 5 WBC/hpf   Bacteria, UA RARE (A) NONE SEEN   Squamous Epithelial / HPF 6-10 0 - 5 /HPF   Mucus PRESENT     Comment: Performed at [facility], [address] [place], [place] [address]  CBC with Differential/Platelet     Status: Abnormal   Collection Time: 05/27/22  5:20 PM  Result Value Ref Range   WBC 7.7 4.0 - 10.5 K/uL   RBC 4.11 3.87 - 5.11 MIL/uL   Hemoglobin 11.7 (L) 12.0 - 15.0 g/dL   HCT 60.4 (L) 54.0 - 98.1 %   MCV 81.3 80.0 - 100.0 fL   MCH 28.5 26.0 - 34.0 pg   MCHC 35.0 30.0 - 36.0 g/dL   RDW 19.1 47.8 - 29.5 %   Platelets 105 (L) 150 - 400 K/uL    Comment: REPEATED TO VERIFY   nRBC 0.0 0.0 - 0.2 %   Neutrophils Relative % 67 %   Neutro Abs 5.2 1.7 - 7.7 K/uL   Lymphocytes Relative 26 %   Lymphs Abs 2.0 0.7 - 4.0 K/uL   Monocytes Relative 6 %   Monocytes Absolute 0.4 0.1 - 1.0 K/uL   Eosinophils Relative 0 %   Eosinophils Absolute 0.0 0.0 - 0.5 K/uL   Basophils Relative 0 %   Basophils Absolute 0.0 0.0 - 0.1 K/uL   Immature Granulocytes 1 %   Abs Immature Granulocytes 0.05 0.00 - 0.07 K/uL    Comment: Performed at [facility], [address] [place], [place] [address]  Comprehensive metabolic panel      Status: Abnormal   Collection Time: 05/27/22  5:20 PM  Result Value Ref Range   Sodium 135 135 - 145 mmol/L   Potassium 3.2 (L) 3.5 - 5.1 mmol/L   Chloride 105 98 - 111 mmol/L   CO2 19 (L) 22 - 32 mmol/L   Glucose, Bld 70 70 - 99 mg/dL    Comment: Glucose reference range applies only to samples taken after fasting for at least 8 hours.   BUN <5 (L) 6 - 20 mg/dL   Creatinine, Ser 8.110.39 (L) 0.44 - 1.00 mg/dL   Calcium 8.7 (L) 8.9 - 10.3 mg/dL   Total Protein 6.0 (L) 6.5 - 8.1 g/dL   Albumin 3.2 (L) 3.5 - 5.0 g/dL   AST 13 (L) 15 - 41 U/L   ALT 16 0 - 44 U/L   Alkaline Phosphatase 59 38 - 126 U/L   Total Bilirubin 0.7 0.3 - 1.2 mg/dL   GFR, Estimated >91>60 >47>60 mL/min    Comment: (NOTE) Calculated using the CKD-EPI Creatinine Equation (2021)    Anion gap 11 5 - 15    Comment: Performed at [facility], [address] [place], [place] [address]    Review of Systems  Constitutional:  Negative for chills and fever.  HENT:  Negative for congestion and sore throat.   Eyes:  Negative for pain and visual disturbance.  Respiratory:  Negative for cough, chest tightness and shortness of breath.   Cardiovascular:  Negative for chest pain.  Gastrointestinal:  Negative for abdominal pain, diarrhea, nausea and vomiting.  Endocrine: Negative for cold intolerance and heat intolerance.  Genitourinary:  Positive for flank pain. Negative for dysuria and urgency.  Musculoskeletal:  Positive for back pain.  Skin:  Negative for rash.  Allergic/Immunologic: Negative for food allergies.  Neurological:  Negative for dizziness and light-headedness.  Psychiatric/Behavioral:  Negative for agitation.    Physical Exam   Blood pressure 115/62, pulse 82, temperature 98.3 F (36.8 C), temperature source Oral, resp. rate 15, last menstrual period 11/16/2021, SpO2 100 %.  Physical Exam Constitutional:      General: She is not in acute distress.    Appearance: Normal appearance. She is not ill-appearing,  toxic-appearing or diaphoretic.  HENT:     Head: Normocephalic and atraumatic.     Mouth/Throat:     Mouth: Mucous membranes are moist.  Cardiovascular:     Rate and Rhythm: Normal rate.     Pulses: Normal pulses.  Pulmonary:     Effort: Pulmonary effort is normal.  Abdominal:     Palpations: Abdomen is soft.     Tenderness: There is right CVA tenderness. There is no left CVA tenderness.  Musculoskeletal:        General: Normal range of motion.     Thoracic back: Tenderness present.     Lumbar back: Tenderness present.  Skin:    General: Skin is warm and dry.     Capillary Refill: Capillary refill takes less than 2 seconds.  Neurological:     Mental Status: She is alert and oriented to person, place, and time.  Psychiatric:        Behavior: Behavior normal.    NST: 135 bpm, + accels, no decels. No contractions.   MAU Course  Procedures NST: 130s/Moderate/+accels (10x10) and no decels Toco Quiet Monitored from 16:24 until 20:11   MDM- Moderate  Unlikely pyelonephritis given bilateral back/flank pain, she is afebrile and normal  WBC. Pain is likely 2/2 to hydronephrosis.  CBC is stable.  UA looks reassuring but 80 keytones, urine culture recollected.  LR bolus x 2, ibuprofen 600 mg given Patient vomited in the room; Reviewed patient with Dr. Jolayne Panther and Dr. Alvester Morin. We will continue to hydrate with antiemetics and pain management.  Care resumed by Dr. Alvester Morin.  Rasch, Harolyn Rutherford, NP  10:10 PM Accepted sign out from Venia Carbon NP Federico Flake, MD  10:10 PM Ordered Zofran, Reglan and another IVF bolus. Patient unable to have other medications as she is driving home.   DDX for flank pain includes  - Pyelonephritis in the setting of mild/moderate hydronephrosis and recent UTI tho UA is very reassuring against this as is WBC count and fact that is is afebrile here and by history.  - Nephrolithiasis- though UA is without blood which would be typical for this  complaint - MSK pain  10:10 PM evaluated patient patient show is resting comfortably. Denies any pain. Report nausea is gone and has not vomited for nearly 2 hours.  Reviewed discharge and patient is amenable.   Assessment and Plan   1. Left flank pain   2. Supervision of other normal pregnancy, antepartum   3. Vomiting pregnancy   4. [redacted] weeks gestation of pregnancy   5. NST (non-stress test) reactive    - Recommended straining urine  - Return for worsening vomiting, pain, fever or any other concern - Follow up with outpatient provider  Future Appointments  Date Time Provider Department Center  06/12/2022  9:15 AM [facility] NURSE [facility]  06/12/2022  9:30 AM [facility] US3 [facility]  06/17/2022  9:00 AM [facility] None  06/17/2022 10:35 AM Carlynn Herald, CNM [facility] None     Allergies as of 05/27/2022   No Known Allergies      Medication List     TAKE these medications    acetaminophen 500 MG tablet Commonly known as: TYLENOL Take 2 tablets (1,000 mg total) by mouth every 6 (six) hours as needed.   hydrOXYzine 50 MG tablet Commonly known as: ATARAX Take 1 tablet (50 mg total) by mouth every 6 (six) hours as needed for anxiety.   mirtazapine 45 MG tablet Commonly known as: REMERON Take 1 tablet (45 mg total) by mouth at bedtime.   nitrofurantoin (macrocrystal-monohydrate) 100 MG capsule Commonly known as: MACROBID Take 1 capsule (100 mg total) by mouth 2 (two) times daily.   ondansetron 4 MG disintegrating tablet Commonly known as: ZOFRAN-ODT Take 1 tablet (4 mg total) by mouth every 6 (six) hours as needed for nausea.   Prenatal 27-1 MG Tabs Take 1 tablet by mouth daily.

## 2022-05-27 NOTE — Telephone Encounter (Signed)
TC from pt reporting recent TX for UTI in pregnancy. Macrobid RX sent 05/20/22 for Staphylococcus epidermidis. Pt now reporting onset of right flank pain that is "spreading" and "I just don't feel right." Advised pt to seek care in MAU for evaluation for pyelo.

## 2022-05-29 LAB — CULTURE, OB URINE

## 2022-05-30 ENCOUNTER — Telehealth: Payer: Self-pay | Admitting: *Deleted

## 2022-05-30 NOTE — Telephone Encounter (Signed)
TC from pt reporting right shoulder pain. Pain is constant but does increase with raising arm up over her head. Sts "I can't hold it up there to long or it will feel heavy when I bring it down." Reports good FM and denies VB, LOF, or UC's. No SOB. Advised pt to try ice or heat to the area 20 min on 2 hrs off and tylenol. Reassured pt that pain was not concerning from a pregnancy standpoint. Advised to seek urgent care if ice/ heat/tylenol don't work or her condition becomes worse. Pt verbalized understanding.

## 2022-06-09 DIAGNOSIS — F1290 Cannabis use, unspecified, uncomplicated: Secondary | ICD-10-CM | POA: Insufficient documentation

## 2022-06-09 DIAGNOSIS — O43192 Other malformation of placenta, second trimester: Secondary | ICD-10-CM | POA: Insufficient documentation

## 2022-06-09 DIAGNOSIS — O99210 Obesity complicating pregnancy, unspecified trimester: Secondary | ICD-10-CM | POA: Insufficient documentation

## 2022-06-09 DIAGNOSIS — O0990 Supervision of high risk pregnancy, unspecified, unspecified trimester: Secondary | ICD-10-CM | POA: Insufficient documentation

## 2022-06-12 ENCOUNTER — Ambulatory Visit: Payer: Medicaid Other | Attending: Obstetrics and Gynecology

## 2022-06-12 ENCOUNTER — Other Ambulatory Visit: Payer: Self-pay | Admitting: *Deleted

## 2022-06-12 ENCOUNTER — Ambulatory Visit: Payer: Medicaid Other | Admitting: *Deleted

## 2022-06-12 VITALS — BP 135/62 | HR 89

## 2022-06-12 DIAGNOSIS — O43192 Other malformation of placenta, second trimester: Secondary | ICD-10-CM | POA: Insufficient documentation

## 2022-06-12 DIAGNOSIS — F1290 Cannabis use, unspecified, uncomplicated: Secondary | ICD-10-CM

## 2022-06-12 DIAGNOSIS — O99320 Drug use complicating pregnancy, unspecified trimester: Secondary | ICD-10-CM | POA: Diagnosis present

## 2022-06-12 DIAGNOSIS — Z3A27 27 weeks gestation of pregnancy: Secondary | ICD-10-CM | POA: Diagnosis not present

## 2022-06-12 DIAGNOSIS — O0990 Supervision of high risk pregnancy, unspecified, unspecified trimester: Secondary | ICD-10-CM | POA: Insufficient documentation

## 2022-06-12 DIAGNOSIS — O43199 Other malformation of placenta, unspecified trimester: Secondary | ICD-10-CM | POA: Diagnosis not present

## 2022-06-12 DIAGNOSIS — E669 Obesity, unspecified: Secondary | ICD-10-CM

## 2022-06-12 DIAGNOSIS — O99212 Obesity complicating pregnancy, second trimester: Secondary | ICD-10-CM | POA: Insufficient documentation

## 2022-06-14 ENCOUNTER — Encounter (HOSPITAL_COMMUNITY): Payer: Self-pay | Admitting: Obstetrics & Gynecology

## 2022-06-14 ENCOUNTER — Inpatient Hospital Stay (HOSPITAL_COMMUNITY)
Admission: AD | Admit: 2022-06-14 | Discharge: 2022-06-14 | Disposition: A | Discharge status: Home / Self Care | Payer: Medicaid Other | Attending: Obstetrics & Gynecology | Admitting: Obstetrics & Gynecology

## 2022-06-14 ENCOUNTER — Inpatient Hospital Stay (HOSPITAL_COMMUNITY): Payer: Medicaid Other

## 2022-06-14 ENCOUNTER — Other Ambulatory Visit: Payer: Self-pay

## 2022-06-14 DIAGNOSIS — F32A Depression, unspecified: Secondary | ICD-10-CM | POA: Insufficient documentation

## 2022-06-14 DIAGNOSIS — O212 Late vomiting of pregnancy: Secondary | ICD-10-CM | POA: Insufficient documentation

## 2022-06-14 DIAGNOSIS — R109 Unspecified abdominal pain: Secondary | ICD-10-CM | POA: Diagnosis not present

## 2022-06-14 DIAGNOSIS — O26892 Other specified pregnancy related conditions, second trimester: Secondary | ICD-10-CM | POA: Insufficient documentation

## 2022-06-14 DIAGNOSIS — F419 Anxiety disorder, unspecified: Secondary | ICD-10-CM | POA: Insufficient documentation

## 2022-06-14 DIAGNOSIS — Z3A27 27 weeks gestation of pregnancy: Secondary | ICD-10-CM

## 2022-06-14 DIAGNOSIS — O99342 Other mental disorders complicating pregnancy, second trimester: Secondary | ICD-10-CM | POA: Insufficient documentation

## 2022-06-14 DIAGNOSIS — O219 Vomiting of pregnancy, unspecified: Secondary | ICD-10-CM

## 2022-06-14 LAB — URINALYSIS, ROUTINE W REFLEX MICROSCOPIC
Bilirubin Urine: NEGATIVE
Glucose, UA: NEGATIVE mg/dL
Hgb urine dipstick: NEGATIVE
Ketones, ur: 80 mg/dL — AB
Nitrite: NEGATIVE
Protein, ur: 100 mg/dL — AB
Specific Gravity, Urine: 1.024 (ref 1.005–1.030)
pH: 6 (ref 5.0–8.0)

## 2022-06-14 LAB — CBC WITH DIFFERENTIAL/PLATELET
Abs Immature Granulocytes: 0.06 10*3/uL (ref 0.00–0.07)
Basophils Absolute: 0 10*3/uL (ref 0.0–0.1)
Basophils Relative: 0 %
Eosinophils Absolute: 0.1 10*3/uL (ref 0.0–0.5)
Eosinophils Relative: 1 %
HCT: 35.4 % — ABNORMAL LOW (ref 36.0–46.0)
Hemoglobin: 12.3 g/dL (ref 12.0–15.0)
Immature Granulocytes: 1 %
Lymphocytes Relative: 11 %
Lymphs Abs: 1.1 10*3/uL (ref 0.7–4.0)
MCH: 27.8 pg (ref 26.0–34.0)
MCHC: 34.7 g/dL (ref 30.0–36.0)
MCV: 80.1 fL (ref 80.0–100.0)
Monocytes Absolute: 0.6 10*3/uL (ref 0.1–1.0)
Monocytes Relative: 6 %
Neutro Abs: 8.5 10*3/uL — ABNORMAL HIGH (ref 1.7–7.7)
Neutrophils Relative %: 81 %
Platelets: 122 10*3/uL — ABNORMAL LOW (ref 150–400)
RBC: 4.42 MIL/uL (ref 3.87–5.11)
RDW: 13.2 % (ref 11.5–15.5)
WBC: 10.3 10*3/uL (ref 4.0–10.5)
nRBC: 0 % (ref 0.0–0.2)

## 2022-06-14 LAB — COMPREHENSIVE METABOLIC PANEL
ALT: 19 U/L (ref 0–44)
AST: 15 U/L (ref 15–41)
Albumin: 3.5 g/dL (ref 3.5–5.0)
Alkaline Phosphatase: 64 U/L (ref 38–126)
Anion gap: 12 (ref 5–15)
BUN: <5 mg/dL — ABNORMAL LOW (ref 6–20)
CO2: 19 mmol/L — ABNORMAL LOW (ref 22–32)
Calcium: 9.2 mg/dL (ref 8.9–10.3)
Chloride: 105 mmol/L (ref 98–111)
Creatinine, Ser: 0.56 mg/dL (ref 0.44–1.00)
GFR, Estimated: >60 mL/min (ref >60)
Glucose, Bld: 87 mg/dL (ref 70–99)
Potassium: 3.4 mmol/L — ABNORMAL LOW (ref 3.5–5.1)
Sodium: 136 mmol/L (ref 135–145)
Total Bilirubin: 0.9 mg/dL (ref 0.3–1.2)
Total Protein: 7.4 g/dL (ref 6.5–8.1)

## 2022-06-14 MED ORDER — LACTATED RINGERS IV BOLUS
1000.0000 mL | Freq: Once | INTRAVENOUS | Status: AC
  Administered 2022-06-14: 1000 mL via INTRAVENOUS

## 2022-06-14 MED ORDER — SCOPOLAMINE 1 MG/3DAYS TD PT72
1.0000 | MEDICATED_PATCH | Freq: Once | TRANSDERMAL | Status: DC
  Administered 2022-06-14: 1.5 mg via TRANSDERMAL
  Filled 2022-06-14: qty 1

## 2022-06-14 MED ORDER — SCOPOLAMINE 1 MG/3DAYS TD PT72: 1.0000 | MEDICATED_PATCH | TRANSDERMAL | 1 refills | Status: DC

## 2022-06-14 MED ORDER — FAMOTIDINE IN NACL 20-0.9 MG/50ML-% IV SOLN
20.0000 mg | Freq: Once | INTRAVENOUS | Status: AC
  Administered 2022-06-14: 20 mg via INTRAVENOUS
  Filled 2022-06-14: qty 50

## 2022-06-14 MED ORDER — ACETAMINOPHEN 500 MG PO TABS
1000.0000 mg | ORAL_TABLET | Freq: Once | ORAL | Status: AC
  Administered 2022-06-14: 1000 mg via ORAL
  Filled 2022-06-14: qty 2

## 2022-06-14 MED ORDER — ONDANSETRON 8 MG PO TBDP: 8.0000 mg | ORAL_TABLET | Freq: Four times a day (QID) | ORAL | 1 refills | Status: DC | PRN

## 2022-06-14 MED ORDER — HYDROCODONE-ACETAMINOPHEN 5-325 MG PO TABS: 1.0000 | ORAL_TABLET | Freq: Four times a day (QID) | ORAL | 0 refills | Status: DC | PRN

## 2022-06-14 MED ORDER — SODIUM CHLORIDE 0.9 % IV SOLN
8.0000 mg | Freq: Once | INTRAVENOUS | Status: AC
  Administered 2022-06-14: 8 mg via INTRAVENOUS
  Filled 2022-06-14: qty 4

## 2022-06-14 MED ORDER — CYCLOBENZAPRINE HCL 10 MG PO TABS: 10.0000 mg | ORAL_TABLET | Freq: Three times a day (TID) | ORAL | 0 refills | Status: DC | PRN

## 2022-06-14 MED ORDER — HYDROMORPHONE HCL 1 MG/ML IJ SOLN
1.0000 mg | Freq: Once | INTRAMUSCULAR | Status: AC
  Administered 2022-06-14: 1 mg via INTRAVENOUS
  Filled 2022-06-14: qty 1

## 2022-06-14 MED ORDER — CYCLOBENZAPRINE HCL 5 MG PO TABS
10.0000 mg | ORAL_TABLET | Freq: Once | ORAL | Status: AC
  Administered 2022-06-14: 10 mg via ORAL
  Filled 2022-06-14: qty 2

## 2022-06-14 NOTE — MAU Note (Signed)
Patient returned from radiology

## 2022-06-14 NOTE — MAU Provider Note (Signed)
History     CSN: [id number]  Arrival date and time: 06/14/22 1239   Event Date/Time   First Provider Initiated Contact with Patient 06/14/22 1408      Chief Complaint  Patient presents with   Flank Pain   HPI  Victoria Garrett Is a 22 y.o. female G3P2002 @ [redacted]w[redacted]d here in MAU with complaints of right flank pain. This had been an on going issue. She was seen for this complaint on 4/9 and had a renal US that showed moderate hydronephrosis on the right.. She reports the pain became worse again 3 days ago and she began vomiting, has not been able to keep anything down despite Zofran use. She reports intense, stabbing pain in the Right Flank area.   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   Heart murmur    as a child    History of pyelonephritis during pregnancy 02/14/2020   Hydronephrosis, right 10/03/2015   Poor social situation 02/14/2020   Preterm uterine contractions in third trimester, antepartum 02/28/2020   Supervision of other normal pregnancy, antepartum 02/13/2020    Nursing Staff Provider Office Location [facility] XFER  Dating   6w 5d [place] Language  ENGLISH Anatomy US   WNL visible in Care everywhere 10/2019 Flu Vaccine  02/14/2020 Genetic Screen  NIPS:   AFP:   First Screen:  Quad:   TDaP Vaccine   02/14/2020 Hgb A1C or  GTT Normal 1 hour 01/04/2020 per [facility] COVID Vaccine NOT VACCINATED   LAB RESULTS  Rhogam   Blood Type    B POS Feeding  Plan BREASTFEED A   UTI in pregnancy 03/02/2020   Vs bacteriuria. Treated 1/14. [ ]  TOC   Vaginal Pap smear, abnormal     Past Surgical History:  Procedure Laterality Date   IR NEPHROURETERAL CATH PLACE RIGHT Right 10/2015   NEPHROSTOMY TUBE PLACEMENT ([facility] HX)      Family History  Problem Relation Age of Onset   Healthy Mother    Healthy Father    Asthma Sister    Diabetes Maternal Grandmother     Social History   Tobacco Use   Smoking status: Never   Smokeless tobacco: Never  Vaping Use   Vaping Use: Former   Substances: Flavoring  Substance Use Topics   Alcohol use: Not Currently   Drug use: Yes    Types: Marijuana    Comment: used x one last week    Allergies: No Known Allergies  Medications Prior to Admission  Medication Sig Dispense Refill Last Dose   mirtazapine (REMERON) 45 MG tablet Take 1 tablet (45 mg total) by mouth at bedtime. 30 tablet  0 06/13/2022   ondansetron (ZOFRAN-ODT) 4 MG disintegrating tablet Take 1 tablet (4 mg total) by mouth every 6 (six) hours as needed for nausea. 60 tablet 3 Past Week   Prenatal Vit-Fe Fumarate-FA (PRENATAL MULTIVITAMIN) TABS tablet Take 1 tablet by mouth daily. 30 tablet 0 06/14/2022   acetaminophen (TYLENOL) 500 MG tablet Take 2 tablets (1,000 mg total) by mouth every 6 (six) hours as needed. (Patient not taking: Reported on 06/12/2022) 30 tablet 0    hydrOXYzine (ATARAX) 50 MG tablet Take 1 tablet (50 mg total) by mouth every 6 (six) hours as needed for anxiety. 60 tablet 0 More than a month   Results for orders placed or performed during the hospital encounter of 06/14/22 (from the past 48 hour(s))  Urinalysis, Routine w reflex microscopic -Urine, Clean Catch     Status: Abnormal   Collection Time: 06/14/22  1:14 PM  Result Value Ref Range   Color, Urine YELLOW YELLOW   APPearance HAZY (A) CLEAR   Specific Gravity, Urine 1.024 1.005 - 1.030   pH 6.0 5.0 - 8.0   Glucose, UA NEGATIVE NEGATIVE mg/dL   Hgb urine dipstick  NEGATIVE NEGATIVE   Bilirubin Urine NEGATIVE NEGATIVE   Ketones, ur 80 (A) NEGATIVE mg/dL   Protein, ur 161 (A) NEGATIVE mg/dL   Nitrite NEGATIVE NEGATIVE   Leukocytes,Ua MODERATE (A) NEGATIVE   RBC / HPF 0-5 0 - 5 RBC/hpf   WBC, UA 11-20 0 - 5 WBC/hpf   Bacteria, UA RARE (A) NONE SEEN   Squamous Epithelial / HPF 0-5 0 - 5 /HPF   Mucus PRESENT     Comment: Performed at [facility], [address] [place], [place] [address]  CBC with Differential/Platelet     Status: Abnormal   Collection Time: 06/14/22  1:26 PM  Result Value Ref Range   WBC 10.3 4.0 - 10.5 K/uL   RBC 4.42 3.87 - 5.11 MIL/uL   Hemoglobin 12.3 12.0 - 15.0 g/dL   HCT 54.0 (L) 98.1 - 19.1 %   MCV 80.1 80.0 - 100.0 fL   MCH 27.8 26.0 - 34.0 pg   MCHC 34.7 30.0 - 36.0 g/dL   RDW 47.8 29.5 - 62.1 %   Platelets 122 (L) 150 - 400 K/uL   nRBC 0.0 0.0 - 0.2 %   Neutrophils Relative % 81 %   Neutro Abs 8.5 (H) 1.7 - 7.7 K/uL   Lymphocytes Relative 11 %   Lymphs Abs 1.1 0.7 - 4.0 K/uL   Monocytes Relative 6 %   Monocytes Absolute 0.6 0.1 - 1.0 K/uL   Eosinophils Relative 1 %   Eosinophils Absolute 0.1 0.0 - 0.5 K/uL   Basophils Relative 0 %   Basophils Absolute 0.0 0.0 - 0.1 K/uL   Immature Granulocytes 1 %   Abs Immature Granulocytes 0.06 0.00 - 0.07 K/uL    Comment: Performed at [facility], [address], [place], [place] [address]  Comprehensive metabolic panel     Status: Abnormal   Collection Time: 06/14/22  1:26 PM  Result Value Ref Range   Sodium 136 135 - 145 mmol/L   Potassium 3.4 (L) 3.5 - 5.1 mmol/L   Chloride 105 98 - 111 mmol/L   CO2 19 (L) 22 - 32 mmol/L   Glucose, Bld 87 70 - 99 mg/dL    Comment: Glucose reference range applies only to samples taken after fasting for at least 8 hours.   BUN <5 (L) 6 -  20 mg/dL   Creatinine, Ser 4.54 0.44 - 1.00 mg/dL   Calcium 9.2 8.9 - 09.8 mg/dL   Total Protein 7.4 6.5 - 8.1 g/dL   Albumin 3.5 3.5 - 5.0 g/dL   AST 15 15 - 41 U/L   ALT 19 0 -  44 U/L   Alkaline Phosphatase 64 38 - 126 U/L   Total Bilirubin 0.9 0.3 - 1.2 mg/dL   GFR, Estimated >11 >91 mL/min    Comment: (NOTE) Calculated using the CKD-EPI Creatinine Equation (2021)    Anion gap 12 5 - 15    Comment: Performed at [facility], [address], [place], [place] [address]     CT RENAL STONE STUDY  Result Date: 06/14/2022 CLINICAL DATA:  Abdominal/flank pain, kidney stone suspected. Twenty-seven weeks pregnant. EXAM: CT ABDOMEN AND PELVIS WITHOUT CONTRAST TECHNIQUE: Multidetector CT imaging of the abdomen and pelvis was performed following the standard protocol without IV contrast. RADIATION DOSE REDUCTION: This exam was performed according to the departmental dose-optimization program which includes automated exposure control, adjustment of the mA and/or kV according to patient size and/or use of iterative reconstruction technique. COMPARISON:  Abdominopelvic CT 12/04/2020. Obstetric ultrasound 06/12/2022. Renal ultrasound 05/27/2022. FINDINGS: Lower chest: Clear lung bases. No significant pleural or pericardial effusion. Hepatobiliary: Again demonstrated is diffuse hepatic steatosis. No focal abnormalities are identified on noncontrast imaging. No evidence of gallstones, gallbladder wall thickening or biliary dilatation. Pancreas: Unremarkable. No pancreatic ductal dilatation or surrounding inflammatory changes. Spleen: Normal in size without focal abnormality. Adrenals/Urinary Tract: Both adrenal glands appear normal. Right greater than left hydronephrosis and hydroureter again noted, similar to recent renal ultrasound. There is no evidence of urinary tract calculus, perinephric soft tissue stranding or fluid collection. The bladder appears unremarkable for its degree of distention. Stomach/Bowel: No enteric contrast administered. The stomach appears unremarkable for its degree of distension. No evidence of bowel wall thickening, distention or surrounding inflammatory  change. The appendix appears normal. There is peripheral displacement of bowel from the central abdomen by the gravid uterus. Vascular/Lymphatic: There are no enlarged abdominal or pelvic lymph nodes. No significant vascular findings on noncontrast imaging. Mild distension of the ovarian veins, similar to previous CT. Reproductive: Gravid uterus noted with cephalic presentation. The placenta is located anteriorly on the left. No evidence of adnexal mass. Other: No evidence of abdominal wall mass or hernia. No ascites. Musculoskeletal: No acute or significant osseous findings. IMPRESSION: 1. No acute findings or explanation for the patient's symptoms. No evidence of urinary tract calculus or perinephric soft tissue stranding. 2. Persistent right greater than left hydronephrosis and hydroureter, similar to recent renal ultrasound and likely related to pregnancy. 3. Gravid uterus with cephalic presentation. The placenta is located anteriorly on the left. 4. Hepatic steatosis. 5. Normal appendix. Electronically Signed   By: Carey Bullocks M.D.   On: 06/14/2022 17:22      Review of Systems  Constitutional:  Negative for fever.  Gastrointestinal:  Negative for abdominal pain.  Genitourinary:  Positive for flank pain. Negative for difficulty urinating, dysuria, frequency, hematuria, urgency and vaginal bleeding.   Physical Exam   Blood pressure 99/62, pulse (!) 113, temperature 98.1 F (36.7 C), temperature source Oral, resp. rate 19, height 5\' 2"  (1.575 m), weight 76.2 kg, last menstrual period 11/16/2021, SpO2 98 %.  Physical Exam Constitutional:      General: She is not in acute distress.    Appearance: Normal appearance. She is not ill-appearing, toxic-appearing or diaphoretic.  HENT:  Head: Normocephalic.  Abdominal:     Tenderness: There is no abdominal tenderness. There is right CVA tenderness. There is no left CVA tenderness.  Musculoskeletal:        General: Normal range of motion.   Skin:    General: Skin is warm.  Neurological:     Mental Status: She is alert and oriented to person, place, and time.  Psychiatric:        Behavior: Behavior normal.   Fetal Tracing: Baseline: 150 bpm Variability: Moderate  Accelerations: 15x15 Decelerations: None Toco: UI  MAU Course  Procedures None  MDM  She has had 2 renal US recently; will obtain renal CT for definitive evaluation.  CBC and CMP LR bolus x 2 with Zofran 8 mg, along with dilaudid 1 mg. Confirmed patient has a ride home.  Discussed patient with Dr. Despina Hidden, reviewed CT imaging results, CBC results and UA results. Unlikely pyelonephritis given normal CBC and less concerning urine culture. Urine culture is sent and pending.  Scopolamine patch and flexeril given prior to DC home.   Assessment and Plan   A:   1. Flank pain   2. Nausea and vomiting during pregnancy   3. [redacted] weeks gestation of pregnancy      P;  Dc home Urine culture pending Return to MAU if symptoms worsen Follow up with OB as planned Rx: Zofran 8 mg, Scopolamine patch, Vicodin, Flexeril   Jesslyn Viglione, Harolyn Rutherford, NP 06/14/2022 7:39 PM

## 2022-06-14 NOTE — MAU Note (Signed)
Victoria Garrett is a 22 y.o. at [redacted]w[redacted]d here in MAU reporting: right flank pain that began Thursday and thinks it may be kidney related secondary previous history.  Denies pain with urination, frequency,  and burning, has urgency. States also began having sore throat, fever, congestion, and N/V too, thinks it's just a cold.  States sore throat has resolved but other symptoms remain. Denies VB or LOF.  Endorses +FM, less than usual.   LMP: NA Onset of complaint: Thursday Pain score: 9 Vitals:   06/14/22 1306  BP: 127/66  Pulse: 97  Resp: 19  Temp: 98.1 F (36.7 C)  SpO2: 98%     FHT: 148 bpm Lab orders placed from triage:   UA

## 2022-06-14 NOTE — MAU Note (Signed)
RN called CT to inquire about timeframe. CT tech informed RN that they would be calling for the patient soon.

## 2022-06-16 LAB — CULTURE, OB URINE: Culture: 30000 — AB

## 2022-06-17 ENCOUNTER — Other Ambulatory Visit (HOSPITAL_COMMUNITY)
Admission: RE | Admit: 2022-06-17 | Discharge: 2022-06-17 | Disposition: A | Discharge status: Home / Self Care | Payer: Medicaid Other | Source: Ambulatory Visit | Attending: Certified Nurse Midwife | Admitting: Certified Nurse Midwife

## 2022-06-17 ENCOUNTER — Other Ambulatory Visit: Payer: Self-pay | Admitting: Obstetrics and Gynecology

## 2022-06-17 ENCOUNTER — Encounter: Payer: Self-pay | Admitting: Certified Nurse Midwife

## 2022-06-17 ENCOUNTER — Telehealth: Payer: Self-pay | Admitting: Obstetrics and Gynecology

## 2022-06-17 ENCOUNTER — Ambulatory Visit (INDEPENDENT_AMBULATORY_CARE_PROVIDER_SITE_OTHER): Payer: Medicaid Other | Admitting: Certified Nurse Midwife

## 2022-06-17 ENCOUNTER — Other Ambulatory Visit: Payer: Medicaid Other

## 2022-06-17 VITALS — BP 115/71 | HR 86 | Wt 169.2 lb

## 2022-06-17 DIAGNOSIS — O0992 Supervision of high risk pregnancy, unspecified, second trimester: Secondary | ICD-10-CM | POA: Diagnosis present

## 2022-06-17 DIAGNOSIS — N898 Other specified noninflammatory disorders of vagina: Secondary | ICD-10-CM | POA: Insufficient documentation

## 2022-06-17 DIAGNOSIS — R12 Heartburn: Secondary | ICD-10-CM | POA: Diagnosis present

## 2022-06-17 DIAGNOSIS — Z3A28 28 weeks gestation of pregnancy: Secondary | ICD-10-CM | POA: Insufficient documentation

## 2022-06-17 DIAGNOSIS — Z23 Encounter for immunization: Secondary | ICD-10-CM

## 2022-06-17 DIAGNOSIS — O26892 Other specified pregnancy related conditions, second trimester: Secondary | ICD-10-CM | POA: Diagnosis present

## 2022-06-17 DIAGNOSIS — R87810 Cervical high risk human papillomavirus (HPV) DNA test positive: Secondary | ICD-10-CM | POA: Insufficient documentation

## 2022-06-17 DIAGNOSIS — R87610 Atypical squamous cells of undetermined significance on cytologic smear of cervix (ASC-US): Secondary | ICD-10-CM

## 2022-06-17 DIAGNOSIS — R0981 Nasal congestion: Secondary | ICD-10-CM

## 2022-06-17 MED ORDER — SULFAMETHOXAZOLE-TRIMETHOPRIM 800-160 MG PO TABS: 1.0000 | ORAL_TABLET | Freq: Two times a day (BID) | ORAL | 0 refills | Status: AC

## 2022-06-17 MED ORDER — FAMOTIDINE 20 MG PO TABS: 20.0000 mg | ORAL_TABLET | Freq: Two times a day (BID) | ORAL | 3 refills | Status: DC

## 2022-06-17 NOTE — Progress Notes (Signed)
Pt presents for ROB visit. Pt report yellow discharge for 1-2 weeks, denies itching and odor. Pt also reports heartburn, requesting Rx.

## 2022-06-17 NOTE — Patient Instructions (Signed)

## 2022-06-17 NOTE — Telephone Encounter (Signed)
+   flank pain and was seen in MAU recently.  Urine culture with 30,000 COLONIES/mL STAPHYLOCOCCUS EPIDERMIDIS noted on urine culture.   Rx for bactrim sent to pharmacy and patient was notified.  Duane Lope, NP 06/17/2022 5:14 PM

## 2022-06-18 LAB — GLUCOSE TOLERANCE, 2 HOURS W/ 1HR
Glucose, 1 hour: 118 mg/dL (ref 70–179)
Glucose, 2 hour: 86 mg/dL (ref 70–152)
Glucose, Fasting: 82 mg/dL (ref 70–91)

## 2022-06-18 LAB — CERVICOVAGINAL ANCILLARY ONLY
Bacterial Vaginitis (gardnerella): POSITIVE — AB
Candida Glabrata: NEGATIVE
Candida Vaginitis: POSITIVE — AB
Chlamydia: NEGATIVE
Comment: NEGATIVE
Comment: NEGATIVE
Comment: NEGATIVE
Comment: NEGATIVE
Comment: NEGATIVE
Comment: NORMAL
Neisseria Gonorrhea: NEGATIVE
Trichomonas: NEGATIVE

## 2022-06-18 LAB — CBC/D/PLT+RPR+RH+ABO+RUBIGG...
Antibody Screen: NEGATIVE
Basophils Absolute: 0 10*3/uL (ref 0.0–0.2)
Basos: 0 %
EOS (ABSOLUTE): 0.1 10*3/uL (ref 0.0–0.4)
Eos: 1 %
HCV Ab: NONREACTIVE
HIV Screen 4th Generation wRfx: NONREACTIVE
Hematocrit: 35.1 % (ref 34.0–46.6)
Hemoglobin: 11.5 g/dL (ref 11.1–15.9)
Hepatitis B Surface Ag: NEGATIVE
Immature Grans (Abs): 0.1 10*3/uL (ref 0.0–0.1)
Immature Granulocytes: 1 %
Lymphocytes Absolute: 1.3 10*3/uL (ref 0.7–3.1)
Lymphs: 19 %
MCH: 26.6 pg (ref 26.6–33.0)
MCHC: 32.8 g/dL (ref 31.5–35.7)
MCV: 81 fL (ref 79–97)
Monocytes Absolute: 0.5 10*3/uL (ref 0.1–0.9)
Monocytes: 7 %
Neutrophils Absolute: 5.1 10*3/uL (ref 1.4–7.0)
Neutrophils: 72 %
Platelets: 125 10*3/uL — ABNORMAL LOW (ref 150–450)
RBC: 4.33 x10E6/uL (ref 3.77–5.28)
RDW: 13.1 % (ref 11.7–15.4)
RPR Ser Ql: NONREACTIVE
Rh Factor: POSITIVE
Rubella Antibodies, IGG: 1.78 index (ref >0.99)
WBC: 7.1 10*3/uL (ref 3.4–10.8)

## 2022-06-18 LAB — HCV INTERPRETATION

## 2022-06-20 MED ORDER — TERCONAZOLE 0.4 % VA CREA: 1.0000 | TOPICAL_CREAM | Freq: Every day | VAGINAL | 0 refills | Status: DC

## 2022-06-20 MED ORDER — METRONIDAZOLE 500 MG PO TABS: 500.0000 mg | ORAL_TABLET | Freq: Two times a day (BID) | ORAL | 0 refills | Status: DC

## 2022-06-20 NOTE — Progress Notes (Signed)
   PRENATAL VISIT NOTE  Subjective:  Victoria Garrett is a 22 y.o. G3P2002 at [redacted]w[redacted]d being seen today for ongoing prenatal care.  She is currently monitored for the following issues for this low-risk pregnancy and has Major depressive disorder affecting pregnancy; PTSD (post-traumatic stress disorder); Severe recurrent major depression without psychotic features (HCC); Suicide attempt by drug overdose (HCC); Supervision of high risk pregnancy, antepartum; Obesity affecting pregnancy; Marijuana use during pregnancy; and Marginal insertion of umbilical cord affecting management of mother in second trimester on their problem list.  Patient reports  seasonal allergies, heartburn  and yellowish vaginal discharge. She denies vaginal itching or irritation and burning with urination.  .  Contractions: Irritability. Vag. Bleeding: None.  Movement: Present. Denies leaking of fluid.   The following portions of the patient's history were reviewed and updated as appropriate: allergies, current medications, past family history, past medical history, past social history, past surgical history and problem list.   Objective:   Vitals:   06/17/22 1027  BP: 115/71  Pulse: 86  Weight: 169 lb 3.2 oz (76.7 kg)    Fetal Status: Fetal Heart Rate (bpm): 138 Fundal Height: 31 cm Movement: Present     General:  Alert, oriented and cooperative. Patient is in no acute distress.  Skin: Skin is warm and dry. No rash noted.   Cardiovascular: Normal heart rate noted  Respiratory: Normal respiratory effort, no problems with respiration noted  Abdomen: Soft, gravid, appropriate for gestational age.  Pain/Pressure: Present     Pelvic: Cervical exam deferred        Extremities: Normal range of motion.  Edema: Trace  Mental Status: Normal mood and affect. Normal behavior. Normal judgment and thought content.   Assessment and Plan:  Pregnancy: G3P2002 at [redacted]w[redacted]d 1. Supervision of high risk pregnancy in second  trimester - Patient doing well. She reports frequent and vigorous fetal movement   2. [redacted] weeks gestation of pregnancy - 28 week labs collected today.  - GTT collected  - Cervicovaginal ancillary only( [place]) - HIV antibody (with reflex) - RPR - Glucose Tolerance, 2 Hours w/1 Hour - CBC/D/Plt+RPR+Rh+ABO+RubIgG... - Cervicovaginal ancillary only( [place]) 3. Heartburn during pregnancy in second trimester - Reviewed heartburn as a normal discomfort of pregnancy.  - Rx for Pepcid ordered.   4. Vaginal discharge during pregnancy in second trimester - Self collection vaginal swabs collected.  - Cervicovaginal ancillary only( [place])  5. Nasal congestion - Encouraged the use of Allergy medications such as Claritin and Zyrtec that are safe in pregnancy - Safe medication in pregnancy list provided    Preterm labor symptoms and general obstetric precautions including but not limited to vaginal bleeding, contractions, leaking of fluid and fetal movement were reviewed in detail with the patient. Please refer to After Visit Summary for other counseling recommendations.   Return in about 2 weeks (around 07/01/2022) for LOB.  Future Appointments  Date Time Provider Department Center  07/02/2022  9:55 AM Brock Bad, MD [facility] None  07/17/2022 10:30 AM [facility] NURSE [facility]  07/17/2022 10:45 AM [facility] US6 [facility]    Chisum Habenicht Danella Deis) Suzie Portela, MSN, CNM  Center for [facility]  06/20/22 4:06 PM

## 2022-06-20 NOTE — Addendum Note (Signed)
Addended by: Carlynn Herald on: 06/20/2022 05:52 PM   Modules accepted: Orders

## 2022-06-23 ENCOUNTER — Encounter (HOSPITAL_COMMUNITY): Payer: Self-pay

## 2022-07-02 ENCOUNTER — Ambulatory Visit (INDEPENDENT_AMBULATORY_CARE_PROVIDER_SITE_OTHER): Payer: Medicaid Other | Admitting: Obstetrics

## 2022-07-02 VITALS — BP 121/70 | HR 95 | Wt 169.9 lb

## 2022-07-02 DIAGNOSIS — O219 Vomiting of pregnancy, unspecified: Secondary | ICD-10-CM

## 2022-07-02 DIAGNOSIS — Z3480 Encounter for supervision of other normal pregnancy, unspecified trimester: Secondary | ICD-10-CM

## 2022-07-02 DIAGNOSIS — R55 Syncope and collapse: Secondary | ICD-10-CM

## 2022-07-02 NOTE — Progress Notes (Signed)
ROB 30.[redacted] wks GA Reports cont'd thick white vaginal discharge Reports cont'd nausea, scopolamine  Had a syncopal episode on Mother's Day. Smoked "one hit" of marijuana and later began feeling nauseous and voices around her were muffled. She went down on all fours and then family reports she passed out for several minutes. Pt was able to recover to normal feeling and function. Did not seek care. Denies fall or hitting abdomen or head.

## 2022-07-02 NOTE — Progress Notes (Signed)
Subjective:  Victoria Garrett is a 22 y.o. G3P2002 at [redacted]w[redacted]d being seen today for ongoing prenatal care.  She is currently monitored for the following issues for this high-risk pregnancy and has Major depressive disorder affecting pregnancy; PTSD (post-traumatic stress disorder); Severe recurrent major depression without psychotic features (HCC); Suicide attempt by drug overdose (HCC); Supervision of high risk pregnancy, antepartum; Obesity affecting pregnancy; Marijuana use during pregnancy; and Marginal insertion of umbilical cord affecting management of mother in second trimester on their problem list.  Patient reports nausea.  Contractions: Irregular. Vag. Bleeding: None.  Movement: Present. Denies leaking of fluid.   The following portions of the patient's history were reviewed and updated as appropriate: allergies, current medications, past family history, past medical history, past social history, past surgical history and problem list. Problem list updated.  Objective:   Vitals:   07/02/22 0943  BP: 121/70  Pulse: 95  Weight: 169 lb 14.4 oz (77.1 kg)    Fetal Status: Fetal Heart Rate (bpm): 150   Movement: Present     General:  Alert, oriented and cooperative. Patient is in no acute distress.  Skin: Skin is warm and dry. No rash noted.   Cardiovascular: Normal heart rate noted  Respiratory: Normal respiratory effort, no problems with respiration noted  Abdomen: Soft, gravid, appropriate for gestational age. Pain/Pressure: Present     Pelvic:  Cervical exam deferred        Extremities: Normal range of motion.  Edema: None  Mental Status: Normal mood and affect. Normal behavior. Normal judgment and thought content.   Urinalysis:      Assessment and Plan:  Pregnancy: G3P2002 at [redacted]w[redacted]d  1. Supervision of other normal pregnancy, antepartum  2. Nausea and vomiting during pregnancy  3. Syncope, unspecified syncope type Rx: - AMB Referral to Cardio Obstetrics  Preterm labor  symptoms and general obstetric precautions including but not limited to vaginal bleeding, contractions, leaking of fluid and fetal movement were reviewed in detail with the patient. Please refer to After Visit Summary for other counseling recommendations.   Return in about 2 weeks (around 07/16/2022) for [facility].   Brock Bad, MD 07/02/22

## 2022-07-17 ENCOUNTER — Encounter (HOSPITAL_COMMUNITY): Payer: Self-pay | Admitting: Obstetrics and Gynecology

## 2022-07-17 ENCOUNTER — Ambulatory Visit: Payer: Medicaid Other | Admitting: *Deleted

## 2022-07-17 ENCOUNTER — Encounter: Payer: Medicaid Other | Admitting: Obstetrics and Gynecology

## 2022-07-17 ENCOUNTER — Ambulatory Visit: Payer: Medicaid Other | Attending: Obstetrics

## 2022-07-17 ENCOUNTER — Inpatient Hospital Stay (HOSPITAL_COMMUNITY): Payer: Medicaid Other

## 2022-07-17 ENCOUNTER — Inpatient Hospital Stay (HOSPITAL_COMMUNITY)
Admission: AD | Admit: 2022-07-17 | Discharge: 2022-07-18 | Disposition: A | Discharge status: Home / Self Care | Payer: Medicaid Other | Attending: Obstetrics & Gynecology | Admitting: Obstetrics & Gynecology

## 2022-07-17 VITALS — BP 126/74 | HR 95

## 2022-07-17 DIAGNOSIS — R079 Chest pain, unspecified: Secondary | ICD-10-CM | POA: Insufficient documentation

## 2022-07-17 DIAGNOSIS — K219 Gastro-esophageal reflux disease without esophagitis: Secondary | ICD-10-CM | POA: Diagnosis not present

## 2022-07-17 DIAGNOSIS — O43199 Other malformation of placenta, unspecified trimester: Secondary | ICD-10-CM | POA: Insufficient documentation

## 2022-07-17 DIAGNOSIS — O4703 False labor before 37 completed weeks of gestation, third trimester: Secondary | ICD-10-CM

## 2022-07-17 DIAGNOSIS — O26893 Other specified pregnancy related conditions, third trimester: Secondary | ICD-10-CM | POA: Diagnosis present

## 2022-07-17 DIAGNOSIS — Z3A32 32 weeks gestation of pregnancy: Secondary | ICD-10-CM | POA: Diagnosis not present

## 2022-07-17 DIAGNOSIS — O99113 Other diseases of the blood and blood-forming organs and certain disorders involving the immune mechanism complicating pregnancy, third trimester: Secondary | ICD-10-CM | POA: Diagnosis not present

## 2022-07-17 DIAGNOSIS — R12 Heartburn: Secondary | ICD-10-CM | POA: Diagnosis present

## 2022-07-17 DIAGNOSIS — K2100 Gastro-esophageal reflux disease with esophagitis, without bleeding: Secondary | ICD-10-CM | POA: Diagnosis not present

## 2022-07-17 DIAGNOSIS — O99213 Obesity complicating pregnancy, third trimester: Secondary | ICD-10-CM | POA: Diagnosis not present

## 2022-07-17 DIAGNOSIS — E669 Obesity, unspecified: Secondary | ICD-10-CM

## 2022-07-17 DIAGNOSIS — O99613 Diseases of the digestive system complicating pregnancy, third trimester: Secondary | ICD-10-CM | POA: Insufficient documentation

## 2022-07-17 DIAGNOSIS — R0789 Other chest pain: Secondary | ICD-10-CM

## 2022-07-17 DIAGNOSIS — D696 Thrombocytopenia, unspecified: Secondary | ICD-10-CM | POA: Insufficient documentation

## 2022-07-17 DIAGNOSIS — R112 Nausea with vomiting, unspecified: Secondary | ICD-10-CM

## 2022-07-17 DIAGNOSIS — O2313 Infections of bladder in pregnancy, third trimester: Secondary | ICD-10-CM | POA: Insufficient documentation

## 2022-07-17 DIAGNOSIS — O212 Late vomiting of pregnancy: Secondary | ICD-10-CM | POA: Diagnosis not present

## 2022-07-17 DIAGNOSIS — O43193 Other malformation of placenta, third trimester: Secondary | ICD-10-CM

## 2022-07-17 DIAGNOSIS — N3000 Acute cystitis without hematuria: Secondary | ICD-10-CM | POA: Insufficient documentation

## 2022-07-17 DIAGNOSIS — Z79899 Other long term (current) drug therapy: Secondary | ICD-10-CM | POA: Diagnosis not present

## 2022-07-17 DIAGNOSIS — M549 Dorsalgia, unspecified: Secondary | ICD-10-CM | POA: Diagnosis not present

## 2022-07-17 DIAGNOSIS — O99119 Other diseases of the blood and blood-forming organs and certain disorders involving the immune mechanism complicating pregnancy, unspecified trimester: Secondary | ICD-10-CM

## 2022-07-17 LAB — URINALYSIS, ROUTINE W REFLEX MICROSCOPIC
Bilirubin Urine: NEGATIVE
Glucose, UA: NEGATIVE mg/dL
Hgb urine dipstick: NEGATIVE
Ketones, ur: NEGATIVE mg/dL
Nitrite: POSITIVE — AB
Protein, ur: 30 mg/dL — AB
Specific Gravity, Urine: 1.016 (ref 1.005–1.030)
WBC, UA: >50 WBC/hpf (ref 0–5)
pH: 6 (ref 5.0–8.0)

## 2022-07-17 LAB — COMPREHENSIVE METABOLIC PANEL
ALT: 24 U/L (ref 0–44)
AST: 17 U/L (ref 15–41)
Albumin: 3.2 g/dL — ABNORMAL LOW (ref 3.5–5.0)
Alkaline Phosphatase: 83 U/L (ref 38–126)
Anion gap: 12 (ref 5–15)
BUN: <5 mg/dL — ABNORMAL LOW (ref 6–20)
CO2: 20 mmol/L — ABNORMAL LOW (ref 22–32)
Calcium: 9.3 mg/dL (ref 8.9–10.3)
Chloride: 105 mmol/L (ref 98–111)
Creatinine, Ser: 0.36 mg/dL — ABNORMAL LOW (ref 0.44–1.00)
GFR, Estimated: >60 mL/min (ref >60)
Glucose, Bld: 86 mg/dL (ref 70–99)
Potassium: 3.2 mmol/L — ABNORMAL LOW (ref 3.5–5.1)
Sodium: 137 mmol/L (ref 135–145)
Total Bilirubin: 0.6 mg/dL (ref 0.3–1.2)
Total Protein: 6.5 g/dL (ref 6.5–8.1)

## 2022-07-17 LAB — CBC
HCT: 35.1 % — ABNORMAL LOW (ref 36.0–46.0)
Hemoglobin: 11.6 g/dL — ABNORMAL LOW (ref 12.0–15.0)
MCH: 26.2 pg (ref 26.0–34.0)
MCHC: 33 g/dL (ref 30.0–36.0)
MCV: 79.4 fL — ABNORMAL LOW (ref 80.0–100.0)
Platelets: 115 10*3/uL — ABNORMAL LOW (ref 150–400)
RBC: 4.42 MIL/uL (ref 3.87–5.11)
RDW: 13.8 % (ref 11.5–15.5)
WBC: 8 10*3/uL (ref 4.0–10.5)
nRBC: 0 % (ref 0.0–0.2)

## 2022-07-17 LAB — TROPONIN I (HIGH SENSITIVITY): Troponin I (High Sensitivity): 3 ng/L (ref <18)

## 2022-07-17 LAB — AMYLASE: Amylase: 99 U/L (ref 28–100)

## 2022-07-17 LAB — FETAL FIBRONECTIN: Fetal Fibronectin: NEGATIVE

## 2022-07-17 LAB — LIPASE, BLOOD: Lipase: 30 U/L (ref 11–51)

## 2022-07-17 MED ORDER — LIDOCAINE VISCOUS HCL 2 % MT SOLN: 15.0000 mL | Freq: Once | OROMUCOSAL | Status: DC

## 2022-07-17 MED ORDER — NIFEDIPINE 10 MG PO CAPS
10.0000 mg | ORAL_CAPSULE | ORAL | Status: DC | PRN
  Administered 2022-07-17 (×3): 10 mg via ORAL
  Filled 2022-07-17 (×3): qty 1

## 2022-07-17 MED ORDER — ONDANSETRON HCL 4 MG/2ML IJ SOLN
4.0000 mg | Freq: Once | INTRAMUSCULAR | Status: AC
  Administered 2022-07-17: 4 mg via INTRAVENOUS
  Filled 2022-07-17: qty 2

## 2022-07-17 MED ORDER — CYCLOBENZAPRINE HCL 5 MG PO TABS
5.0000 mg | ORAL_TABLET | Freq: Once | ORAL | Status: AC
  Administered 2022-07-17: 5 mg via ORAL
  Filled 2022-07-17: qty 1

## 2022-07-17 MED ORDER — ALUM & MAG HYDROXIDE-SIMETH 200-200-20 MG/5ML PO SUSP
30.0000 mL | Freq: Once | ORAL | Status: AC
  Administered 2022-07-17: 30 mL via ORAL
  Filled 2022-07-17: qty 30

## 2022-07-17 MED ORDER — METOCLOPRAMIDE HCL 5 MG/ML IJ SOLN
5.0000 mg | Freq: Once | INTRAMUSCULAR | Status: AC
  Administered 2022-07-17: 5 mg via INTRAVENOUS
  Filled 2022-07-17: qty 2

## 2022-07-17 MED ORDER — LACTATED RINGERS IV SOLN: Freq: Once | INTRAVENOUS | Status: AC

## 2022-07-17 MED ORDER — SODIUM CHLORIDE 0.9 % IV SOLN
2.0000 g | Freq: Once | INTRAVENOUS | Status: AC
  Administered 2022-07-17: 2 g via INTRAVENOUS
  Filled 2022-07-17: qty 20

## 2022-07-17 MED ORDER — PANTOPRAZOLE SODIUM 20 MG PO TBEC
20.0000 mg | DELAYED_RELEASE_TABLET | Freq: Once | ORAL | Status: AC
  Administered 2022-07-17: 20 mg via ORAL
  Filled 2022-07-17: qty 1

## 2022-07-17 MED ORDER — LIDOCAINE VISCOUS HCL 2 % MT SOLN
15.0000 mL | Freq: Once | OROMUCOSAL | Status: AC
  Administered 2022-07-17: 15 mL via OROMUCOSAL
  Filled 2022-07-17: qty 15

## 2022-07-17 MED ORDER — SODIUM CHLORIDE 0.9 % IV SOLN: Freq: Once | INTRAVENOUS | Status: AC

## 2022-07-17 NOTE — MAU Provider Note (Signed)
Chief Complaint:  Chest Pain   Event Date/Time   First Provider Initiated Contact with Patient 07/17/22 2011     HPI: Victoria Garrett is a 22 y.o. G3P2002 at 57w4dwho presents to maternity admissions reporting heartburn, sharp pains in middle of chest with some radiation to right shoulder.  Has had this before   Has tried Zofran (has hyperemesis) and vomited anyway.  Is taking Pepcid without relief.   Denies SOB but states occasionally "can't catch my breath".  Appears comfortable now, not dyspneic. She reports good fetal movement, denies LOF, vaginal bleeding,  urinary symptoms, diarrhea, constipation or fever/chills.   States had a heart murmur as a child.  States her mother told her that her heart stopped during nephrostomy surgery one time.  Has never followed up with cardiology.  States has an appointment coming up for an episode of syncope.  Chest Pain  This is a recurrent problem. The current episode started today. The pain is present in the substernal region and epigastric region. The quality of the pain is described as burning and sharp. The pain radiates to the right shoulder. Associated symptoms include nausea and vomiting. Pertinent negatives include no back pain, cough, diaphoresis, dizziness, fever, lower extremity edema, palpitations or shortness of breath (occasional "can't catch my breath"). Associated symptoms comments: Acid Reflux. The pain is aggravated by nothing. Treatments tried: Pepcid. The treatment provided no relief. Risk factors: hyperemesis.    RN Note [name] is a 22 y.o. at [redacted]w[redacted]d here in MAU reporting bad heartburn since Sunday. Taking Pepcid since Sunday and not helping. Cannot keep down anything. Zofran not staying down. When the heartburn comes comes I feel pain in my R shoulder and it then radiates down to my chest. Reports good FM and denies LOF or VB. Missed appt at [facility] this afternoon but called and was told to come in. Was seen at [facility] today  and everything good with baby  Onset of complaint: Sunday                                    Pain score: 10  Past Medical History: Past Medical History:  Diagnosis Date   Anemia    Anxiety    ASCUS with positive high risk HPV cervical 04/11/2021   Benign gestational thrombocytopenia in third trimester (HCC) 02/28/2020   Plts 135 at new OB, then 127 at 28 weeks Check platelets at 36 week visit [ ]     Deliberate self-cutting 03/20/2020   Depression    Generalized anxiety disorder with panic attacks 02/14/2020   Heart murmur    as a child    History of pyelonephritis during pregnancy 02/14/2020   Hydronephrosis, right 10/03/2015   Poor social situation 02/14/2020   Preterm uterine contractions in third trimester, antepartum 02/28/2020   Supervision of other normal pregnancy, antepartum 02/13/2020    Nursing Staff Provider Office Location [facility]  Dating   6w 5d [place] Language  ENGLISH Anatomy US   WNL visible in Care everywhere 10/2019 Flu Vaccine  02/14/2020 Genetic Screen  NIPS:   AFP:   First Screen:  Quad:   TDaP Vaccine   02/14/2020 Hgb A1C or  GTT Normal 1 hour 01/04/2020 per [facility] COVID Vaccine NOT VACCINATED   LAB RESULTS  Rhogam   Blood Type    B POS Feeding Plan BREASTFEED A   UTI in pregnancy 03/02/2020   Vs bacteriuria.  Treated 1/14. [ ]  TOC   Vaginal Pap smear, abnormal     Past obstetric history: OB History  Gravida Para Term Preterm AB Living  3 2 2     2   SAB IAB Ectopic Multiple Live Births        0 2    # Outcome Date GA Lbr Len/2nd Weight Sex Delivery Anes PTL Lv  3 Current           2 Term 03/27/20 [redacted]w[redacted]d 01:22 / 00:20 3311 g F Vag-Spont EPI  LIV  1 Term 02/05/16    M Vag-Spont   LIV    Past Surgical History: Past Surgical History:  Procedure Laterality Date   IR NEPHROURETERAL CATH PLACE RIGHT Right 10/2015   NEPHROSTOMY TUBE PLACEMENT ([facility] HX)      Family History: Family History  Problem Relation Age of Onset   Healthy Mother    Healthy  Father    Asthma Sister    Diabetes Maternal Grandmother     Social History: Social History   Tobacco Use   Smoking status: Never   Smokeless tobacco: Never  Vaping Use   Vaping Use: Former   Substances: Flavoring  Substance Use Topics   Alcohol use: Not Currently   Drug use: Yes    Types: Marijuana    Comment: used x one last week    Allergies: No Known Allergies  Meds:  Medications Prior to Admission  Medication Sig Dispense Refill Last Dose   acetaminophen (TYLENOL) 500 MG tablet Take 2 tablets (1,000 mg total) by mouth every 6 (six) hours as needed. (Patient not taking: Reported on 06/12/2022) 30 tablet 0    cyclobenzaprine (FLEXERIL) 10 MG tablet Take 1 tablet (10 mg total) by mouth 3 (three) times daily as needed for muscle spasms. 30 tablet 0    famotidine (PEPCID) 20 MG tablet Take 1 tablet (20 mg total) by mouth 2 (two) times daily. 60 tablet 3    HYDROcodone-acetaminophen (NORCO/VICODIN) 5-325 MG tablet Take 1-2 tablets by mouth every 6 (six) hours as needed for severe pain. (Patient not taking: Reported on 07/02/2022) 10 tablet 0    hydrOXYzine (ATARAX) 50 MG tablet Take 1 tablet (50 mg total) by mouth every 6 (six) hours as needed for anxiety. (Patient not taking: Reported on 07/02/2022) 60 tablet 0    mirtazapine (REMERON) 45 MG tablet Take 1 tablet (45 mg total) by mouth at bedtime. (Patient not taking: Reported on 07/02/2022) 30 tablet 0    ondansetron (ZOFRAN-ODT) 8 MG disintegrating tablet Take 1 tablet (8 mg total) by mouth every 6 (six) hours as needed for nausea. 20 tablet 1    Prenatal Vit-Fe Fumarate-FA (PRENATAL MULTIVITAMIN) TABS tablet Take 1 tablet by mouth daily. 30 tablet 0    scopolamine (TRANSDERM-SCOP) 1 MG/3DAYS Place 1 patch (1.5 mg total) onto the skin every 3 (three) days. (Patient not taking: Reported on 06/17/2022) 3 patch 1     I have reviewed patient's Past Medical Hx, Surgical Hx, Family Hx, Social Hx, medications and allergies.   ROS:   Review of Systems  Constitutional:  Negative for diaphoresis and fever.  Respiratory:  Negative for cough and shortness of breath (occasional "can't catch my breath").   Cardiovascular:  Positive for chest pain. Negative for palpitations.  Gastrointestinal:  Positive for nausea and vomiting.  Musculoskeletal:  Negative for back pain.  Neurological:  Negative for dizziness.   Other systems negative  Physical Exam  Patient Vitals for the past  24 hrs:  BP Temp Pulse Resp SpO2 Height Weight  07/17/22 1959 120/70 -- -- -- -- -- --  07/17/22 1958 -- 98 F (36.7 C) 92 17 100 % 5\' 2"  (1.575 m) 75.8 kg   Constitutional: Well-developed, well-nourished female in no acute distress. Does not appear dyspneic Cardiovascular: normal rate and rhythm Respiratory: normal effort, clear to auscultation bilaterally GI: Abd soft, non-tender, gravid appropriate for gestational age.   No rebound or guarding. MS: Extremities nontender, no edema, normal ROM Neurologic: Alert and oriented x 4.  GU: Neg CVAT.  PELVIC EXAM: Dilation: Closed Effacement (%): 60 Station: -1 Exam by:: Wynelle Bourgeois, CNM.    FHT:  Baseline 130 , moderate variability, accelerations present, no decelerations Contractions: q 2 mins    Labs: Results for orders placed or performed during the hospital encounter of 07/17/22 (from the past 24 hour(s))  Urinalysis, Routine w reflex microscopic -Urine, Clean Catch     Status: Abnormal   Collection Time: 07/17/22  8:19 PM  Result Value Ref Range   Color, Urine AMBER (A) YELLOW   APPearance CLOUDY (A) CLEAR   Specific Gravity, Urine 1.016 1.005 - 1.030   pH 6.0 5.0 - 8.0   Glucose, UA NEGATIVE NEGATIVE mg/dL   Hgb urine dipstick NEGATIVE NEGATIVE   Bilirubin Urine NEGATIVE NEGATIVE   Ketones, ur NEGATIVE NEGATIVE mg/dL   Protein, ur 30 (A) NEGATIVE mg/dL   Nitrite POSITIVE (A) NEGATIVE   Leukocytes,Ua LARGE (A) NEGATIVE   RBC / HPF 0-5 0 - 5 RBC/hpf   WBC, UA >50 0 - 5  WBC/hpf   Bacteria, UA RARE (A) NONE SEEN   Squamous Epithelial / HPF 0-5 0 - 5 /HPF   WBC Clumps PRESENT    Mucus PRESENT    Amorphous Crystal PRESENT   CBC     Status: Abnormal   Collection Time: 07/17/22  8:39 PM  Result Value Ref Range   WBC 8.0 4.0 - 10.5 K/uL   RBC 4.42 3.87 - 5.11 MIL/uL   Hemoglobin 11.6 (L) 12.0 - 15.0 g/dL   HCT 16.1 (L) 09.6 - 04.5 %   MCV 79.4 (L) 80.0 - 100.0 fL   MCH 26.2 26.0 - 34.0 pg   MCHC 33.0 30.0 - 36.0 g/dL   RDW 40.9 81.1 - 91.4 %   Platelets 115 (L) 150 - 400 K/uL   nRBC 0.0 0.0 - 0.2 %  Comprehensive metabolic panel     Status: Abnormal   Collection Time: 07/17/22  8:39 PM  Result Value Ref Range   Sodium 137 135 - 145 mmol/L   Potassium 3.2 (L) 3.5 - 5.1 mmol/L   Chloride 105 98 - 111 mmol/L   CO2 20 (L) 22 - 32 mmol/L   Glucose, Bld 86 70 - 99 mg/dL   BUN <5 (L) 6 - 20 mg/dL   Creatinine, Ser 7.82 (L) 0.44 - 1.00 mg/dL   Calcium 9.3 8.9 - 95.6 mg/dL   Total Protein 6.5 6.5 - 8.1 g/dL   Albumin 3.2 (L) 3.5 - 5.0 g/dL   AST 17 15 - 41 U/L   ALT 24 0 - 44 U/L   Alkaline Phosphatase 83 38 - 126 U/L   Total Bilirubin 0.6 0.3 - 1.2 mg/dL   GFR, Estimated >21 >30 mL/min   Anion gap 12 5 - 15  Lipase, blood     Status: None   Collection Time: 07/17/22  8:39 PM  Result Value Ref Range   Lipase  30 11 - 51 U/L  Amylase     Status: None   Collection Time: 07/17/22  8:39 PM  Result Value Ref Range   Amylase 99 28 - 100 U/L  Troponin I (High Sensitivity)     Status: None   Collection Time: 07/17/22  8:39 PM  Result Value Ref Range   Troponin I (High Sensitivity) 3 <18 ng/L  Fetal fibronectin     Status: None   Collection Time: 07/17/22  9:14 PM  Result Value Ref Range   Fetal Fibronectin NEGATIVE NEGATIVE    B/Positive/-- (04/30 0844)  Imaging:  EKG sinus rhythm with sinus arrhythmia  MAU Course/MDM: I have reviewed the triage vital signs and the nursing notes.   Pertinent labs & imaging results that were available during  my care of the patient were reviewed by me and considered in my medical decision making (see chart for details).      I have reviewed her medical records including past results, notes and treatments.   I have ordered labs and reviewed results.  NST reviewed   Treatments in MAU included as follows:  PTL: given IV fluids and Procardia which diminished contractions Chest pain/Reflux:  Given Maalox and lidocaine which relieved chest pain.  Protonix given (will change from Pepcid to Protonix)   CXR negative. EKG normal    Nausea:   Reglan and zofran relieved nausea.   Probable UTI:  Given Rocephin 2gm  Nitrites and leukocytes  Sent to culture Back Pain:  Flexeril relieved pain  Platelet count noted to be low.  Upon record review, this is present on previous visits  I cannot find any evidence that it has ever been worked up  Added to problem list  Assessment: Single IUP at [redacted]w[redacted]d Threatened preterm labor Chest pain related to Acid Reflux Back pain in pregnancy Acute cystitis Thromobocytopenia  Plan: Discharge home Rx Protonix for reflux Rx Reglan for nausea Preterm Labor precautions and fetal kick counts Follow up in Office for prenatal visits and recheck Encouraged to return if she develops worsening of symptoms, increase in pain, fever, or other concerning symptoms.   Pt stable at time of discharge.  Wynelle Bourgeois CNM, MSN Certified Nurse-Midwife 07/17/2022 8:11 PM

## 2022-07-17 NOTE — MAU Note (Signed)
.  Victoria Garrett is a 22 y.o. at [redacted]w[redacted]d here in MAU reporting bad heartburn since Sunday. Taking Pepcid since Sunday and not helping. Cannot keep down anything. Zofran not staying down. When the heartburn comes comes I feel pain in my R shoulder and it then radiates down to my chest. Reports good FM and denies LOF or VB. Missed appt at [facility] this afternoon but called and was told to come in. Was seen at [facility] today and everything good with baby  Onset of complaint: Sunday Pain score: 10 Vitals:   07/17/22 1958 07/17/22 1959  BP:  120/70  Pulse: 92   Resp: 17   Temp: 98 F (36.7 C)   SpO2: 100%      FHT:140 Lab orders placed from triage:  u/a

## 2022-07-18 DIAGNOSIS — R0789 Other chest pain: Secondary | ICD-10-CM

## 2022-07-18 DIAGNOSIS — N3000 Acute cystitis without hematuria: Secondary | ICD-10-CM | POA: Diagnosis not present

## 2022-07-18 DIAGNOSIS — O99113 Other diseases of the blood and blood-forming organs and certain disorders involving the immune mechanism complicating pregnancy, third trimester: Secondary | ICD-10-CM

## 2022-07-18 DIAGNOSIS — K2100 Gastro-esophageal reflux disease with esophagitis, without bleeding: Secondary | ICD-10-CM

## 2022-07-18 DIAGNOSIS — M549 Dorsalgia, unspecified: Secondary | ICD-10-CM

## 2022-07-18 DIAGNOSIS — R112 Nausea with vomiting, unspecified: Secondary | ICD-10-CM

## 2022-07-18 DIAGNOSIS — Z3A32 32 weeks gestation of pregnancy: Secondary | ICD-10-CM | POA: Diagnosis not present

## 2022-07-18 DIAGNOSIS — D696 Thrombocytopenia, unspecified: Secondary | ICD-10-CM

## 2022-07-18 DIAGNOSIS — O4703 False labor before 37 completed weeks of gestation, third trimester: Secondary | ICD-10-CM

## 2022-07-18 DIAGNOSIS — O99891 Other specified diseases and conditions complicating pregnancy: Secondary | ICD-10-CM

## 2022-07-18 LAB — CULTURE, OB URINE

## 2022-07-18 MED ORDER — METOCLOPRAMIDE HCL 10 MG PO TABS: 10.0000 mg | ORAL_TABLET | Freq: Four times a day (QID) | ORAL | 0 refills | Status: DC | PRN

## 2022-07-18 MED ORDER — PANTOPRAZOLE SODIUM 20 MG PO TBEC: 20.0000 mg | DELAYED_RELEASE_TABLET | Freq: Every day | ORAL | 0 refills | Status: DC

## 2022-07-19 LAB — CULTURE, OB URINE: Culture: >=100000 — AB

## 2022-07-20 ENCOUNTER — Other Ambulatory Visit: Payer: Self-pay

## 2022-07-20 MED ORDER — CEFADROXIL 500 MG PO CAPS: 500.0000 mg | ORAL_CAPSULE | Freq: Two times a day (BID) | ORAL | 0 refills | Status: AC

## 2022-07-25 ENCOUNTER — Encounter: Payer: Self-pay | Admitting: Obstetrics and Gynecology

## 2022-07-25 ENCOUNTER — Ambulatory Visit (INDEPENDENT_AMBULATORY_CARE_PROVIDER_SITE_OTHER): Payer: Medicaid Other | Admitting: Obstetrics and Gynecology

## 2022-07-25 VITALS — BP 120/77 | HR 85 | Wt 170.0 lb

## 2022-07-25 DIAGNOSIS — O99213 Obesity complicating pregnancy, third trimester: Secondary | ICD-10-CM

## 2022-07-25 DIAGNOSIS — O99340 Other mental disorders complicating pregnancy, unspecified trimester: Secondary | ICD-10-CM

## 2022-07-25 DIAGNOSIS — O0990 Supervision of high risk pregnancy, unspecified, unspecified trimester: Secondary | ICD-10-CM

## 2022-07-25 DIAGNOSIS — O43192 Other malformation of placenta, second trimester: Secondary | ICD-10-CM

## 2022-07-25 DIAGNOSIS — F329 Major depressive disorder, single episode, unspecified: Secondary | ICD-10-CM

## 2022-07-25 NOTE — Progress Notes (Signed)
ROB c/o contractions every 2-3 minutes and back pain.  Denies LOF or bleeding.

## 2022-07-25 NOTE — Progress Notes (Signed)
   PRENATAL VISIT NOTE  Subjective:  Victoria Garrett is a 22 y.o. G3P2002 at [redacted]w[redacted]d being seen today for ongoing prenatal care.  She is currently monitored for the following issues for this high-risk pregnancy and has Major depressive disorder affecting pregnancy; PTSD (post-traumatic stress disorder); Severe recurrent major depression without psychotic features (HCC); Suicide attempt by drug overdose (HCC); Supervision of high risk pregnancy, antepartum; Obesity affecting pregnancy; Marijuana use during pregnancy; Marginal insertion of umbilical cord affecting management of mother in second trimester; Moderate cannabis use disorder (HCC); Nausea/vomiting in pregnancy; and Thrombocytopenia (HCC) on their problem list.  Patient reports no complaints.  Contractions: Regular. Vag. Bleeding: None.  Movement: Present. Denies leaking of fluid.   The following portions of the patient's history were reviewed and updated as appropriate: allergies, current medications, past family history, past medical history, past social history, past surgical history and problem list.   Objective:   Vitals:   07/25/22 1017  BP: 120/77  Pulse: 85  Weight: 170 lb (77.1 kg)    Fetal Status: Fetal Heart Rate (bpm): 137 Fundal Height: 34 cm Movement: Present     General:  Alert, oriented and cooperative. Patient is in no acute distress.  Skin: Skin is warm and dry. No rash noted.   Cardiovascular: Normal heart rate noted  Respiratory: Normal respiratory effort, no problems with respiration noted  Abdomen: Soft, gravid, appropriate for gestational age.  Pain/Pressure: Present     Pelvic: Cervical exam deferred        Extremities: Normal range of motion.  Edema: None  Mental Status: Normal mood and affect. Normal behavior. Normal judgment and thought content.   Assessment and Plan:  Pregnancy: G3P2002 at [redacted]w[redacted]d 1. Supervision of high risk pregnancy, antepartum Patient is doing well without complaints Follow  up with ob Cards for syncopal episode x 2 now Patient plans depo-provera for contraception  2. Marginal insertion of umbilical cord affecting management of mother in second trimester No further growth ultrasound needed  3. Obesity affecting pregnancy in third trimester, unspecified obesity type   4. Major depressive disorder affecting pregnancy Stable without medications Plans to restart postpartum  Preterm labor symptoms and general obstetric precautions including but not limited to vaginal bleeding, contractions, leaking of fluid and fetal movement were reviewed in detail with the patient. Please refer to After Visit Summary for other counseling recommendations.   Return in about 2 weeks (around 08/08/2022) for in person, ROB, High risk.  Future Appointments  Date Time Provider Department Center  09/02/2022 10:00 AM Tobb, Lavona [place], DO [facility] None    Catalina Antigua, MD

## 2022-07-29 ENCOUNTER — Telehealth: Payer: Self-pay | Admitting: Emergency Medicine

## 2022-07-29 NOTE — Telephone Encounter (Signed)
Incoming call from patient who reports that she fell down outside around 10:30am this morning. States she was carrying a car seat and tripped over the strap, and landing on her hands and knees. States that the fall was really hard, but she did not hit her stomach. Endorses +FM, denies ctx, VB or LOF at this time.  RN advised for patient to present to MAU for evaluation, and to continue to monitor fetal movement and other symptoms.

## 2022-08-02 ENCOUNTER — Inpatient Hospital Stay (HOSPITAL_COMMUNITY)
Admission: AD | Admit: 2022-08-02 | Discharge: 2022-08-02 | Disposition: A | Discharge status: Home / Self Care | Payer: Medicaid Other | Attending: Obstetrics & Gynecology | Admitting: Obstetrics & Gynecology

## 2022-08-02 ENCOUNTER — Encounter (HOSPITAL_COMMUNITY): Payer: Self-pay | Admitting: Obstetrics & Gynecology

## 2022-08-02 DIAGNOSIS — O479 False labor, unspecified: Secondary | ICD-10-CM | POA: Diagnosis not present

## 2022-08-02 DIAGNOSIS — Z3A34 34 weeks gestation of pregnancy: Secondary | ICD-10-CM | POA: Diagnosis not present

## 2022-08-02 DIAGNOSIS — Z3689 Encounter for other specified antenatal screening: Secondary | ICD-10-CM

## 2022-08-02 DIAGNOSIS — B3731 Acute candidiasis of vulva and vagina: Secondary | ICD-10-CM | POA: Insufficient documentation

## 2022-08-02 DIAGNOSIS — O23593 Infection of other part of genital tract in pregnancy, third trimester: Secondary | ICD-10-CM | POA: Diagnosis not present

## 2022-08-02 DIAGNOSIS — O98813 Other maternal infectious and parasitic diseases complicating pregnancy, third trimester: Secondary | ICD-10-CM | POA: Diagnosis not present

## 2022-08-02 DIAGNOSIS — N898 Other specified noninflammatory disorders of vagina: Secondary | ICD-10-CM | POA: Diagnosis present

## 2022-08-02 LAB — URINALYSIS, ROUTINE W REFLEX MICROSCOPIC
Bilirubin Urine: NEGATIVE
Glucose, UA: NEGATIVE mg/dL
Hgb urine dipstick: NEGATIVE
Ketones, ur: NEGATIVE mg/dL
Nitrite: NEGATIVE
Protein, ur: NEGATIVE mg/dL
Specific Gravity, Urine: 1.015 (ref 1.005–1.030)
pH: 6 (ref 5.0–8.0)

## 2022-08-02 LAB — WET PREP, GENITAL
Clue Cells Wet Prep HPF POC: NONE SEEN
Sperm: NONE SEEN
Trich, Wet Prep: NONE SEEN
WBC, Wet Prep HPF POC: >=10 — AB (ref <10)

## 2022-08-02 LAB — AMNISURE RUPTURE OF MEMBRANE (ROM) NOT AT ARMC: Amnisure ROM: NEGATIVE

## 2022-08-02 MED ORDER — LACTATED RINGERS IV BOLUS
1000.0000 mL | Freq: Once | INTRAVENOUS | Status: AC
  Administered 2022-08-02: 1000 mL via INTRAVENOUS

## 2022-08-02 MED ORDER — NIFEDIPINE 10 MG PO CAPS
10.0000 mg | ORAL_CAPSULE | ORAL | Status: DC | PRN
  Administered 2022-08-02: 10 mg via ORAL
  Filled 2022-08-02: qty 1

## 2022-08-02 MED ORDER — TERCONAZOLE 0.8 % VA CREA: 1.0000 | TOPICAL_CREAM | Freq: Every day | VAGINAL | 0 refills | Status: DC

## 2022-08-02 NOTE — MAU Provider Note (Signed)
History     CSN: [id number]  Arrival date and time: 08/02/22 0941   Event Date/Time   First Provider Initiated Contact with Patient 08/02/22 1028      Chief Complaint  Patient presents with   Rupture of Membranes   HPI Victoria Garrett is a 22 y.o. G3P2002 at [redacted]w[redacted]d. She presents to MAU with chief complaint of leaking of fluid. This is a new problem, onset this morning. Patient denies feeling of gush of fluid. She noted her underwear felt wet. She is remote from sexual intercourse.   Patient also report preterm contractions. She states she has been contracting for the past 2-3 days. Pain score is 3/10. She denies vaginal bleeding, leaking of fluid, decreased fetal movement, fever, falls, or recent illness.   Patient receives care with [facility].  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   Heart murmur    as a child    History of pyelonephritis during pregnancy 02/14/2020   Hydronephrosis, right 10/03/2015   Poor social situation 02/14/2020   Preterm uterine contractions in third trimester, antepartum 02/28/2020   Supervision of other normal pregnancy, antepartum 02/13/2020    Nursing Staff Provider Office Location [facility]  Dating   6w 5d [place] Language  ENGLISH Anatomy US   WNL visible in Care everywhere 10/2019 Flu Vaccine  02/14/2020 Genetic Screen  NIPS:   AFP:   First Screen:  Quad:   TDaP Vaccine   02/14/2020 Hgb A1C or  GTT Normal 1 hour 01/04/2020 per  [facility] COVID Vaccine NOT VACCINATED   LAB RESULTS  Rhogam   Blood Type    B POS Feeding Plan BREASTFEED A   UTI in pregnancy 03/02/2020   Vs bacteriuria. Treated 1/14. [ ]  TOC   Vaginal Pap smear, abnormal     Past Surgical History:  Procedure Laterality Date   IR NEPHROURETERAL CATH PLACE RIGHT Right 10/2015   NEPHROSTOMY TUBE PLACEMENT ([facility] HX)      Family History  Problem Relation Age of Onset   Healthy Mother    Healthy Father    Asthma Sister    Diabetes Maternal Grandmother     Social History   Tobacco Use   Smoking status: Never   Smokeless tobacco: Never  Vaping Use   Vaping Use: Former   Substances: Flavoring  Substance Use Topics   Alcohol use: Not Currently   Drug use: Yes    Types: Marijuana    Comment: used x one last week    Allergies: No Known Allergies  Medications Prior to Admission  Medication Sig Dispense Refill Last Dose   ondansetron (ZOFRAN-ODT) 8 MG disintegrating tablet Take 1  tablet (8 mg total) by mouth every 6 (six) hours as needed for nausea. 20 tablet 1 08/01/2022   pantoprazole (PROTONIX) 20 MG tablet Take 1 tablet (20 mg total) by mouth daily. 30 tablet 0 08/01/2022   Prenatal Vit-Fe Fumarate-FA (PRENATAL MULTIVITAMIN) TABS tablet Take 1 tablet by mouth daily. 30 tablet 0 08/01/2022   acetaminophen (TYLENOL) 500 MG tablet Take 2 tablets (1,000 mg total) by mouth every 6 (six) hours as needed. (Patient not taking: Reported on 06/12/2022) 30 tablet 0 Unknown   cyclobenzaprine (FLEXERIL) 10 MG tablet Take 1 tablet (10 mg total) by mouth 3 (three) times daily as needed for muscle spasms. 30 tablet 0 Unknown   hydrOXYzine (ATARAX) 50 MG tablet Take 1 tablet (50 mg total) by mouth every 6 (six) hours as needed for anxiety. (Patient not taking: Reported on 07/02/2022) 60 tablet 0 Unknown   metoCLOPramide (REGLAN) 10 MG tablet Take 1 tablet (10 mg total) by mouth every 6 (six) hours as needed for nausea or vomiting. 30 tablet 0 Unknown    mirtazapine (REMERON) 45 MG tablet Take 1 tablet (45 mg total) by mouth at bedtime. (Patient not taking: Reported on 07/02/2022) 30 tablet 0 Unknown   scopolamine (TRANSDERM-SCOP) 1 MG/3DAYS Place 1 patch (1.5 mg total) onto the skin every 3 (three) days. (Patient not taking: Reported on 06/17/2022) 3 patch 1 Unknown    Review of Systems  Gastrointestinal:  Positive for abdominal pain.  Genitourinary:  Positive for vaginal discharge.  All other systems reviewed and are negative.  Physical Exam   Blood pressure 116/70, pulse 97, temperature 98.7 F (37.1 C), temperature source Oral, resp. rate 17, height 5\' 2"  (1.575 m), weight 74.4 kg, last menstrual period 11/16/2021, SpO2 99 %.  Physical Exam Vitals and nursing note reviewed.  Constitutional:      General: She is not in acute distress.    Appearance: Normal appearance. She is obese. She is not ill-appearing.  Cardiovascular:     Rate and Rhythm: Normal rate.     Pulses: Normal pulses.  Pulmonary:     Effort: Pulmonary effort is normal.  Abdominal:     Comments: Gravid  Genitourinary:    Comments: Pelvic exam: External genitalia normal, vaginal walls pink and well rugated, cervix visually closed, no lesions noted. Thick white clumps of discharge noted throughout vault. No pooling, go gush with Valsalva.   Skin:    Capillary Refill: Capillary refill takes less than 2 seconds.  Neurological:     Mental Status: She is alert and oriented to person, place, and time.  Psychiatric:        Mood and Affect: Mood normal.        Behavior: Behavior normal.        Thought Content: Thought content normal.        Judgment: Judgment normal.     MAU Course  Procedures  MDM  --Reactive tracing: baseline 130, mod var, + accels, no decels --Toco: contractions q 3-7 min, palpate mild --1215: Notified by RN that patient is requesting discharge home  Orders Placed This Encounter  Procedures   Wet prep, genital   Amnisure rupture of  membrane (rom)not at [facility]   Urinalysis, Routine w reflex microscopic -Urine, Clean Catch   Insert peripheral IV   Discharge patient   Patient Vitals for the past 24 hrs:  BP Temp Temp src Pulse Resp SpO2 Height Weight  08/02/22 1235 128/77 98.4 F (36.9 C) Oral 86 17 100 % -- --  08/02/22 1211 116/70 -- -- -- -- -- -- --  08/02/22 1037 -- -- -- -- -- 99 % -- --  08/02/22 1019 126/82 98.7 F (37.1 C) Oral 97 17 99 % 5\' 2"  (1.575 m) 74.4 kg   Results for orders placed or performed during the hospital encounter of 08/02/22 (from the past 24 hour(s))  Urinalysis, Routine w reflex microscopic -Urine, Clean Catch     Status: Abnormal   Collection Time: 08/02/22  9:41 AM  Result Value Ref Range   Color, Urine YELLOW YELLOW   APPearance CLOUDY (A) CLEAR   Specific Gravity, Urine 1.015 1.005 - 1.030   pH 6.0 5.0 - 8.0   Glucose, UA NEGATIVE NEGATIVE mg/dL   Hgb urine dipstick NEGATIVE NEGATIVE   Bilirubin Urine NEGATIVE NEGATIVE   Ketones, ur NEGATIVE NEGATIVE mg/dL   Protein, ur NEGATIVE NEGATIVE mg/dL   Nitrite NEGATIVE NEGATIVE   Leukocytes,Ua LARGE (A) NEGATIVE   RBC / HPF 0-5 0 - 5 RBC/hpf   WBC, UA 0-5 0 - 5 WBC/hpf   Bacteria, UA RARE (A) NONE SEEN   Squamous Epithelial / HPF 11-20 0 - 5 /HPF   Mucus PRESENT    Hyaline Casts, UA PRESENT   Amnisure rupture of membrane (rom)not at [facility]     Status: None   Collection Time: 08/02/22 10:53 AM  Result Value Ref Range   Amnisure ROM NEGATIVE   Wet prep, genital     Status: Abnormal   Collection Time: 08/02/22 10:53 AM  Result Value Ref Range   Yeast Wet Prep HPF POC PRESENT (A) NONE SEEN   Trich, Wet Prep NONE SEEN NONE SEEN   Clue Cells Wet Prep HPF POC NONE SEEN NONE SEEN   WBC, Wet Prep HPF POC >=10 (A) <10   Sperm NONE SEEN    Meds ordered this encounter  Medications   lactated ringers bolus 1,000 mL   NIFEdipine (PROCARDIA) capsule 10 mg   terconazole (TERAZOL 3) 0.8 % vaginal cream    Sig: Place 1 applicator vaginally  at bedtime. Apply nightly for three nights.    Dispense:  20 g    Refill:  0    Order Specific Question:   Supervising Provider    Answer:   Adam Phenix [[id number]]   Assessment and Plan  --22 y.o. G3P2002 at [redacted]w[redacted]d  --Reactive tracing, closed cervix --Intact membranes --Denies pain at time of discharge --Vaginal yeast, prescription to pharmacy --Discharge home in stable condition  F/U: --Next appt at [facility] is 08/11/2022  Calvert Cantor, MSA, MSN, CNM 08/02/2022, 3:51 PM

## 2022-08-02 NOTE — MAU Note (Signed)
.  Victoria Garrett is a 21 y.o. at [redacted]w[redacted]d here in MAU reporting: ROM at 0800 clear fluid.  Pt reports no VB.  +FM Onset of complaint: 0800 Pain score: 3  Vitals:   08/02/22 1019  Pulse: 97  Resp: 17  Temp: 98.7 F (37.1 C)  SpO2: 99%     FHT:140  Lab orders placed from triage:    none

## 2022-08-04 LAB — GC/CHLAMYDIA PROBE AMP (~~LOC~~) NOT AT ARMC
Chlamydia: NEGATIVE
Comment: NEGATIVE
Comment: NORMAL
Neisseria Gonorrhea: NEGATIVE

## 2022-08-05 ENCOUNTER — Telehealth: Payer: Self-pay

## 2022-08-05 NOTE — Telephone Encounter (Signed)
Patient called stating that she has not felt fetal movement since about 7-8 pm last night. Also, reports abdominal pain last night which is not as bad as last night. Denies LOF and bleeding. Patient advised to go MAU for evaluation.

## 2022-08-11 ENCOUNTER — Ambulatory Visit (INDEPENDENT_AMBULATORY_CARE_PROVIDER_SITE_OTHER): Payer: Medicaid Other | Admitting: Obstetrics and Gynecology

## 2022-08-11 ENCOUNTER — Other Ambulatory Visit: Payer: Self-pay | Admitting: Obstetrics and Gynecology

## 2022-08-11 VITALS — BP 125/79 | HR 86 | Wt 174.0 lb

## 2022-08-11 DIAGNOSIS — O99323 Drug use complicating pregnancy, third trimester: Secondary | ICD-10-CM

## 2022-08-11 DIAGNOSIS — Z3A36 36 weeks gestation of pregnancy: Secondary | ICD-10-CM

## 2022-08-11 DIAGNOSIS — F1290 Cannabis use, unspecified, uncomplicated: Secondary | ICD-10-CM

## 2022-08-11 DIAGNOSIS — Z1339 Encounter for screening examination for other mental health and behavioral disorders: Secondary | ICD-10-CM | POA: Diagnosis not present

## 2022-08-11 DIAGNOSIS — O43193 Other malformation of placenta, third trimester: Secondary | ICD-10-CM

## 2022-08-11 DIAGNOSIS — O0993 Supervision of high risk pregnancy, unspecified, third trimester: Secondary | ICD-10-CM

## 2022-08-11 DIAGNOSIS — Z87440 Personal history of urinary (tract) infections: Secondary | ICD-10-CM

## 2022-08-11 DIAGNOSIS — O0990 Supervision of high risk pregnancy, unspecified, unspecified trimester: Secondary | ICD-10-CM

## 2022-08-11 DIAGNOSIS — O43192 Other malformation of placenta, second trimester: Secondary | ICD-10-CM

## 2022-08-11 DIAGNOSIS — O99320 Drug use complicating pregnancy, unspecified trimester: Secondary | ICD-10-CM

## 2022-08-11 DIAGNOSIS — D696 Thrombocytopenia, unspecified: Secondary | ICD-10-CM

## 2022-08-11 NOTE — Progress Notes (Signed)
ROB, c/o insomnia. 

## 2022-08-11 NOTE — Progress Notes (Signed)
   PRENATAL VISIT NOTE  Subjective:  Victoria Garrett is a 22 y.o. G3P2002 at [redacted]w[redacted]d being seen today for ongoing prenatal care.  She is currently monitored for the following issues for this low-risk pregnancy and has Major depressive disorder affecting pregnancy; PTSD (post-traumatic stress disorder); Severe recurrent major depression without psychotic features (HCC); Suicide attempt by drug overdose (HCC); Supervision of high risk pregnancy, antepartum; Obesity affecting pregnancy; Marijuana use during pregnancy; Marginal insertion of umbilical cord affecting management of mother in second trimester; Moderate cannabis use disorder (HCC); Nausea/vomiting in pregnancy; and Thrombocytopenia (HCC) on their problem list.  Patient doing well with no acute concerns today. She reports no complaints.  Contractions: Irritability. Vag. Bleeding: None.  Movement: Present. Denies leaking of fluid.   The following portions of the patient's history were reviewed and updated as appropriate: allergies, current medications, past family history, past medical history, past social history, past surgical history and problem list. Problem list updated.  Objective:   Vitals:   08/11/22 1041  BP: 125/79  Pulse: 86  Weight: 174 lb (78.9 kg)    Fetal Status: Fetal Heart Rate (bpm): 148 Fundal Height: 37 cm Movement: Present     General:  Alert, oriented and cooperative. Patient is in no acute distress.  Skin: Skin is warm and dry. No rash noted.   Cardiovascular: Normal heart rate noted  Respiratory: Normal respiratory effort, no problems with respiration noted  Abdomen: Soft, gravid, appropriate for gestational age.  Pain/Pressure: Present     Pelvic: Cervical exam performed Dilation: Fingertip Effacement (%): 60 Station: -2  Extremities: Normal range of motion.  Edema: Trace  Mental Status:  Normal mood and affect. Normal behavior. Normal judgment and thought content.   Assessment and Plan:  Pregnancy:  G3P2002 at [redacted]w[redacted]d  1. Supervision of high risk pregnancy, antepartum Continue routine prenatal care - Ambulatory referral to Integrated Behavioral Health - Culture, beta strep (group b only)  2. [redacted] weeks gestation of pregnancy   3. Thrombocytopenia (HCC) Will recheck plts today - CBC  4. Marijuana use during pregnancy   5. Marginal insertion of umbilical cord affecting management of mother in second trimester Growth WNL, no further scans noted from MFM  6. History of UTI Will recheck culture for TOC - Urine Culture  Preterm labor symptoms and general obstetric precautions including but not limited to vaginal bleeding, contractions, leaking of fluid and fetal movement were reviewed in detail with the patient.  Please refer to After Visit Summary for other counseling recommendations.   Return in about 1 week (around 08/18/2022) for ROB, in person.   Mariel Aloe, MD Faculty Attending Center for [facility]

## 2022-08-12 LAB — CBC
Hematocrit: 34.1 % (ref 34.0–46.6)
Hemoglobin: 10.7 g/dL — ABNORMAL LOW (ref 11.1–15.9)
MCH: 23.9 pg — ABNORMAL LOW (ref 26.6–33.0)
MCHC: 31.4 g/dL — ABNORMAL LOW (ref 31.5–35.7)
MCV: 76 fL — ABNORMAL LOW (ref 79–97)
Platelets: 108 10*3/uL — ABNORMAL LOW (ref 150–450)
RBC: 4.47 x10E6/uL (ref 3.77–5.28)
RDW: 14.4 % (ref 11.7–15.4)
WBC: 6.8 10*3/uL (ref 3.4–10.8)

## 2022-08-13 LAB — URINE CULTURE

## 2022-08-15 LAB — CULTURE, BETA STREP (GROUP B ONLY): Strep Gp B Culture: NEGATIVE

## 2022-08-18 ENCOUNTER — Ambulatory Visit (INDEPENDENT_AMBULATORY_CARE_PROVIDER_SITE_OTHER): Payer: Medicaid Other | Admitting: Licensed Clinical Social Worker

## 2022-08-18 ENCOUNTER — Ambulatory Visit (INDEPENDENT_AMBULATORY_CARE_PROVIDER_SITE_OTHER): Payer: Medicaid Other | Admitting: Student

## 2022-08-18 VITALS — BP 122/78 | HR 81 | Wt 172.0 lb

## 2022-08-18 DIAGNOSIS — O99343 Other mental disorders complicating pregnancy, third trimester: Secondary | ICD-10-CM

## 2022-08-18 DIAGNOSIS — O99213 Obesity complicating pregnancy, third trimester: Secondary | ICD-10-CM

## 2022-08-18 DIAGNOSIS — O219 Vomiting of pregnancy, unspecified: Secondary | ICD-10-CM

## 2022-08-18 DIAGNOSIS — O43193 Other malformation of placenta, third trimester: Secondary | ICD-10-CM

## 2022-08-18 DIAGNOSIS — Z87440 Personal history of urinary (tract) infections: Secondary | ICD-10-CM

## 2022-08-18 DIAGNOSIS — O99340 Other mental disorders complicating pregnancy, unspecified trimester: Secondary | ICD-10-CM

## 2022-08-18 DIAGNOSIS — F329 Major depressive disorder, single episode, unspecified: Secondary | ICD-10-CM

## 2022-08-18 DIAGNOSIS — O0990 Supervision of high risk pregnancy, unspecified, unspecified trimester: Secondary | ICD-10-CM

## 2022-08-18 DIAGNOSIS — Z3A37 37 weeks gestation of pregnancy: Secondary | ICD-10-CM

## 2022-08-18 DIAGNOSIS — D696 Thrombocytopenia, unspecified: Secondary | ICD-10-CM

## 2022-08-18 MED ORDER — METOCLOPRAMIDE HCL 10 MG PO TABS
10.0000 mg | ORAL_TABLET | Freq: Four times a day (QID) | ORAL | 0 refills | Status: DC | PRN
Start: 2022-08-18 — End: 2022-08-25

## 2022-08-18 MED ORDER — ONDANSETRON 8 MG PO TBDP
8.0000 mg | ORAL_TABLET | Freq: Four times a day (QID) | ORAL | 1 refills | Status: DC | PRN
Start: 2022-08-18 — End: 2022-08-25

## 2022-08-18 NOTE — Progress Notes (Signed)
Pt requesting refill on Zofran and Reglan.

## 2022-08-18 NOTE — Progress Notes (Signed)
PRENATAL VISIT NOTE  Subjective:  Victoria Garrett is a 22 y.o. G3P2002 at [redacted]w[redacted]d being seen today for ongoing prenatal care.  She is currently monitored for the following issues for this high-risk pregnancy and has Major depressive disorder affecting pregnancy; PTSD (post-traumatic stress disorder); Severe recurrent major depression without psychotic features (HCC); Suicide attempt by drug overdose (HCC); Supervision of high risk pregnancy, antepartum; Obesity affecting pregnancy; Marijuana use during pregnancy; Marginal insertion of umbilical cord affecting management of mother in second trimester; Moderate cannabis use disorder (HCC); Nausea/vomiting in pregnancy; and Thrombocytopenia (HCC) on their problem list.  Patient reports  having some questions about her umbilical chord .  Contractions: Not present. Vag. Bleeding: None.  Movement: Present. Denies leaking of fluid.   The following portions of the patient's history were reviewed and updated as appropriate: allergies, current medications, past family history, past medical history, past social history, past surgical history and problem list.   Objective:   Vitals:   08/18/22 0936  BP: 122/78  Pulse: 81  Weight: 172 lb (78 kg)    Fetal Status: Fetal Heart Rate (bpm): 133   Movement: Present     General:  Alert, oriented and cooperative. Patient is in no acute distress.  Skin: Skin is warm and dry. No rash noted.   Cardiovascular: Normal heart rate noted  Respiratory: Normal respiratory effort, no problems with respiration noted  Abdomen: Soft, gravid, appropriate for gestational age.  Pain/Pressure: Present     Pelvic: Cervical exam deferred        Extremities: Normal range of motion.  Edema: None  Mental Status: Normal mood and affect. Normal behavior. Normal judgment and thought content.   Assessment and Plan:  Pregnancy: G3P2002 at [redacted]w[redacted]d 1. Supervision of high risk pregnancy, antepartum - frequent and vigorous fetal  movement  2. [redacted] weeks gestation of pregnancy - continue weekly follow-up - eIOL requested, counseled patient that they will be placed on a wait list   3. Thrombocytopenia (HCC) - 108 at previous visit - Consider steroids near delivery  4. Marginal insertion of umbilical cord affecting management of mother in third trimester - no further scans per MFM  - Questions addressed   5. History of UTI - negative culture at previous visit  6. Obesity affecting pregnancy in third trimester, unspecified obesity type - BMI: 31.8  7. Nausea and vomiting during pregnancy - Mainly nausea, stable with meds - metoCLOPramide (REGLAN) 10 MG tablet; Take 1 tablet (10 mg total) by mouth every 6 (six) hours as needed for nausea or vomiting.  Dispense: 30 tablet; Refill: 0 - ondansetron (ZOFRAN-ODT) 8 MG disintegrating tablet; Take 1 tablet (8 mg total) by mouth every 6 (six) hours as needed for nausea.  Dispense: 20 tablet; Refill: 1  8. Major depressive disorder affecting pregnancy - support provided, encouraged patient to reach out for any needs - IBH appointment today   Preterm labor symptoms and general obstetric precautions including but not limited to vaginal bleeding, contractions, leaking of fluid and fetal movement were reviewed in detail with the patient. Please refer to After Visit Summary for other counseling recommendations.   No follow-ups on file.  Future Appointments  Date Time Provider Department Center  08/18/2022 10:00 AM Gwyndolyn Saxon, LCSW [facility] None  08/26/2022  9:35 AM Warden Fillers, MD [facility] None  09/01/2022  9:35 AM Adam Phenix, MD [facility] None  09/02/2022 10:00 AM Thomasene Ripple, DO [facility] None  09/08/2022  8:55 AM Lennart Pall, MD [facility]  None    Corlis Hove, NP

## 2022-08-25 ENCOUNTER — Encounter (HOSPITAL_COMMUNITY): Payer: Self-pay | Admitting: Family Medicine

## 2022-08-25 ENCOUNTER — Other Ambulatory Visit: Payer: Self-pay

## 2022-08-25 ENCOUNTER — Inpatient Hospital Stay (HOSPITAL_COMMUNITY)
Admission: AD | Admit: 2022-08-25 | Discharge: 2022-08-25 | Disposition: A | Discharge status: Home / Self Care | Payer: Medicaid Other | Attending: Family Medicine | Admitting: Family Medicine

## 2022-08-25 DIAGNOSIS — O26893 Other specified pregnancy related conditions, third trimester: Secondary | ICD-10-CM | POA: Diagnosis not present

## 2022-08-25 DIAGNOSIS — O23593 Infection of other part of genital tract in pregnancy, third trimester: Secondary | ICD-10-CM | POA: Insufficient documentation

## 2022-08-25 DIAGNOSIS — N3001 Acute cystitis with hematuria: Secondary | ICD-10-CM | POA: Insufficient documentation

## 2022-08-25 DIAGNOSIS — B9689 Other specified bacterial agents as the cause of diseases classified elsewhere: Secondary | ICD-10-CM | POA: Insufficient documentation

## 2022-08-25 DIAGNOSIS — O99323 Drug use complicating pregnancy, third trimester: Secondary | ICD-10-CM | POA: Diagnosis not present

## 2022-08-25 DIAGNOSIS — N760 Acute vaginitis: Secondary | ICD-10-CM

## 2022-08-25 DIAGNOSIS — O2313 Infections of bladder in pregnancy, third trimester: Secondary | ICD-10-CM | POA: Insufficient documentation

## 2022-08-25 DIAGNOSIS — Z3A38 38 weeks gestation of pregnancy: Secondary | ICD-10-CM | POA: Insufficient documentation

## 2022-08-25 HISTORY — DX: Thrombocytopenia, unspecified: D69.6

## 2022-08-25 LAB — URINALYSIS, ROUTINE W REFLEX MICROSCOPIC
Bilirubin Urine: NEGATIVE
Glucose, UA: NEGATIVE mg/dL
Hgb urine dipstick: NEGATIVE
Ketones, ur: NEGATIVE mg/dL
Nitrite: POSITIVE — AB
Protein, ur: 30 mg/dL — AB
Specific Gravity, Urine: 1.017 (ref 1.005–1.030)
WBC, UA: >50 WBC/hpf (ref 0–5)
pH: 5 (ref 5.0–8.0)

## 2022-08-25 LAB — WET PREP, GENITAL
Sperm: NONE SEEN
Trich, Wet Prep: NONE SEEN
WBC, Wet Prep HPF POC: <10 (ref <10)
Yeast Wet Prep HPF POC: NONE SEEN

## 2022-08-25 MED ORDER — CEFADROXIL 500 MG PO CAPS: 500.0000 mg | ORAL_CAPSULE | Freq: Two times a day (BID) | ORAL | 0 refills | Status: DC

## 2022-08-25 MED ORDER — METRONIDAZOLE 500 MG PO TABS: 500.0000 mg | ORAL_TABLET | Freq: Two times a day (BID) | ORAL | 0 refills | Status: DC

## 2022-08-25 NOTE — MAU Note (Signed)
Victoria Garrett is a 22 y.o. at [redacted]w[redacted]d here in MAU reporting: she's having constant sharp pelvic pain that began last night.  Reports pain is worse with activity and movement, states pain is sharp and at times shooting & stabbing.  Endorses irregular ctxs, denies VB or LOF.  Reports +FM. LMP: NA Onset of complaint: last night Pain score: 8 Vitals:   08/25/22 1746  BP: 124/77  Pulse: 88  Resp: 19  Temp: 98.1 F (36.7 C)  SpO2: 96%     FHT:145 bpm Lab orders placed from triage:   UA

## 2022-08-25 NOTE — BH Specialist Note (Signed)
Integrated Behavioral Health Follow Up In-Person Visit  MRN: [id number] Name: Victoria Garrett  Number of Integrated Behavioral Health Clinician visits: 3 Session Start time:  10:00am Session End time: 10:26am Total time in minutes: 26 mins in person at [facility]   Types of Service: General Behavioral Integrated Care (BHI)  Interpretor:No. Interpretor Name and Language: none  Subjective: Victoria Garrett is a 22 y.o. female accompanied by n/a Patient was referred by Florene Route Np for depressive symptoms and situational stressors . Patient reports the following symptoms/concerns: depressed mood, and stress.  Duration of problem: over one year; Severity of problem: mild  Objective: Mood: Depressed and Affect: Appropriate Risk of harm to self or others: No plan to harm self or others  Life Context: Family and Social: Lives with daughter and father of baby School/Work: full time employed  Self-Care: Rest  Life Changes: New pregnancy  Patient and/or Family's Strengths/Protective Factors: Concrete supports in place (healthy food, safe environments, etc.)  Goals Addressed: Patient will:  Reduce symptoms of: mood instability and stress   Increase knowledge and/or ability of: coping skills   Demonstrate ability to: Increase healthy adjustment to current life circumstances  Progress towards Goals: Ongoing  Interventions: Interventions utilized:  Supportive Counseling Standardized Assessments completed: PHQ 9  Patient and/or Family Response: [name] reports increase stress due to limited family and social support. [name] reports depressed mood, fatigue and decrease increase in doing things.   Assessment: Patient currently experiencing .   Patient may benefit from major depression affecting pregnancy.  Plan: Follow up with behavioral health clinician on : next ob visit  Behavioral recommendations: mindfulness, prioritize rest, stress reducing  activity such as yoga, physical activity, avoid conflict and journal writing  Referral(s): Integrated [facility] [facility] (In Clinic) "From scale of 1-10, how likely are you to follow plan?":    Gwyndolyn Saxon, LCSW

## 2022-08-25 NOTE — MAU Provider Note (Signed)
History     CSN: [id number]  Arrival date and time: 08/25/22 1658   Event Date/Time   First Provider Initiated Contact with Patient 08/25/22 1803      Chief Complaint  Patient presents with   Pelvic Pain   HPI This is a 22 year old G3 P2-0-0-2 at 38 weeks and 1 day presents with pelvic pain/pressure started since yesterday.  The pain increases when she walks and moves.,  Especially with the pain and her right hip and leg.  The pain is sharp at times.  She does have clumpy white discharge.  She thought that she had an infection, so she started taking her leftover MetroGel.  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   Heart murmur    as a child    History of pyelonephritis during pregnancy 02/14/2020   Hydronephrosis, right 10/03/2015   Poor social situation 02/14/2020   Preterm uterine contractions in third trimester, antepartum 02/28/2020   Supervision of other normal pregnancy, antepartum 02/13/2020    Nursing Staff Provider Office Location [facility]  Dating   6w 5d [place] Language  ENGLISH Anatomy US   WNL visible in Care everywhere 10/2019 Flu Vaccine  02/14/2020 Genetic Screen  NIPS:   AFP:   First Screen:  Quad:   TDaP Vaccine   02/14/2020 Hgb A1C or  GTT Normal 1 hour 01/04/2020 per [facility] COVID Vaccine NOT VACCINATED   LAB RESULTS  Rhogam   Blood Type    B POS Feeding Plan BREASTFEED A   Thrombocytopenia (HCC)    UTI in pregnancy 03/02/2020   Vs  bacteriuria. Treated 1/14. [ ]  TOC   Vaginal Pap smear, abnormal     Past Surgical History:  Procedure Laterality Date   IR NEPHROURETERAL CATH PLACE RIGHT Right 10/2015   NEPHROSTOMY TUBE PLACEMENT ([facility] HX)      Family History  Problem Relation Age of Onset   Healthy Mother    Healthy Father    Asthma Sister    Diabetes Maternal Grandmother     Social History   Tobacco Use   Smoking status: Never   Smokeless tobacco: Never  Vaping Use   Vaping Use: Former   Substances: Flavoring  Substance Use Topics   Alcohol use: Not Currently   Drug use: Yes    Types: Marijuana    Comment: last smoked 1st week July 2024    Allergies: No Known Allergies  Medications Prior to Admission  Medication Sig Dispense Refill Last Dose   pantoprazole (PROTONIX) 20 MG tablet Take 1 tablet (20 mg total) by mouth daily. 30 tablet 0 08/24/2022   Prenatal Vit-Fe Fumarate-FA (PRENATAL MULTIVITAMIN) TABS  tablet Take 1 tablet by mouth daily. 30 tablet 0 08/24/2022   metoCLOPramide (REGLAN) 10 MG tablet Take 1 tablet (10 mg total) by mouth every 6 (six) hours as needed for nausea or vomiting. 30 tablet 0    ondansetron (ZOFRAN-ODT) 8 MG disintegrating tablet Take 1 tablet (8 mg total) by mouth every 6 (six) hours as needed for nausea. 20 tablet 1     Review of Systems Physical Exam   Blood pressure 133/81, pulse 86, temperature 98.1 F (36.7 C), temperature source Oral, resp. rate 19, last menstrual period 11/16/2021, SpO2 98 %.  Physical Exam Vitals reviewed.  Abdominal:     General: Abdomen is flat.     Palpations: Abdomen is soft.     Tenderness: There is no abdominal tenderness. There is no guarding or rebound.  Musculoskeletal:     Comments: Mild tenderness to pubic bone  Skin:    Capillary Refill: Capillary refill takes less than 2 seconds.  Neurological:     General: No focal deficit present.  Psychiatric:        Mood and Affect: Mood normal.        Behavior: Behavior normal.         Thought Content: Thought content normal.        Judgment: Judgment normal.    Results for orders placed or performed during the hospital encounter of 08/25/22 (from the past 24 hour(s))  Urinalysis, Routine w reflex microscopic -Urine, Clean Catch     Status: Abnormal   Collection Time: 08/25/22  6:00 PM  Result Value Ref Range   Color, Urine AMBER (A) YELLOW   APPearance CLOUDY (A) CLEAR   Specific Gravity, Urine 1.017 1.005 - 1.030   pH 5.0 5.0 - 8.0   Glucose, UA NEGATIVE NEGATIVE mg/dL   Hgb urine dipstick NEGATIVE NEGATIVE   Bilirubin Urine NEGATIVE NEGATIVE   Ketones, ur NEGATIVE NEGATIVE mg/dL   Protein, ur 30 (A) NEGATIVE mg/dL   Nitrite POSITIVE (A) NEGATIVE   Leukocytes,Ua MODERATE (A) NEGATIVE   RBC / HPF 11-20 0 - 5 RBC/hpf   WBC, UA >50 0 - 5 WBC/hpf   Bacteria, UA MANY (A) NONE SEEN   Squamous Epithelial / HPF 0-5 0 - 5 /HPF   Mucus PRESENT   Wet prep, genital     Status: Abnormal   Collection Time: 08/25/22  6:10 PM   Specimen: Vaginal  Result Value Ref Range   Yeast Wet Prep HPF POC NONE SEEN NONE SEEN   Trich, Wet Prep NONE SEEN NONE SEEN   Clue Cells Wet Prep HPF POC PRESENT (A) NONE SEEN   WBC, Wet Prep HPF POC <10 <10   Sperm NONE SEEN      MAU Course  Procedures  MDM Check UA, wet prep.  Assessment and Plan   1. [redacted] weeks gestation of pregnancy   2. Acute cystitis with hematuria   3. BV (bacterial vaginosis)    Duricef and flagyl prescribed. Discharge to home.  Levie Heritage 08/25/2022, 6:08 PM

## 2022-08-26 ENCOUNTER — Ambulatory Visit (INDEPENDENT_AMBULATORY_CARE_PROVIDER_SITE_OTHER): Payer: Medicaid Other | Admitting: Obstetrics and Gynecology

## 2022-08-26 VITALS — BP 119/77 | HR 78 | Wt 170.0 lb

## 2022-08-26 DIAGNOSIS — O0993 Supervision of high risk pregnancy, unspecified, third trimester: Secondary | ICD-10-CM

## 2022-08-26 DIAGNOSIS — Z3A38 38 weeks gestation of pregnancy: Secondary | ICD-10-CM

## 2022-08-26 DIAGNOSIS — O43192 Other malformation of placenta, second trimester: Secondary | ICD-10-CM

## 2022-08-26 DIAGNOSIS — F1290 Cannabis use, unspecified, uncomplicated: Secondary | ICD-10-CM

## 2022-08-26 DIAGNOSIS — O43193 Other malformation of placenta, third trimester: Secondary | ICD-10-CM

## 2022-08-26 DIAGNOSIS — O219 Vomiting of pregnancy, unspecified: Secondary | ICD-10-CM

## 2022-08-26 DIAGNOSIS — O99323 Drug use complicating pregnancy, third trimester: Secondary | ICD-10-CM

## 2022-08-26 DIAGNOSIS — D696 Thrombocytopenia, unspecified: Secondary | ICD-10-CM

## 2022-08-26 DIAGNOSIS — Z3A37 37 weeks gestation of pregnancy: Secondary | ICD-10-CM

## 2022-08-26 DIAGNOSIS — O0990 Supervision of high risk pregnancy, unspecified, unspecified trimester: Secondary | ICD-10-CM

## 2022-08-26 MED ORDER — ONDANSETRON 8 MG PO TBDP
8.0000 mg | ORAL_TABLET | Freq: Four times a day (QID) | ORAL | 1 refills | Status: DC | PRN
Start: 2022-08-26 — End: 2022-09-02

## 2022-08-26 NOTE — Progress Notes (Signed)
Pt wa seen at MAU yesterday, given tx for BV and UTI - pt has to pick up Rx's. Pt complains of lower pelvic pain/pressure.

## 2022-08-26 NOTE — Progress Notes (Signed)
   PRENATAL VISIT NOTE  Subjective:  Victoria Garrett is a 22 y.o. G3P2002 at [redacted]w[redacted]d being seen today for ongoing prenatal care.  She is currently monitored for the following issues for this high-risk pregnancy and has Major depressive disorder affecting pregnancy; PTSD (post-traumatic stress disorder); Severe recurrent major depression without psychotic features (HCC); Suicide attempt by drug overdose (HCC); Supervision of high risk pregnancy, antepartum; Obesity affecting pregnancy; Marijuana use during pregnancy; Marginal insertion of umbilical cord affecting management of mother in second trimester; Moderate cannabis use disorder (HCC); Nausea/vomiting in pregnancy; and Thrombocytopenia (HCC) on their problem list.  Patient doing well with no acute concerns today. She reports nausea, occasional contractions, and pelvic pressure .  Contractions: Irregular. Vag. Bleeding: None.  Movement: Present. Denies leaking of fluid.   The following portions of the patient's history were reviewed and updated as appropriate: allergies, current medications, past family history, past medical history, past social history, past surgical history and problem list. Problem list updated.  Objective:   Vitals:   08/26/22 0942  BP: 119/77  Pulse: 78  Weight: 170 lb (77.1 kg)    Fetal Status: Fetal Heart Rate (bpm): 140 Fundal Height: 38 cm Movement: Present     General:  Alert, oriented and cooperative. Patient is in no acute distress.  Skin: Skin is warm and dry. No rash noted.   Cardiovascular: Normal heart rate noted  Respiratory: Normal respiratory effort, no problems with respiration noted  Abdomen: Soft, gravid, appropriate for gestational age.  Pain/Pressure: Present     Pelvic: Cervical exam performed Dilation: 2 Effacement (%): 70 Station: -3  Extremities: Normal range of motion.     Mental Status:  Normal mood and affect. Normal behavior. Normal judgment and thought content.   Assessment and  Plan:  Pregnancy: G3P2002 at [redacted]w[redacted]d  1. [redacted] weeks gestation of pregnancy   2. Thrombocytopenia (HCC) Last plts 108, spoke with Adrian Blackwater, MD. He recommended weekly plt check with possible steroids if plts got around 90 - CBC  3. Supervision of high risk pregnancy, antepartum Continue routine prenatal care Discuss IOL, possible membrane stripping at next visit  4. Marijuana use during pregnancy   5. Marginal insertion of umbilical cord affecting management of mother in second trimester   Term labor symptoms and general obstetric precautions including but not limited to vaginal bleeding, contractions, leaking of fluid and fetal movement were reviewed in detail with the patient.  Please refer to After Visit Summary for other counseling recommendations.   Return in about 1 week (around 09/02/2022) for [facility], in person.   Mariel Aloe, MD Faculty Attending Center for [facility]

## 2022-08-27 LAB — CBC
Hematocrit: 34.9 % (ref 34.0–46.6)
Hemoglobin: 11.1 g/dL (ref 11.1–15.9)
MCH: 23.7 pg — ABNORMAL LOW (ref 26.6–33.0)
MCHC: 31.8 g/dL (ref 31.5–35.7)
MCV: 74 fL — ABNORMAL LOW (ref 79–97)
Platelets: 114 10*3/uL — ABNORMAL LOW (ref 150–450)
RBC: 4.69 x10E6/uL (ref 3.77–5.28)
RDW: 14.7 % (ref 11.7–15.4)
WBC: 8.3 10*3/uL (ref 3.4–10.8)

## 2022-08-30 ENCOUNTER — Inpatient Hospital Stay (EMERGENCY_DEPARTMENT_HOSPITAL)
Admission: AD | Admit: 2022-08-30 | Discharge: 2022-08-30 | Disposition: A | Discharge status: Home / Self Care | Payer: Medicaid Other | Source: Home / Self Care | Attending: Obstetrics & Gynecology | Admitting: Obstetrics & Gynecology

## 2022-08-30 ENCOUNTER — Other Ambulatory Visit: Payer: Self-pay

## 2022-08-30 ENCOUNTER — Encounter (HOSPITAL_COMMUNITY): Payer: Self-pay | Admitting: Obstetrics & Gynecology

## 2022-08-30 DIAGNOSIS — L292 Pruritus vulvae: Secondary | ICD-10-CM | POA: Diagnosis not present

## 2022-08-30 DIAGNOSIS — O26893 Other specified pregnancy related conditions, third trimester: Secondary | ICD-10-CM | POA: Insufficient documentation

## 2022-08-30 DIAGNOSIS — Z3A39 39 weeks gestation of pregnancy: Secondary | ICD-10-CM | POA: Insufficient documentation

## 2022-08-30 DIAGNOSIS — B3731 Acute candidiasis of vulva and vagina: Secondary | ICD-10-CM

## 2022-08-30 DIAGNOSIS — Z87891 Personal history of nicotine dependence: Secondary | ICD-10-CM | POA: Insufficient documentation

## 2022-08-30 DIAGNOSIS — O23593 Infection of other part of genital tract in pregnancy, third trimester: Secondary | ICD-10-CM | POA: Insufficient documentation

## 2022-08-30 DIAGNOSIS — Z3A38 38 weeks gestation of pregnancy: Secondary | ICD-10-CM

## 2022-08-30 DIAGNOSIS — R030 Elevated blood-pressure reading, without diagnosis of hypertension: Secondary | ICD-10-CM | POA: Insufficient documentation

## 2022-08-30 DIAGNOSIS — O471 False labor at or after 37 completed weeks of gestation: Secondary | ICD-10-CM | POA: Insufficient documentation

## 2022-08-30 LAB — URINALYSIS, ROUTINE W REFLEX MICROSCOPIC
Bilirubin Urine: NEGATIVE
Glucose, UA: NEGATIVE mg/dL
Hgb urine dipstick: NEGATIVE
Ketones, ur: NEGATIVE mg/dL
Nitrite: NEGATIVE
Protein, ur: NEGATIVE mg/dL
Specific Gravity, Urine: 1.019 (ref 1.005–1.030)
pH: 6 (ref 5.0–8.0)

## 2022-08-30 MED ORDER — TERCONAZOLE 0.4 % VA CREA: 1.0000 | TOPICAL_CREAM | Freq: Every day | VAGINAL | 0 refills | Status: DC

## 2022-08-30 NOTE — MAU Provider Note (Signed)
Chief Complaint:  Vaginal Discharge and Contractions   Event Date/Time   First Provider Initiated Contact with Patient 08/30/22 2258     HPI: Victoria Garrett is a 22 y.o. G3P2002 at 60w6dwho presents to maternity admissions reporting green clumpy discharge with perineal itching and two bumps near clitoris and inside vagina.  Intermittent contractions  . She reports good fetal movement, denies vaginal bleeding, urinary symptoms, h/a, dizziness, n/v, diarrhea, constipation or fever/chills.    Vaginal Discharge The patient's primary symptoms include genital itching and vaginal discharge. The patient's pertinent negatives include no pelvic pain or vaginal bleeding. The problem occurs constantly. The problem has been unchanged. The problem affects both sides. She is pregnant. Pertinent negatives include no abdominal pain, back pain, chills, diarrhea, dysuria or frequency. The vaginal discharge was green and thick. There has been no bleeding. She has not been passing clots. She has not been passing tissue. Nothing aggravates the symptoms. She has tried nothing for the symptoms.   RN Note: Victoria Garrett is a 22 y.o. at 100w6d here in MAU reporting: tested positive for UTI and BV at last visit - started antibiotics 3 days ago. Concerned today because vaginal discharge changed from white to green - clumpy; denies odor. Ctx for the past couple days - has not been timing them. Reports chills and congestion for the past couple of days - denies fever. Unsure if water is broken - has had watery discharge after waking up for the past 2 days. Reports noticed 2 pimple like bumps in vaginal area since infection started. Denies hx of HSV.   Past Medical History: Past Medical History:  Diagnosis Date   Anemia    Anxiety    ASCUS with positive high risk HPV cervical 04/11/2021   Benign gestational thrombocytopenia in third trimester (HCC) 02/28/2020   Plts 135 at new OB, then 127 at 28 weeks Check  platelets at 36 week visit [ ]     Deliberate self-cutting 03/20/2020   Depression    Generalized anxiety disorder with panic attacks 02/14/2020   Heart murmur    as a child    History of pyelonephritis during pregnancy 02/14/2020   Hydronephrosis, right 10/03/2015   Poor social situation 02/14/2020   Preterm uterine contractions in third trimester, antepartum 02/28/2020   Supervision of other normal pregnancy, antepartum 02/13/2020    Nursing Staff Provider Office Location [facility]  Dating   6w 5d [place] Language  ENGLISH Anatomy US   WNL visible in Care everywhere 10/2019 Flu Vaccine  02/14/2020 Genetic Screen  NIPS:   AFP:   First Screen:  Quad:   TDaP Vaccine   02/14/2020 Hgb A1C or  GTT Normal 1 hour 01/04/2020 per [facility] COVID Vaccine NOT VACCINATED   LAB RESULTS  Rhogam   Blood Type    B POS Feeding Plan BREASTFEED A   Thrombocytopenia (HCC)    UTI in pregnancy 03/02/2020   Vs bacteriuria. Treated 1/14. [ ]  TOC   Vaginal Pap smear, abnormal     Past obstetric history: OB History  Gravida Para Term Preterm AB Living  3 2 2     2   SAB IAB Ectopic Multiple Live Births        0 2    # Outcome Date GA Lbr Len/2nd Weight Sex Type Anes PTL Lv  3 Current           2 Term 03/27/20 [redacted]w[redacted]d 01:22 / 00:20 3311 g F Vag-Spont EPI  LIV  1 Term  02/05/16    M Vag-Spont   LIV    Past Surgical History: Past Surgical History:  Procedure Laterality Date   IR NEPHROURETERAL CATH PLACE RIGHT Right 10/2015   NEPHROSTOMY TUBE PLACEMENT ([facility] HX)      Family History: Family History  Problem Relation Age of Onset   Healthy Mother    Healthy Father    Asthma Sister    Diabetes Maternal Grandmother     Social History: Social History   Tobacco Use   Smoking status: Never   Smokeless tobacco: Never  Vaping Use   Vaping status: Former   Substances: Flavoring  Substance Use Topics   Alcohol use: Not Currently   Drug use: Not Currently    Types: Marijuana    Comment: last smoked 1st  week July 2024    Allergies: No Known Allergies  Meds:  Medications Prior to Admission  Medication Sig Dispense Refill Last Dose   cefadroxil (DURICEF) 500 MG capsule Take 1 capsule (500 mg total) by mouth 2 (two) times daily. 14 capsule 0 08/30/2022   metroNIDAZOLE (FLAGYL) 500 MG tablet Take 1 tablet (500 mg total) by mouth 2 (two) times daily. 14 tablet 0 08/30/2022   pantoprazole (PROTONIX) 20 MG tablet Take 1 tablet (20 mg total) by mouth daily. 30 tablet 0 08/29/2022   Prenatal Vit-Fe Fumarate-FA (PRENATAL MULTIVITAMIN) TABS tablet Take 1 tablet by mouth daily. 30 tablet 0 08/29/2022   ondansetron (ZOFRAN-ODT) 8 MG disintegrating tablet Take 1 tablet (8 mg total) by mouth every 6 (six) hours as needed for nausea. 20 tablet 1     I have reviewed patient's Past Medical Hx, Surgical Hx, Family Hx, Social Hx, medications and allergies.   ROS:  Review of Systems  Constitutional:  Negative for chills.  Gastrointestinal:  Negative for abdominal pain and diarrhea.  Genitourinary:  Positive for vaginal discharge. Negative for dysuria, frequency and pelvic pain.  Musculoskeletal:  Negative for back pain.   Other systems negative  Physical Exam   Vitals:   08/30/22 2205 08/30/22 2323  BP: 129/89 134/81  Pulse: 96 81  Resp: 20   Temp: 98.4 F (36.9 C)   TempSrc: Oral   SpO2: 99%   Weight: 79.3 kg   Height: 5\' 2"  (1.575 m)    Constitutional: Well-developed, well-nourished female in no acute distress.  Cardiovascular: normal rate  Respiratory: normal effort GI: Abd soft, non-tender, gravid appropriate for gestational age.   No rebound or guarding. MS: Extremities nontender, no edema, normal ROM Neurologic: Alert and oriented x 4.  GU: Neg CVAT.  PELVIC EXAM: Cervix pink, visually closed, without lesion, white/green clumpy discharge (consistent with yeast), vaginal walls and external genitalia normal    I did not find any bumps or masses on vulva  FHT:  Baseline 140 , moderate  variability, accelerations present, no decelerations Contractions:  Irregular     Labs: No results found for this or any previous visit (from the past 24 hour(s)). B/Positive/-- (04/30 1610)  Imaging:  No results found.  MAU Course/MDM: I have reviewed the triage vital signs and the nursing notes.   Pertinent labs & imaging results that were available during my care of the patient were reviewed by me and considered in my medical decision making (see chart for details).      I have reviewed her medical records including past results, notes and treatments.    NST reviewed  Treatments in MAU included EFM.    Assessment: Single IUP at [redacted]w[redacted]d  Vaginal yeast infection No evidence of vulvar lesions Single elevated BP with normal BP on repeat  Plan: Discharge home Rx Terazol 7 cream for yeast.  Labor precautions and fetal kick counts Follow up in Office for prenatal visits and recheck Encouraged to return if she develops worsening of symptoms, increase in pain, fever, or other concerning symptoms.   Pt stable at time of discharge.  Wynelle Bourgeois CNM, MSN Certified Nurse-Midwife 08/30/2022 10:58 PM

## 2022-08-30 NOTE — MAU Note (Signed)
.  Victoria Garrett is a 22 y.o. at [redacted]w[redacted]d here in MAU reporting: tested positive for UTI and BV at last visit - started antibiotics 3 days ago. Concerned today because vaginal discharge changed from white to green - clumpy; denies odor. Ctx for the past couple days - has not been timing them. Reports chills and congestion for the past couple of days - denies fever. Unsure if water is broken - has had watery discharge after waking up for the past 2 days. Reports noticed 2 pimple like bumps in vaginal area since infection started. Denies hx of HSV.   Pain score: 7 - ctx Vitals:   08/30/22 2205  BP: 129/89  Pulse: 96  Resp: 20  Temp: 98.4 F (36.9 C)  SpO2: 99%     FHT:144 Lab orders placed from triage:  UA

## 2022-08-31 ENCOUNTER — Other Ambulatory Visit: Payer: Self-pay

## 2022-08-31 ENCOUNTER — Encounter (HOSPITAL_COMMUNITY): Payer: Self-pay | Admitting: Family Medicine

## 2022-08-31 ENCOUNTER — Inpatient Hospital Stay (HOSPITAL_COMMUNITY): Payer: Medicaid Other | Admitting: Anesthesiology

## 2022-08-31 ENCOUNTER — Inpatient Hospital Stay (HOSPITAL_COMMUNITY)
Admission: RE | Admit: 2022-08-31 | Discharge: 2022-09-02 | DRG: 806 | Disposition: A | Discharge status: Home / Self Care | Payer: Medicaid Other | Attending: Obstetrics & Gynecology | Admitting: Obstetrics & Gynecology

## 2022-08-31 DIAGNOSIS — O26893 Other specified pregnancy related conditions, third trimester: Secondary | ICD-10-CM | POA: Diagnosis present

## 2022-08-31 DIAGNOSIS — O43123 Velamentous insertion of umbilical cord, third trimester: Secondary | ICD-10-CM | POA: Diagnosis present

## 2022-08-31 DIAGNOSIS — O43192 Other malformation of placenta, second trimester: Secondary | ICD-10-CM | POA: Diagnosis present

## 2022-08-31 DIAGNOSIS — O0990 Supervision of high risk pregnancy, unspecified, unspecified trimester: Secondary | ICD-10-CM

## 2022-08-31 DIAGNOSIS — O99893 Other specified diseases and conditions complicating puerperium: Secondary | ICD-10-CM | POA: Diagnosis not present

## 2022-08-31 DIAGNOSIS — F4310 Post-traumatic stress disorder, unspecified: Secondary | ICD-10-CM | POA: Diagnosis present

## 2022-08-31 DIAGNOSIS — Z3A39 39 weeks gestation of pregnancy: Secondary | ICD-10-CM | POA: Diagnosis not present

## 2022-08-31 DIAGNOSIS — D693 Immune thrombocytopenic purpura: Secondary | ICD-10-CM | POA: Diagnosis present

## 2022-08-31 DIAGNOSIS — R030 Elevated blood-pressure reading, without diagnosis of hypertension: Secondary | ICD-10-CM | POA: Diagnosis present

## 2022-08-31 DIAGNOSIS — O9912 Other diseases of the blood and blood-forming organs and certain disorders involving the immune mechanism complicating childbirth: Principal | ICD-10-CM | POA: Diagnosis present

## 2022-08-31 DIAGNOSIS — O4423 Partial placenta previa NOS or without hemorrhage, third trimester: Secondary | ICD-10-CM | POA: Diagnosis not present

## 2022-08-31 DIAGNOSIS — O23593 Infection of other part of genital tract in pregnancy, third trimester: Secondary | ICD-10-CM | POA: Diagnosis present

## 2022-08-31 DIAGNOSIS — B3731 Acute candidiasis of vulva and vagina: Secondary | ICD-10-CM | POA: Diagnosis present

## 2022-08-31 DIAGNOSIS — O99344 Other mental disorders complicating childbirth: Secondary | ICD-10-CM | POA: Diagnosis not present

## 2022-08-31 DIAGNOSIS — Z87891 Personal history of nicotine dependence: Secondary | ICD-10-CM | POA: Diagnosis not present

## 2022-08-31 DIAGNOSIS — O99214 Obesity complicating childbirth: Secondary | ICD-10-CM | POA: Diagnosis present

## 2022-08-31 DIAGNOSIS — M5430 Sciatica, unspecified side: Secondary | ICD-10-CM | POA: Diagnosis not present

## 2022-08-31 DIAGNOSIS — M25551 Pain in right hip: Secondary | ICD-10-CM | POA: Diagnosis not present

## 2022-08-31 DIAGNOSIS — O471 False labor at or after 37 completed weeks of gestation: Secondary | ICD-10-CM | POA: Diagnosis present

## 2022-08-31 DIAGNOSIS — D696 Thrombocytopenia, unspecified: Secondary | ICD-10-CM | POA: Diagnosis present

## 2022-08-31 DIAGNOSIS — Z3A38 38 weeks gestation of pregnancy: Secondary | ICD-10-CM | POA: Diagnosis not present

## 2022-08-31 DIAGNOSIS — F329 Major depressive disorder, single episode, unspecified: Secondary | ICD-10-CM | POA: Diagnosis present

## 2022-08-31 DIAGNOSIS — L292 Pruritus vulvae: Secondary | ICD-10-CM | POA: Diagnosis not present

## 2022-08-31 DIAGNOSIS — O99324 Drug use complicating childbirth: Secondary | ICD-10-CM | POA: Diagnosis not present

## 2022-08-31 LAB — CBC
HCT: 33.9 % — ABNORMAL LOW (ref 36.0–46.0)
Hemoglobin: 11 g/dL — ABNORMAL LOW (ref 12.0–15.0)
MCH: 24.7 pg — ABNORMAL LOW (ref 26.0–34.0)
MCHC: 32.4 g/dL (ref 30.0–36.0)
MCV: 76.2 fL — ABNORMAL LOW (ref 80.0–100.0)
Platelets: 120 10*3/uL — ABNORMAL LOW (ref 150–400)
RBC: 4.45 MIL/uL (ref 3.87–5.11)
RDW: 15 % (ref 11.5–15.5)
WBC: 7 10*3/uL (ref 4.0–10.5)
nRBC: 0 % (ref 0.0–0.2)

## 2022-08-31 LAB — TYPE AND SCREEN
ABO/RH(D): B POS
Antibody Screen: NEGATIVE

## 2022-08-31 LAB — HIV ANTIBODY (ROUTINE TESTING W REFLEX): HIV Screen 4th Generation wRfx: NONREACTIVE

## 2022-08-31 MED ORDER — PHENYLEPHRINE 80 MCG/ML (10ML) SYRINGE FOR IV PUSH (FOR BLOOD PRESSURE SUPPORT): 80.0000 ug | PREFILLED_SYRINGE | INTRAVENOUS | Status: DC | PRN

## 2022-08-31 MED ORDER — OXYCODONE-ACETAMINOPHEN 5-325 MG PO TABS: 1.0000 | ORAL_TABLET | ORAL | Status: DC | PRN

## 2022-08-31 MED ORDER — OXYCODONE-ACETAMINOPHEN 5-325 MG PO TABS: 2.0000 | ORAL_TABLET | ORAL | Status: DC | PRN

## 2022-08-31 MED ORDER — EPHEDRINE 5 MG/ML INJ: 10.0000 mg | INTRAVENOUS | Status: DC | PRN

## 2022-08-31 MED ORDER — ZOLPIDEM TARTRATE 5 MG PO TABS: 5.0000 mg | ORAL_TABLET | Freq: Every evening | ORAL | Status: DC | PRN

## 2022-08-31 MED ORDER — TERBUTALINE SULFATE 1 MG/ML IJ SOLN: 0.2500 mg | Freq: Once | INTRAMUSCULAR | Status: DC | PRN

## 2022-08-31 MED ORDER — FLEET ENEMA 7-19 GM/118ML RE ENEM: 1.0000 | ENEMA | RECTAL | Status: DC | PRN

## 2022-08-31 MED ORDER — DIPHENHYDRAMINE HCL 50 MG/ML IJ SOLN: 12.5000 mg | INTRAMUSCULAR | Status: DC | PRN

## 2022-08-31 MED ORDER — OXYTOCIN-SODIUM CHLORIDE 30-0.9 UT/500ML-% IV SOLN
2.5000 [IU]/h | INTRAVENOUS | Status: DC
  Administered 2022-09-01: 2.5 [IU]/h via INTRAVENOUS

## 2022-08-31 MED ORDER — LACTATED RINGERS IV SOLN
500.0000 mL | Freq: Once | INTRAVENOUS | Status: AC
  Administered 2022-08-31: 500 mL via INTRAVENOUS

## 2022-08-31 MED ORDER — LIDOCAINE HCL (PF) 1 % IJ SOLN
INTRAMUSCULAR | Status: DC | PRN
  Administered 2022-08-31: 3 mL via EPIDURAL
  Administered 2022-08-31: 5 mL via EPIDURAL

## 2022-08-31 MED ORDER — OXYTOCIN BOLUS FROM INFUSION
333.0000 mL | Freq: Once | INTRAVENOUS | Status: AC
  Administered 2022-09-01: 333 mL via INTRAVENOUS

## 2022-08-31 MED ORDER — FENTANYL CITRATE (PF) 100 MCG/2ML IJ SOLN: 100.0000 ug | INTRAMUSCULAR | Status: DC | PRN

## 2022-08-31 MED ORDER — LACTATED RINGERS IV SOLN: INTRAVENOUS | Status: DC

## 2022-08-31 MED ORDER — HYDROXYZINE HCL 50 MG PO TABS: 50.0000 mg | ORAL_TABLET | Freq: Four times a day (QID) | ORAL | Status: DC | PRN

## 2022-08-31 MED ORDER — FENTANYL-BUPIVACAINE-NACL 0.5-0.125-0.9 MG/250ML-% EP SOLN
12.0000 mL/h | EPIDURAL | Status: DC | PRN
  Administered 2022-08-31: 12 mL/h via EPIDURAL
  Filled 2022-08-31: qty 250

## 2022-08-31 MED ORDER — OXYTOCIN-SODIUM CHLORIDE 30-0.9 UT/500ML-% IV SOLN
1.0000 m[IU]/min | INTRAVENOUS | Status: DC
  Administered 2022-08-31: 2 m[IU]/min via INTRAVENOUS
  Filled 2022-08-31: qty 500

## 2022-08-31 MED ORDER — OXYTOCIN-SODIUM CHLORIDE 30-0.9 UT/500ML-% IV SOLN: 1.0000 m[IU]/min | INTRAVENOUS | Status: DC

## 2022-08-31 MED ORDER — ACETAMINOPHEN 325 MG PO TABS
650.0000 mg | ORAL_TABLET | ORAL | Status: DC | PRN
  Administered 2022-09-01: 650 mg via ORAL
  Filled 2022-08-31: qty 2

## 2022-08-31 MED ORDER — LACTATED RINGERS IV SOLN: 500.0000 mL | INTRAVENOUS | Status: DC | PRN

## 2022-08-31 MED ORDER — ONDANSETRON HCL 4 MG/2ML IJ SOLN
4.0000 mg | Freq: Four times a day (QID) | INTRAMUSCULAR | Status: DC | PRN
  Administered 2022-08-31 – 2022-09-01 (×2): 4 mg via INTRAVENOUS
  Filled 2022-08-31 (×2): qty 2

## 2022-08-31 MED ORDER — SOD CITRATE-CITRIC ACID 500-334 MG/5ML PO SOLN
30.0000 mL | ORAL | Status: DC | PRN
  Administered 2022-08-31 (×2): 30 mL via ORAL
  Filled 2022-08-31 (×2): qty 30

## 2022-08-31 MED ORDER — LIDOCAINE HCL (PF) 1 % IJ SOLN: 30.0000 mL | INTRAMUSCULAR | Status: DC | PRN

## 2022-08-31 NOTE — Progress Notes (Signed)
Labor Progress Note Victoria Garrett is a 22 y.o. G3P2002 at [redacted]w[redacted]d presented for eIOL  S: No acute concerns.   O:  BP 136/89   Pulse 77   Temp 98.6 F (37 C) (Oral)   Resp 18   Ht 5\' 2"  (1.575 m)   Wt 79.7 kg   LMP 11/16/2021 (Exact Date)   SpO2 98%   BMI 32.12 kg/m  EFM: 130bpm/moderate/+accels, no decels  CVE: Dilation: 4.5 Effacement (%): 80 Cervical Position: Posterior Station: -2 Presentation: Vertex Exam by:: Cathie Beams CNM   A&P: 22 y.o. [id number] [redacted]w[redacted]d here for eIOL.  #Labor: Continue pitocin. Plan to recheck cervix at 12am.  #Pain: Epidural #FWB: Cat I  #GBS negative  [name], DO 9:30 PM

## 2022-08-31 NOTE — H&P (Signed)
Victoria Garrett is a 22 y.o. female [id number] with IUP at [redacted]w[redacted]d presenting for IOL for elective. [facility]are at [facility]  Prenatal History/Complications:  Term SVD X2 ITP:  pt wants to proceed with IOL even if platelets are too low for an epidural.    Past Medical History: Past Medical History:  Diagnosis Date   Anemia    Anxiety    ASCUS with positive high risk HPV cervical 04/11/2021   Benign gestational thrombocytopenia in third trimester (HCC) 02/28/2020   Plts 135 at new OB, then 127 at 28 weeks Check platelets at 36 week visit [ ]     Deliberate self-cutting 03/20/2020   Depression    Generalized anxiety disorder with panic attacks 02/14/2020   Heart murmur    as a child    History of pyelonephritis during pregnancy 02/14/2020   Hydronephrosis, right 10/03/2015   Poor social situation 02/14/2020   Preterm uterine contractions in third trimester, antepartum 02/28/2020   Supervision of other normal pregnancy, antepartum 02/13/2020    Nursing Staff Provider Office Location [facility]  Dating   6w 5d [place] Language  ENGLISH Anatomy US   WNL visible in Care everywhere 10/2019 Flu Vaccine  02/14/2020 Genetic Screen  NIPS:   AFP:   First Screen:  Quad:   TDaP Vaccine   02/14/2020 Hgb A1C or  GTT Normal 1 hour 01/04/2020 per [facility] COVID Vaccine NOT VACCINATED   LAB RESULTS  Rhogam   Blood Type    B POS Feeding Plan BREASTFEED A   Thrombocytopenia (HCC)    UTI in pregnancy 03/02/2020   Vs bacteriuria. Treated 1/14. [ ]  TOC   Vaginal Pap smear, abnormal     Past Surgical History: Past Surgical History:  Procedure Laterality Date   IR NEPHROURETERAL CATH PLACE RIGHT Right 10/2015   NEPHROSTOMY TUBE PLACEMENT ([facility] HX)      Obstetrical History: OB History     Gravida  3   Para  2   Term  2   Preterm      AB      Living  2      SAB      IAB      Ectopic      Multiple  0   Live Births  2           Social History: Social History   Socioeconomic  History   Marital status: Single    Spouse name: Not on file   Number of children: 1   Years of education: Not on file   Highest education level: Not on file  Occupational History   Not on file  Tobacco Use   Smoking status: Never   Smokeless tobacco: Never  Vaping Use   Vaping status: Former   Substances: Flavoring  Substance and Sexual Activity   Alcohol use: Not Currently   Drug use: Not Currently    Types: Marijuana    Comment: last smoked 1st week July 2024   Sexual activity: Not Currently    Partners: Male    Birth control/protection: None  Other Topics Concern   Not on file  Social History Narrative   Not on file   Social Determinants of Health   Financial Resource Strain: High Risk (02/23/2022)   Received from [facility], [facility]   Overall Financial Resource Strain (CARDIA)    Difficulty of Paying Living Expenses: Hard  Food Insecurity: No Food Insecurity (08/31/2022)   Hunger Vital Sign    Worried  About Running Out of Food in the Last Year: Never true    Ran Out of Food in the Last Year: Never true  Transportation Needs: No Transportation Needs (08/31/2022)   PRAPARE - Administrator, Civil Service (Medical): No    Lack of Transportation (Non-Medical): No  Physical Activity: Not on file  Stress: Stress Concern Present (02/23/2022)   Received from [facility], [facility] - Occupational Stress Questionnaire    Feeling of Stress : Very much  Social Connections: Unknown (06/28/2021)   Received from [facility], [facility]   Social Network    Social Network: Not on file    Family History: Family History  Problem Relation Age of Onset   Healthy Mother    Healthy Father    Asthma Sister    Diabetes Maternal Grandmother     Allergies: No Known Allergies  Medications Prior to Admission  Medication Sig Dispense Refill Last Dose   cefadroxil (DURICEF) 500 MG capsule Take 1 capsule (500 mg  total) by mouth 2 (two) times daily. 14 capsule 0 08/30/2022   metroNIDAZOLE (FLAGYL) 500 MG tablet Take 1 tablet (500 mg total) by mouth 2 (two) times daily. 14 tablet 0 08/30/2022   ondansetron (ZOFRAN-ODT) 8 MG disintegrating tablet Take 1 tablet (8 mg total) by mouth every 6 (six) hours as needed for nausea. 20 tablet 1 Past Month   pantoprazole (PROTONIX) 20 MG tablet Take 1 tablet (20 mg total) by mouth daily. 30 tablet 0 Past Week   Prenatal Vit-Fe Fumarate-FA (PRENATAL MULTIVITAMIN) TABS tablet Take 1 tablet by mouth daily. 30 tablet 0 08/30/2022   terconazole (TERAZOL 7) 0.4 % vaginal cream Place 1 applicator vaginally at bedtime. 45 g 0         Review of Systems   Constitutional: Negative for fever and chills Eyes: Negative for visual disturbances Respiratory: Negative for shortness of breath, dyspnea Cardiovascular: Negative for chest pain or palpitations  Gastrointestinal: Negative for abdominal pain, vomiting, diarrhea and constipation.   Genitourinary: Negative for dysuria and urgency Musculoskeletal: Negative for back pain, joint pain, myalgias  Neurological: Negative for dizziness and headaches      Blood pressure 136/83, pulse (!) 113, temperature 98.9 F (37.2 C), temperature source Oral, resp. rate 16, height 5\' 2"  (1.575 m), weight 79.7 kg, last menstrual period 11/16/2021. General appearance: alert, cooperative, and no distress Lungs: normal respiratory effort Heart: regular rate and rhythm Abdomen: soft, non-tender; bowel sounds normal Extremities: Homans sign is negative, no sign of DVT DTR's 2+ Presentation: cephalic Fetal monitoring  Baseline: 145 bpm, Variability: Good {> 6 bpm), Accelerations: Reactive, and Decelerations: Absent Uterine activity  2-3 minutes, mild  Dilation: 3 Effacement (%): 50 Station: -2 Exam by:: F Cresenzo Dishmond, CNM   Prenatal labs: ABO, Rh: --/--/PENDING (07/14 1513) Antibody: PENDING (07/14 1513) Rubella: immune RPR:  Non Reactive (04/30 0844)  HBsAg: Negative (04/30 0844)  HIV: Non Reactive (04/30 0844)  GBS: Negative/-- (06/24 1117)   NURSING  PROVIDER  Office Location [facility] Dating by U/S at 7.1 wks  [facility] Model Traditional Anatomy U/S   Initiated care at  7 wks                Language  English              LAB RESULTS   Support Person  FOB Genetics NIPS:  AFP:     NT/IT (FT only)  Carrier Screen Horizon:   Rhogam  B/Positive/-- (04/30 8119) A1C/GTT Early:  Third trimester:   Flu Vaccine      TDaP Vaccine  06/17/22 Blood Type B/Positive/-- (04/30 0844)  Covid Vaccine  NO Antibody Negative (04/30 0844)    Rubella 1.78 (04/30 0844)  Feeding Plan breast RPR Non Reactive (04/30 0844)  Contraception Depo-Provera HBsAg Negative (04/30 0844)  Circumcision  unsure HIV Non Reactive (04/30 0844)  Pediatrician   [facility] HCVAb Non Reactive (04/30 1478)  Prenatal Classes       Pap Diagnosis  Date Value Ref Range Status  12/12/2020 (A)  Final   - Atypical squamous cells of undetermined significance (ASC-US)    BTLConsent  GC/CT Initial:   36wks:    VBAC  Consent  GBS  negative For PCN allergy, check sensitivities        DME Rx [ ]  BP cuff [ ]  Weight Scale Waterbirth  [ ]  Class [ ]  Consent [ ]  CNM visit  PHQ9 & GAD7 [  ] new OB [ x ] 28 weeks  Arly.Keller ] 36 weeks Induction  [ ]  Orders Entered [ ] Foley Y/N   Prenatal Transfer Tool  Maternal Diabetes: No Genetic Screening: Normal Maternal Ultrasounds/Referrals: Normal Fetal Ultrasounds or other Referrals:  None Maternal Substance Abuse:  No Significant Maternal Medications:  None Significant Maternal Lab Results: Group B Strep negative    Results for orders placed or performed during the hospital encounter of 08/31/22 (from the past 24 hour(s))  Type and screen [facility]   Collection Time: 08/31/22  3:13 PM  Result Value Ref Range   ABO/RH(D) PENDING    Antibody Screen PENDING    Sample Expiration       09/03/2022,2359 Performed at [facility], [address], [place], [place] [address]   Results for orders placed or performed during the hospital encounter of 08/30/22 (from the past 24 hour(s))  Urinalysis, Routine w reflex microscopic -Urine, Clean Catch   Collection Time: 08/30/22 10:48 PM  Result Value Ref Range   Color, Urine YELLOW YELLOW   APPearance HAZY (A) CLEAR   Specific Gravity, Urine 1.019 1.005 - 1.030   pH 6.0 5.0 - 8.0   Glucose, UA NEGATIVE NEGATIVE mg/dL   Hgb urine dipstick NEGATIVE NEGATIVE   Bilirubin Urine NEGATIVE NEGATIVE   Ketones, ur NEGATIVE NEGATIVE mg/dL   Protein, ur NEGATIVE NEGATIVE mg/dL   Nitrite NEGATIVE NEGATIVE   Leukocytes,Ua LARGE (A) NEGATIVE   RBC / HPF 0-5 0 - 5 RBC/hpf   WBC, UA 6-10 0 - 5 WBC/hpf   Bacteria, UA RARE (A) NONE SEEN   Squamous Epithelial / HPF 6-10 0 - 5 /HPF   Mucus PRESENT     Assessment: Victoria Garrett is a 22 y.o. [id number] with an IUP at [redacted]w[redacted]d presenting for IOL for elective.  Plan: #Labor: Attempted AROM but cx is posterior and became very uncomfortable for pt--will try later;  Start pitocin #Pain:  Per request #FWB Cat 1 #ID: GBS: neg   Jacklyn Shell 08/31/2022, 3:51 PM

## 2022-08-31 NOTE — Progress Notes (Signed)
Patient Vitals for the past 4 hrs:  BP Temp Temp src Pulse Resp SpO2  08/31/22 1931 (!) 118/56 -- -- 69 -- --  08/31/22 1918 -- 98.4 F (36.9 C) Oral -- 16 --  08/31/22 1900 116/61 -- -- 72 16 --  08/31/22 1830 (!) 110/51 -- -- 74 14 --  08/31/22 1800 117/64 -- -- 84 16 --  08/31/22 1743 109/60 98.8 F (37.1 C) Oral 78 14 --  08/31/22 1740 109/60 -- -- 78 16 --  08/31/22 1735 119/61 -- -- 66 16 --  08/31/22 1730 117/76 -- -- 78 16 98 %  08/31/22 1725 121/72 -- -- 71 14 98 %  08/31/22 1720 120/67 -- -- 73 18 98 %  08/31/22 1715 125/67 -- -- 76 16 99 %  08/31/22 1710 126/70 -- -- 76 14 99 %  08/31/22 1707 120/69 -- -- 74 16 99 %  08/31/22 1631 128/86 -- -- 84 16 --  08/31/22 1603 130/83 -- -- 80 14 --   FHR Cat 1.  MVUs ~ 180. Pitocin at 34mu/min.  Cx still about the same. WIll increase pitocin until MVUs >200

## 2022-08-31 NOTE — Anesthesia Procedure Notes (Signed)
Epidural Patient location during procedure: OB Start time: 08/31/2022 5:02 PM End time: 08/31/2022 5:07 PM  Staffing Anesthesiologist: Linton Rump, MD Performed: anesthesiologist   Preanesthetic Checklist Completed: patient identified, IV checked, site marked, risks and benefits discussed, surgical consent, monitors and equipment checked, pre-op evaluation and timeout performed  Epidural Patient position: sitting Prep: DuraPrep and site prepped and draped Patient monitoring: continuous pulse ox and blood pressure Approach: midline Location: L3-L4 Injection technique: LOR saline  Needle:  Needle type: Tuohy  Needle gauge: 17 G Needle length: 9 cm and 9 Needle insertion depth: 5 cm Catheter type: closed end flexible Catheter size: 19 Gauge Catheter at skin depth: 9 cm Test dose: negative  Assessment Events: blood not aspirated, no cerebrospinal fluid, injection not painful, no injection resistance, no paresthesia and negative IV test  Additional Notes The patient has requested an epidural for labor pain management. Risks and benefits including, but not limited to, infection, bleeding, local anesthetic toxicity, headache, hypotension, back pain, block failure, etc. were discussed with the patient. The patient expressed understanding and consented to the procedure. I confirmed that the patient has no bleeding disorders and is not taking blood thinners. I confirmed the patient's last platelet count with the nurse. A time-out was performed immediately prior to the procedure. Please see nursing documentation for vital signs. Sterile technique was used throughout the whole procedure. Once LOR achieved, the epidural catheter threaded easily without resistance. Aspiration of the catheter was negative for blood and CSF. The epidural was dosed slowly and an infusion was started.  1 attempt(s)Reason for block:procedure for pain

## 2022-08-31 NOTE — Anesthesia Preprocedure Evaluation (Signed)
Anesthesia Evaluation  Patient identified by MRN, date of birth, ID band Patient awake    Reviewed: Allergy & Precautions, NPO status , Patient's Chart, lab work & pertinent test results  History of Anesthesia Complications Negative for: history of anesthetic complications  Airway Mallampati: III  TM Distance: >3 FB Neck ROM: Full    Dental   Pulmonary neg pulmonary ROS   Pulmonary exam normal breath sounds clear to auscultation       Cardiovascular (-) hypertension+ Valvular Problems/Murmurs (as a child)  Rhythm:Regular Rate:Normal     Neuro/Psych  PSYCHIATRIC DISORDERS (h/o self-cutting, PTSD, h/o suicide attempt) Anxiety Depression       GI/Hepatic ,GERD  Medicated,,(+)     substance abuse  marijuana use  Endo/Other  negative endocrine ROS    Renal/GU Renal disease (h/o hydronephrosis)     Musculoskeletal   Abdominal   Peds  Hematology  (+) Blood dyscrasia (gestational thrombocytopenia), anemia Lab Results      Component                Value               Date                      WBC                      7.0                 08/31/2022                HGB                      11.0 (L)            08/31/2022                HCT                      33.9 (L)            08/31/2022                MCV                      76.2 (L)            08/31/2022                PLT                      120 (L)             08/31/2022              Anesthesia Other Findings   Reproductive/Obstetrics (+) Pregnancy                              Anesthesia Physical Anesthesia Plan  ASA: 2  Anesthesia Plan: Epidural   Post-op Pain Management:    Induction:   PONV Risk Score and Plan:   Airway Management Planned:   Additional Equipment:   Intra-op Plan:   Post-operative Plan:   Informed Consent: I have reviewed the patients History and Physical, chart, labs and discussed the procedure  including the risks, benefits and alternatives for the proposed anesthesia with the patient or authorized representative who has indicated his/her understanding  and acceptance.       Plan Discussed with: Anesthesiologist  Anesthesia Plan Comments: (I have discussed risks of neuraxial anesthesia including but not limited to infection, bleeding, nerve injury, back pain, headache, seizures, and failure of block. Patient denies bleeding disorders and is not currently anticoagulated. Labs have been reviewed. Risks and benefits discussed. All patient's questions answered.  )         Anesthesia Quick Evaluation

## 2022-09-01 ENCOUNTER — Encounter (HOSPITAL_COMMUNITY): Payer: Self-pay | Admitting: Obstetrics & Gynecology

## 2022-09-01 ENCOUNTER — Encounter: Payer: Medicaid Other | Admitting: Student

## 2022-09-01 DIAGNOSIS — O99214 Obesity complicating childbirth: Secondary | ICD-10-CM

## 2022-09-01 DIAGNOSIS — O99344 Other mental disorders complicating childbirth: Secondary | ICD-10-CM

## 2022-09-01 DIAGNOSIS — Z3A39 39 weeks gestation of pregnancy: Secondary | ICD-10-CM

## 2022-09-01 DIAGNOSIS — O9912 Other diseases of the blood and blood-forming organs and certain disorders involving the immune mechanism complicating childbirth: Secondary | ICD-10-CM

## 2022-09-01 DIAGNOSIS — O4423 Partial placenta previa NOS or without hemorrhage, third trimester: Secondary | ICD-10-CM

## 2022-09-01 DIAGNOSIS — O99324 Drug use complicating childbirth: Secondary | ICD-10-CM

## 2022-09-01 LAB — CBC
HCT: 30 % — ABNORMAL LOW (ref 36.0–46.0)
Hemoglobin: 9.5 g/dL — ABNORMAL LOW (ref 12.0–15.0)
MCH: 24.1 pg — ABNORMAL LOW (ref 26.0–34.0)
MCHC: 31.7 g/dL (ref 30.0–36.0)
MCV: 75.9 fL — ABNORMAL LOW (ref 80.0–100.0)
Platelets: 99 10*3/uL — ABNORMAL LOW (ref 150–400)
RBC: 3.95 MIL/uL (ref 3.87–5.11)
RDW: 15 % (ref 11.5–15.5)
WBC: 13.1 10*3/uL — ABNORMAL HIGH (ref 4.0–10.5)
nRBC: 0 % (ref 0.0–0.2)

## 2022-09-01 LAB — RPR: RPR Ser Ql: NONREACTIVE

## 2022-09-01 MED ORDER — ZOLPIDEM TARTRATE 5 MG PO TABS: 5.0000 mg | ORAL_TABLET | Freq: Every evening | ORAL | Status: DC | PRN

## 2022-09-01 MED ORDER — OXYCODONE HCL 5 MG PO TABS: 10.0000 mg | ORAL_TABLET | ORAL | Status: DC | PRN

## 2022-09-01 MED ORDER — DIBUCAINE (PERIANAL) 1 % EX OINT: 1.0000 | TOPICAL_OINTMENT | CUTANEOUS | Status: DC | PRN

## 2022-09-01 MED ORDER — ONDANSETRON HCL 4 MG/2ML IJ SOLN: 4.0000 mg | INTRAMUSCULAR | Status: DC | PRN

## 2022-09-01 MED ORDER — ACETAMINOPHEN 325 MG PO TABS
650.0000 mg | ORAL_TABLET | ORAL | Status: DC | PRN
  Administered 2022-09-01 (×2): 650 mg via ORAL
  Filled 2022-09-01 (×2): qty 2

## 2022-09-01 MED ORDER — SENNOSIDES-DOCUSATE SODIUM 8.6-50 MG PO TABS
2.0000 | ORAL_TABLET | Freq: Every day | ORAL | Status: DC
  Administered 2022-09-02: 2 via ORAL
  Filled 2022-09-01: qty 2

## 2022-09-01 MED ORDER — TETANUS-DIPHTH-ACELL PERTUSSIS 5-2.5-18.5 LF-MCG/0.5 IM SUSY: 0.5000 mL | PREFILLED_SYRINGE | Freq: Once | INTRAMUSCULAR | Status: DC

## 2022-09-01 MED ORDER — ONDANSETRON HCL 4 MG PO TABS: 4.0000 mg | ORAL_TABLET | ORAL | Status: DC | PRN

## 2022-09-01 MED ORDER — DIPHENHYDRAMINE HCL 25 MG PO CAPS: 25.0000 mg | ORAL_CAPSULE | Freq: Four times a day (QID) | ORAL | Status: DC | PRN

## 2022-09-01 MED ORDER — OXYCODONE HCL 5 MG PO TABS: 5.0000 mg | ORAL_TABLET | ORAL | Status: DC | PRN

## 2022-09-01 MED ORDER — COCONUT OIL OIL: 1.0000 | TOPICAL_OIL | Status: DC | PRN

## 2022-09-01 MED ORDER — BENZOCAINE-MENTHOL 20-0.5 % EX AERO
1.0000 | INHALATION_SPRAY | CUTANEOUS | Status: DC | PRN
  Administered 2022-09-01: 1 via TOPICAL
  Filled 2022-09-01: qty 56

## 2022-09-01 MED ORDER — IBUPROFEN 600 MG PO TABS
600.0000 mg | ORAL_TABLET | Freq: Four times a day (QID) | ORAL | Status: DC
  Administered 2022-09-01 – 2022-09-02 (×4): 600 mg via ORAL
  Filled 2022-09-01 (×5): qty 1

## 2022-09-01 MED ORDER — PRENATAL MULTIVITAMIN CH: 1.0000 | ORAL_TABLET | Freq: Every day | ORAL | Status: DC

## 2022-09-01 MED ORDER — SIMETHICONE 80 MG PO CHEW: 80.0000 mg | CHEWABLE_TABLET | ORAL | Status: DC | PRN

## 2022-09-01 MED ORDER — ACETAMINOPHEN 325 MG PO TABS: 650.0000 mg | ORAL_TABLET | ORAL | Status: DC | PRN

## 2022-09-01 MED ORDER — BENZOCAINE-MENTHOL 20-0.5 % EX AERO: 1.0000 | INHALATION_SPRAY | CUTANEOUS | Status: DC | PRN

## 2022-09-01 MED ORDER — PRENATAL MULTIVITAMIN CH
1.0000 | ORAL_TABLET | Freq: Every day | ORAL | Status: DC
  Filled 2022-09-01 (×2): qty 1

## 2022-09-01 MED ORDER — WITCH HAZEL-GLYCERIN EX PADS: 1.0000 | MEDICATED_PAD | CUTANEOUS | Status: DC | PRN

## 2022-09-01 MED ORDER — WITCH HAZEL-GLYCERIN EX PADS
1.0000 | MEDICATED_PAD | CUTANEOUS | Status: DC | PRN
  Administered 2022-09-01: 1 via TOPICAL

## 2022-09-01 MED ORDER — SENNOSIDES-DOCUSATE SODIUM 8.6-50 MG PO TABS: 2.0000 | ORAL_TABLET | Freq: Every day | ORAL | Status: DC

## 2022-09-01 MED ORDER — COCONUT OIL OIL
1.0000 | TOPICAL_OIL | Status: DC | PRN
  Administered 2022-09-01: 1 via TOPICAL

## 2022-09-01 MED ORDER — IBUPROFEN 600 MG PO TABS: 600.0000 mg | ORAL_TABLET | Freq: Four times a day (QID) | ORAL | Status: DC

## 2022-09-01 NOTE — Anesthesia Postprocedure Evaluation (Signed)
Anesthesia Post Note  Patient: Victoria Garrett  Procedure(s) Performed: AN AD HOC LABOR EPIDURAL     Patient location during evaluation: Mother Baby Anesthesia Type: Epidural Level of consciousness: awake, oriented and awake and alert Pain management: pain level controlled Vital Signs Assessment: post-procedure vital signs reviewed and stable Respiratory status: spontaneous breathing, respiratory function stable and nonlabored ventilation Cardiovascular status: stable Postop Assessment: no headache, adequate PO intake, able to ambulate, patient able to bend at knees and no apparent nausea or vomiting Anesthetic complications: no   No notable events documented.  Last Vitals:  Vitals:   09/01/22 0654 09/01/22 0958  BP: 123/76 121/73  Pulse: 63 75  Resp: 18 16  Temp: 36.9 C 36.7 C  SpO2: 100%     Last Pain:  Vitals:   09/01/22 1033  TempSrc:   PainSc: 1    Pain Goal: Patients Stated Pain Goal: 2 (09/01/22 0458)                 Truitt Leep

## 2022-09-01 NOTE — Progress Notes (Signed)
Called to room due to prolonged decel. Pitocin turned off. Fetal HR recovered to baseline. Allow fetal recovery and consider restarting pitocin.   Lavonda Jumbo, DO OB Fellow, [facility] 09/01/2022, 2:26 AM

## 2022-09-01 NOTE — Discharge Summary (Addendum)
Postpartum Discharge Summary  Date of Service updated***     Patient Name: Victoria Garrett DOB: [birth year] MRN: [id number]  Date of admission: 08/31/2022 Delivery date:09/01/2022 Delivering provider: Lavonda Jumbo Date of discharge: 09/01/2022  Admitting diagnosis: Indication for care in labor or delivery [O75.9] Intrauterine pregnancy: [redacted]w[redacted]d     Secondary diagnosis:  Active Problems:   Supervision of high risk pregnancy, antepartum   Marginal insertion of umbilical cord affecting management of mother in second trimester   Thrombocytopenia (HCC)  Additional problems: ***    Discharge diagnosis: {DX.:23714}                                              Post partum procedures:{Postpartum procedures:23558} Augmentation: Pitocin Complications: None  Hospital course: Induction of Labor With Vaginal Delivery   22 y.o. yo G3P2002 at [redacted]w[redacted]d was admitted to the hospital 08/31/2022 for induction of labor.  Indication for induction: Elective.  Patient had an labor course complicated by prolonged decel requiring discontinuation of pitocin prior to second stage of labor Membrane Rupture Time/Date: 4:40 PM,08/31/2022  Delivery Method:Vaginal, Spontaneous Episiotomy: None Lacerations:  None Details of delivery can be found in separate delivery note.  Patient had a postpartum course complicated by***. Patient is discharged home 09/01/22.  Newborn Data: Birth date:09/01/2022 Birth time:4:07 AM Gender:Female Living status:Living Apgars:9 ,9  Weight:   Magnesium Sulfate received: {Mag received:30440022} BMZ received: {BMZ received:30440023} Rhophylac:{Rhophylac received:30440032} NWG:{NFA:21308657} T-DaP:{Tdap:23962} Flu: {QIO:96295} Transfusion:{Transfusion received:30440034}  Physical exam  Vitals:   09/01/22 0216 09/01/22 0230 09/01/22 0301 09/01/22 0331  BP:  130/87 (!) 126/90 (!) 144/92  Pulse:  76 82 (!) 152  Resp:      Temp:  98.7 F (37.1 C)    TempSrc:  Oral     SpO2: 97%     Weight:      Height:       General: {Exam; general:21111117} Lochia: {Desc; appropriate/inappropriate:30686::"appropriate"} Uterine Fundus: {Desc; firm/soft:30687} Incision: {Exam; incision:21111123} DVT Evaluation: {Exam; dvt:2111122} Labs: Lab Results  Component Value Date   WBC 7.0 08/31/2022   HGB 11.0 (L) 08/31/2022   HCT 33.9 (L) 08/31/2022   MCV 76.2 (L) 08/31/2022   PLT 120 (L) 08/31/2022      Latest Ref Rng & Units 07/17/2022    8:39 PM  CMP  Glucose 70 - 99 mg/dL 86   BUN 6 - 20 mg/dL <5   Creatinine 2.84 - 1.00 mg/dL 1.32   Sodium 440 - 102 mmol/L 137   Potassium 3.5 - 5.1 mmol/L 3.2   Chloride 98 - 111 mmol/L 105   CO2 22 - 32 mmol/L 20   Calcium 8.9 - 10.3 mg/dL 9.3   Total Protein 6.5 - 8.1 g/dL 6.5   Total Bilirubin 0.3 - 1.2 mg/dL 0.6   Alkaline Phos 38 - 126 U/L 83   AST 15 - 41 U/L 17   ALT 0 - 44 U/L 24    Edinburgh Score:    04/26/2020    9:24 AM  Edinburgh Postnatal Depression Scale Screening Tool  I have been able to laugh and see the funny side of things. 0  I have looked forward with enjoyment to things. 0  I have blamed myself unnecessarily when things went wrong. 0  I have been anxious or worried for no good reason. 0  I have felt scared or panicky for no  good reason. 0  Things have been getting on top of me. 0  I have been so unhappy that I have had difficulty sleeping. 0  I have felt sad or miserable. 0  I have been so unhappy that I have been crying. 0  The thought of harming myself has occurred to me. 0  Edinburgh Postnatal Depression Scale Total 0     After visit meds:  Allergies as of 09/01/2022   No Known Allergies   Med Rec must be completed prior to using this [facility]***        Discharge home in stable condition Infant Feeding: {Baby feeding:23562} Infant Disposition:{CHL IP OB HOME WITH QIHKVQ:25956} Discharge instruction: per After Visit Summary and Postpartum booklet. Activity: Advance as  tolerated. Pelvic rest for 6 weeks.  Diet: {OB LOVF:64332951} Future Appointments: Future Appointments  Date Time Provider Department Center  09/01/2022  9:55 AM Corlis Hove, NP [facility] None  09/02/2022 10:00 AM Tobb, Lavona Mound, DO [facility] None  09/08/2022  8:55 AM Lennart Pall, MD [facility] None   Follow up Visit:  Message sent to [facility] by Autry-Lott on 09/01/2022  Please schedule this patient for a In person postpartum visit in 6 weeks with the following provider: Any provider. Additional Postpartum F/U:Postpartum Depression checkup and PP pap   High risk pregnancy complicated by:  thrombocytopenia, marginal cord insertion, major depression and intentional overdose Delivery mode:  Vaginal, Spontaneous Anticipated Birth Control:  Depo   09/01/2022 Randa Evens Autry-Lott, DO

## 2022-09-01 NOTE — Progress Notes (Signed)
Labor Progress Note Victoria Garrett is a 22 y.o. G3P2002 at [redacted]w[redacted]d presented for eIOL  S: No acute concerns.   O:  BP 133/77   Pulse 75   Temp 98.2 F (36.8 C) (Oral)   Resp 16   Ht 5\' 2"  (1.575 m)   Wt 79.7 kg   LMP 11/16/2021 (Exact Date)   SpO2 98%   BMI 32.12 kg/m  EFM: 130bpm/moderate/+accels, early decels  CVE: Dilation: 4.5 Effacement (%): 80 Cervical Position: Posterior Station: -1 Presentation: Vertex Exam by:: Dr. Salvadore Dom   A&P: 22 y.o. [id number] [redacted]w[redacted]d here for eIOL.  #Labor: Unchanged. IUPC in place. Continue to titrate up on pitocin.   #Pain: Epidural #FWB: Cat I  #GBS negative  Victoria Trent Autry-Lott, DO 12:44 AM

## 2022-09-01 NOTE — Lactation Note (Signed)
This note was copied from a baby's chart. Lactation Consultation Note  Patient Name: Victoria Garrett ZOXWR'U Date: 09/01/2022 Age:23 hours Reason for consult: Initial assessment;Term Experienced BF mom stated the baby latched well after delivery. Mom looks very tired. Mom stated she was very tired. Asked if she wanted LC to come back, mom stated no it's fine.  Mom had a few questions and LC answered them.  Newborn feeding habits, STS, I&O, body alignment, positioning, support, props, reviewed. Mom encouraged to feed baby 8-12 times/24 hours and with feeding cues.  Mom was an over producer w/her first child but not w/her 2nd one. Mom was relieved the baby has latched and BF so well. Mom has an over production of saliva during pregnancy. Mom is frequently spitting in a container. She is hoping that will end soon since she has delivered. Encouraged mom to BF every 3 hrs and w/cues. If needs assistance or has questions to call for assistance.   Maternal Data Has patient been taught Hand Expression?: Yes Does the patient have breastfeeding experience prior to this delivery?: Yes How long did the patient breastfeed?: 1st child  now 4 for 2 yrs, 2nd child now 2 for 8 months  Feeding    LATCH Score Latch: Grasps breast easily, tongue down, lips flanged, rhythmical sucking.  Audible Swallowing: A few with stimulation  Type of Nipple: Everted at rest and after stimulation  Comfort (Breast/Nipple): Soft / non-tender  Hold (Positioning): Assistance needed to correctly position infant at breast and maintain latch.  LATCH Score: 8   Lactation Tools Discussed/Used    Interventions Interventions: LC Services brochure;Breast feeding basics reviewed;Adjust position;Assisted with latch;Support pillows;Skin to skin;Position options;Education;Breast massage;Hand express;Breast compression  Discharge    Consult Status Consult Status: Follow-up Date: 09/02/22 Follow-up type:  In-patient    Charyl Dancer 09/01/2022, 6:29 AM

## 2022-09-02 ENCOUNTER — Ambulatory Visit: Payer: Medicaid Other | Admitting: Cardiology

## 2022-09-02 ENCOUNTER — Other Ambulatory Visit (HOSPITAL_COMMUNITY): Payer: Self-pay

## 2022-09-02 MED ORDER — ACETAMINOPHEN 325 MG PO TABS
650.0000 mg | ORAL_TABLET | ORAL | 0 refills | Status: DC | PRN
Start: 2022-09-02 — End: 2023-03-20
  Filled 2022-09-02: qty 100, 9d supply, fill #0

## 2022-09-02 MED ORDER — BENZOCAINE-MENTHOL 20-0.5 % EX AERO
1.0000 | INHALATION_SPRAY | CUTANEOUS | 0 refills | Status: DC | PRN
  Filled 2022-09-02: qty 78, fill #0

## 2022-09-02 MED ORDER — SERTRALINE HCL 50 MG PO TABS
50.0000 mg | ORAL_TABLET | Freq: Every day | ORAL | 2 refills | Status: DC
  Filled 2022-09-02: qty 30, 30d supply, fill #0

## 2022-09-02 MED ORDER — SENNOSIDES-DOCUSATE SODIUM 8.6-50 MG PO TABS
2.0000 | ORAL_TABLET | Freq: Every day | ORAL | 0 refills | Status: DC
  Filled 2022-09-02: qty 30, 15d supply, fill #0

## 2022-09-02 MED ORDER — CYCLOBENZAPRINE HCL 5 MG PO TABS
5.0000 mg | ORAL_TABLET | Freq: Three times a day (TID) | ORAL | 0 refills | Status: AC | PRN
  Filled 2022-09-02: qty 21, 7d supply, fill #0

## 2022-09-02 MED ORDER — IBUPROFEN 600 MG PO TABS
600.0000 mg | ORAL_TABLET | Freq: Four times a day (QID) | ORAL | 0 refills | Status: DC
  Filled 2022-09-02: qty 30, 8d supply, fill #0

## 2022-09-02 NOTE — Lactation Note (Signed)
This note was copied from a baby's chart. Lactation Consultation Note  Patient Name: Victoria Garrett ZOXWR'U Date: 09/02/2022 Age:23 hours Reason for consult: Follow-up assessment;Term;Infant weight loss (LC updated the doc flow sheets per mom and baby recently fed. LC reviewed BF D/C teaching and the resources. Mom declined a HP.)   Maternal Data    Feeding Mother's Current Feeding Choice: Breast Milk  LATCH Score 9's    Lactation Tools Discussed/Used Tools:  (mom declined a hand pump when offered)  Interventions Interventions: Assisted with latch;Education;LC Services brochure  Discharge Discharge Education: Engorgement and breast care;Warning signs for feeding baby Pump: Personal;DEBP  Consult Status Consult Status: Complete Date: 09/02/22    Kathrin Greathouse 09/02/2022, 4:10 PM

## 2022-09-02 NOTE — Clinical Social Work Maternal (Signed)
CLINICAL SOCIAL WORK MATERNAL/CHILD NOTE   Patient Details  Name: Victoria Garrett MRN: [id number] Date of Birth: 11/20/2000   Date:  April 07, 2022   Clinical Social Worker Initiating Note:  Willaim Rayas Kaira Stringfield      Date/Time: Initiated:  09/02/22/1453      Child's Name:  Festus Holts 11-Mar-2022    Biological Parents:  Mother, Father Victoria Garrett 02/05/2000, Victoria Garrett 02/23/1996)    Need for Interpreter:  None    Reason for Referral:  Behavioral Health Concerns, Current Substance Use/Substance Use During Pregnancy      Address:  [address] [place] [address]    Phone number:  [phone] (home)      Additional phone number:    Household Members/Support Persons (HM/SP):   Household Member/Support Person 1, Household Member/Support Person 2, Household Member/Support Person 3     HM/SP Name Relationship DOB or Age  HM/SP -1 Victoria Garrett FOB 02/23/1996  HM/SP -2 Angel daughter 02/05/2016  HM/SP -3 Adrianna daughter 03/27/2020  HM/SP -4     HM/SP -5     HM/SP -6     HM/SP -7     HM/SP -8         Natural Supports (not living in the home):       Professional Supports:      Employment: Unemployed    Type of Work:      Education:  Engineer, agricultural    Homebound arranged:     Surveyor, quantity Resources:  [employer]    Other Resources:  Sales executive      Cultural/Religious Considerations Which May Impact Care:     Strengths:  Ability to meet basic needs  , Home prepared for child  , Pediatrician chosen    Psychotropic Medications:          Pediatrician:    [facility] area   Pediatrician List:    [facility]  [place]   [place]   [facility]       Pediatrician Fax Number:     Risk Factors/Current Problems:  Mental Health Concerns      Cognitive State:  Able to Concentrate  , Alert      Mood/Affect:  Calm  , Comfortable      CSW  Assessment: CSW received consult for hx of SI and suicide attempt and THC use during pregnancy. CSW met with MOB to complete assessment and offer support. CSW entered the room and observed MOB caring for the infant. CSW introduced self, CSW role and reason for visit. Mob was agreeable to assessment and presented calm and engaged. CSW inquired about how MOB was feeling, MOB reported good. CSW inquired about MOB MH hx, MOB reported she has a hx of  anxiety, depression and panic attacks. CSW inquired about how MOB is addressing her MH concerns since her behavioral health hospitalization in January, MOB reported she was originally started antidepressants but stopped taking them and requested to restart her medication after she delivered. MOB reported her mood has been "okay" since the hospitalization. MOB reported she she was on the medication it was beneficial for her mood. MO B reports she also talks to her supports and tries to get out of the house to cope with symptoms.  CSW assessed for safety, MOB denied any current SI or HI. CSW inquired about how MOB feels taking the infant home, MOB reported I feel fine mentally.  MOB expressed interest in restarting her antidepressants prior to discharge. CSW offered to reach out to MOB's OB, MOB agreed. CSW provided education regarding the baby blues period vs. perinatal mood disorders, discussed treatment and gave resources for mental health follow up if concerns arise.  CSW recommends self-evaluation during the postpartum time period using the New Mom Checklist from Postpartum Progress and encouraged MOB to contact a medical professional if symptoms are noted at any time.  CSW identified FOB as her support.    CSW inquired about MOB THC use MOB reported her last use was at the beginning of her pregnancy. CSW informed MOB of the hospital drug screen policy, MOB verbalized understanding. CSW informed MOB infants UDS was negative and th CDS was pending but a CPS report would  be made if warranted, MOB verbalized understanding.   CSW provided review of Sudden Infant Death Syndrome (SIDS) precautions.  MOB reported he has all necessary items for the infant including a bassinet and a car seat.   CSW notified MOB's OB that MOB would like to start antidepressant prior to discharge.   CSW identifies no further need for intervention and no barriers to discharge at this time.   CSW Plan/Description:  No Further Intervention Required/No Barriers to Discharge, Sudden Infant Death Syndrome (SIDS) Education, Perinatal Mood and Anxiety Disorder (PMADs) Education, Hospital Drug Screen Policy Information, CSW Will Continue to Monitor Umbilical Cord Tissue Drug Screen Results and Make Report if Sandy Salaam, LCSW 08/11/2022, 3:04 PM

## 2022-09-08 ENCOUNTER — Encounter: Payer: Medicaid Other | Admitting: Obstetrics and Gynecology

## 2022-09-08 ENCOUNTER — Ambulatory Visit: Payer: Medicaid Other | Admitting: Licensed Clinical Social Worker

## 2022-09-08 DIAGNOSIS — Z91199 Patient's noncompliance with other medical treatment and regimen due to unspecified reason: Secondary | ICD-10-CM

## 2022-09-14 ENCOUNTER — Inpatient Hospital Stay (HOSPITAL_COMMUNITY): Payer: Medicaid Other

## 2022-09-15 NOTE — BH Specialist Note (Signed)
Pt no show appt  

## 2022-09-16 ENCOUNTER — Telehealth: Payer: Self-pay

## 2022-09-16 NOTE — Telephone Encounter (Signed)
Received a call from [employer]. Victoria Garrett states that patient scored a 11 on her PPD screening.   Called patient to further discuss sx. Patient was started on Zoloft at discharge from the hospital 09/03/22. Patient states that she has not taken the Zoloft due to having SI/HI, and feeling angry and irritated all the time. She also felt very over stimulated. She did not feel comfortable with restarting this medication due to past experience. Patient states that she has previously been prescribed Remeron and did well with that. Patient states that she will reach back out to her psychiatrist regarding restarting this medication. Can you recommend another medication until she is able to be seen by her psychiatrist. Patient is breastfeeding. PP visit is scheduled for 8/27  Patient missed virtual appointment last week, but would like it rescheduled. Message sent to front desk to reschedule.

## 2022-09-17 NOTE — Telephone Encounter (Signed)
Patient aware. Patient states that she has an appt with her psychiatrist tomorrow 8/1

## 2022-09-29 ENCOUNTER — Telehealth (HOSPITAL_COMMUNITY): Payer: Self-pay | Admitting: *Deleted

## 2022-09-29 NOTE — Telephone Encounter (Signed)
09/29/2022  Name: Victoria Garrett MRN: [id number] DOB: 05/02/2000  Reason for Call:  Transition of Care Hospital Discharge Call  Contact Status: Patient Contact Status: Complete  Language assistant needed: Interpreter Mode: Interpreter Not Needed        Follow-Up Questions: Do You Have Any Concerns About Your Health As You Heal From Delivery?: No Do You Have Any Concerns About Your Infants Health?: Yes What Concerns Do You Have About Your Baby?: She had questions about baby being sweaty at times despite having the air conditioning on in her home and having him in one layer of clothing.  When she has taken his temperature it has never been above 99.0.  His is eating well and otherwise has normal behavior.  She also asked if it is normal that one of his legs shakes sometimes. Shaking localized to the one leg.  Shaking stops if she touches his leg.  Advised that this could be in the range of normal due to newborns having immature nervous systems.  Also encouraged her to bring up both questions with pediatrician at next appointment on Oct 10, 2022.  Edinburgh Postnatal Depression Scale:  In the Past 7 Days: I have been able to laugh and see the funny side of things.: Definitely not so much now I have looked forward with enjoyment to things.: Rather less than I used to I have blamed myself unnecessarily when things went wrong.: Not very often I have been anxious or worried for no good reason.: Yes, very often I have felt scared or panicky for no good reason.: Yes, quite a lot Things have been getting on top of me.: Yes, sometimes I haven't been coping as well as usual I have been so unhappy that I have had difficulty sleeping.: Yes, sometimes I have felt sad or miserable.: Yes, quite often I have been so unhappy that I have been crying.: Only occasionally The thought of harming myself has occurred to me.: Never Inocente Salles Postnatal Depression Scale Total: (!) 17  Patient acknowledges  that her symptoms seem to be getting worse than when she was in the hospital.  Patient reports that she has not yet picked up antidepressant, but that she intends to.  Encouraged her to get it as soon as possible and begin taking as prescribed.  Advised that antidepressants can sometimes take up to one month to reach full effect.  She also says that she has a therapist and her next appointment is on Oct 02, 2022.  Encouraged her to keep this appointment.    PHQ2-9 Depression Scale:     Discharge Follow-up: Edinburgh score requires follow up?: Yes Provider notified of Edinburgh score?: Yes Have you already been referred for a counseling appointment?: Yes Date of appointment:: 10/02/22 Patient was advised of the following resources:: Breastfeeding Support Group, Support Group (Maternal Mental Health Resources) Did patient express any COVID concerns?: No  Post-discharge interventions: Reviewed Newborn Safe Sleep Practices  Salena Saner, RN 09/29/2022 14:39

## 2022-09-29 NOTE — Telephone Encounter (Signed)
Patient had already been referred to [facility], although she missed the appointment on September 08, 2022.  Notified Donata Duff, LCSW - IBH specialist, and Dr. Alysia Penna via In-Box message.

## 2022-10-14 ENCOUNTER — Ambulatory Visit: Payer: Medicaid Other | Admitting: Advanced Practice Midwife

## 2022-10-14 ENCOUNTER — Ambulatory Visit: Payer: Medicaid Other | Admitting: Licensed Clinical Social Worker

## 2022-10-14 DIAGNOSIS — F39 Unspecified mood [affective] disorder: Secondary | ICD-10-CM | POA: Diagnosis not present

## 2022-10-17 NOTE — BH Specialist Note (Signed)
Integrated Behavioral Health via Telemedicine Visit  10/17/2022 Alexa Favela [id number]  Number of Integrated Behavioral Health Clinician visits: 3 Session Start time:  2:00pm Session End time: 2:54pm Total time in minutes: 54 mins via phone per pt request feeding newborn   Referring Provider: Inis Sizer RN Patient/Family location: home  [facility] Provider location: [facility]  All persons participating in visit: Pt S Jarquin and LCSW A Brylyn Novakovich  Types of Service: Individual psychotherapy and Telephone visit  I connected with [name] and/or Marnita Lorenzo-Jarquin's n/a via  Telephone or Engineer, civil (consulting)  (Video is Surveyor, mining) and verified that I am speaking with the correct person using two identifiers. Discussed confidentiality: Yes   I discussed the limitations of telemedicine and the availability of in person appointments.  Discussed there is a possibility of technology failure and discussed alternative modes of communication if that failure occurs.  I discussed that engaging in this telemedicine visit, they consent to the provision of behavioral healthcare and the services will be billed under their insurance.  Patient and/or legal guardian expressed understanding and consented to Telemedicine visit: Yes   Presenting Concerns: Patient and/or family reports the following symptoms/concerns: mood disorder Duration of problem: over one year; Severity of problem: mild  Patient and/or Family's Strengths/Protective Factors: Concrete supports in place (healthy food, safe environments, etc.)  Goals Addressed: Patient will:  Reduce symptoms of: depression  Increase knowledge and/or ability of: coping skills   Demonstrate ability to: Increase healthy adjustment to current life circumstances  Progress towards Goals: Ongoing  Interventions: Interventions utilized:  Supportive Counseling Standardized Assessments completed: Not  Needed  Patient and/or Family Response: Ms. Leonor Liv reports she is bonding well with newborn and does not have any emotional and/or social concerns. Ms. Leonor Liv reports family is supportive. Ms. Leonor Liv is concerning going to back to school or looking for employment.   Assessment: Patient currently experiencing depressed mood, history of mood disorder and history of overdose.   Patient may benefit from integrated behavioral health.  Plan: Follow up with behavioral health clinician on : postpartum visit Behavioral recommendations:  prioritize rest, schedule time for self care, decrease social isolation by taking walks, join a support group, keep medical appts, and set smart goals  Referral(s): Integrated [employer] (In Clinic)  I discussed the assessment and treatment plan with the patient and/or parent/guardian. They were provided an opportunity to ask questions and all were answered. They agreed with the plan and demonstrated an understanding of the instructions.   They were advised to call back or seek an in-person evaluation if the symptoms worsen or if the condition fails to improve as anticipated.  Gwyndolyn Saxon, LCSW

## 2022-10-28 ENCOUNTER — Ambulatory Visit: Payer: Medicaid Other | Admitting: Licensed Clinical Social Worker

## 2022-10-28 ENCOUNTER — Ambulatory Visit: Payer: Medicaid Other | Admitting: Advanced Practice Midwife

## 2022-12-01 ENCOUNTER — Ambulatory Visit (INDEPENDENT_AMBULATORY_CARE_PROVIDER_SITE_OTHER): Payer: Medicaid Other | Admitting: Advanced Practice Midwife

## 2022-12-01 ENCOUNTER — Other Ambulatory Visit (HOSPITAL_COMMUNITY)
Admission: RE | Admit: 2022-12-01 | Discharge: 2022-12-01 | Disposition: A | Discharge status: Home / Self Care | Payer: Medicaid Other | Source: Ambulatory Visit | Attending: Advanced Practice Midwife | Admitting: Advanced Practice Midwife

## 2022-12-01 ENCOUNTER — Encounter: Payer: Self-pay | Admitting: Advanced Practice Midwife

## 2022-12-01 VITALS — BP 130/81 | HR 81 | Ht 62.0 in | Wt 153.2 lb

## 2022-12-01 DIAGNOSIS — Z113 Encounter for screening for infections with a predominantly sexual mode of transmission: Secondary | ICD-10-CM | POA: Insufficient documentation

## 2022-12-01 DIAGNOSIS — Z3202 Encounter for pregnancy test, result negative: Secondary | ICD-10-CM | POA: Diagnosis not present

## 2022-12-01 DIAGNOSIS — R0602 Shortness of breath: Secondary | ICD-10-CM

## 2022-12-01 DIAGNOSIS — R5382 Chronic fatigue, unspecified: Secondary | ICD-10-CM | POA: Diagnosis not present

## 2022-12-01 DIAGNOSIS — L989 Disorder of the skin and subcutaneous tissue, unspecified: Secondary | ICD-10-CM

## 2022-12-01 DIAGNOSIS — Z30013 Encounter for initial prescription of injectable contraceptive: Secondary | ICD-10-CM | POA: Diagnosis not present

## 2022-12-01 DIAGNOSIS — Z124 Encounter for screening for malignant neoplasm of cervix: Secondary | ICD-10-CM

## 2022-12-01 DIAGNOSIS — Z3009 Encounter for other general counseling and advice on contraception: Secondary | ICD-10-CM | POA: Diagnosis not present

## 2022-12-01 LAB — POCT URINE PREGNANCY: Preg Test, Ur: NEGATIVE

## 2022-12-01 MED ORDER — MEDROXYPROGESTERONE ACETATE 150 MG/ML IM SUSP
150.0000 mg | Freq: Once | INTRAMUSCULAR | Status: AC
Start: 2022-12-01 — End: 2022-12-01
  Administered 2022-12-01: 150 mg via INTRAMUSCULAR

## 2022-12-01 MED ORDER — MEDROXYPROGESTERONE ACETATE 150 MG/ML IM SUSP: 150.0000 mg | INTRAMUSCULAR | 0 refills | Status: DC

## 2022-12-01 NOTE — Progress Notes (Signed)
Post Partum Visit Note  Victoria Garrett is a 22 y.o. [id number] female who presents for a postpartum visit. She is 13 weeks postpartum following a normal spontaneous vaginal delivery.  I have fully reviewed the prenatal and intrapartum course. The delivery was at 39 gestational weeks.  Anesthesia: epidural. Postpartum course has been challenging. Baby is doing well. Baby is feeding by both breast and bottle - Similac with Iron. Bleeding no bleeding. Bowel function is normal. Bladder function is normal. Patient is sexually active. Contraception method is Depo-Provera injections. Postpartum depression screening: negative.   The pregnancy intention screening data noted above was reviewed. Potential methods of contraception were discussed. The patient elected to proceed with No data recorded.   Edinburgh Postnatal Depression Scale - 12/01/22 1517       Edinburgh Postnatal Depression Scale:  In the Past 7 Days   I have been able to laugh and see the funny side of things. 1    I have looked forward with enjoyment to things. 1    I have blamed myself unnecessarily when things went wrong. 1    I have been anxious or worried for no good reason. 3    I have felt scared or panicky for no good reason. 2    Things have been getting on top of me. 1    I have been so unhappy that I have had difficulty sleeping. 0    I have felt sad or miserable. 0    I have been so unhappy that I have been crying. 0    The thought of harming myself has occurred to me. 0    Edinburgh Postnatal Depression Scale Total 9             Health Maintenance Due  Topic Date Due   HPV VACCINES (1 - 3-dose series) Never done   INFLUENZA VACCINE  09/18/2022   COVID-19 Vaccine (1 - 2023-24 season) Never done    The following portions of the patient's history were reviewed and updated as appropriate: allergies, current medications, past family history, past medical history, past social history, past surgical history,  and problem list.  Review of Systems Pertinent items noted in HPI and remainder of comprehensive ROS otherwise negative.  Objective:  BP 130/81   Pulse 81   Ht 5\' 2"  (1.575 m)   Wt 153 lb 3.2 oz (69.5 kg)   Breastfeeding Yes   BMI 28.02 kg/m    VS reviewed, nursing note reviewed,  Constitutional: well developed, well nourished, no distress HEENT: normocephalic CV: normal rate Pulm/chest wall: normal effort Abdomen: soft Neuro: alert and oriented x 3 Skin: warm, dry Psych: affect normal   Plan:  1. Screening for cervical cancer  - HIV antibody (with reflex) - RPR - Hepatitis B Surface AntiGEN - Hepatitis C Antibody  2. Screening examination for STI  - Cytology - PAP( [place]) - Cervicovaginal ancillary only( [place])  3. Encounter for counseling regarding contraception  - POCT urine pregnancy  4. Chronic fatigue  - CBC - Comp Met (CMET) - TSH - AMB Referral to Cardio Obstetrics  5. Skin lesion of hand --Pt believes these are associated with fatigue and low platelets --Petichiae vs other skin lesion  - Ambulatory referral to Dermatology  6. Shortness of breath  - AMB Referral to Cardio Obstetrics  Essential components of care per ACOG recommendations:  1.  Mood and well being: Patient with negative depression screening today. Reviewed local resources for support.  -  Patient tobacco use? No.   - hx of drug use? No.    2. Infant care and feeding:  -Patient currently breastmilk feeding? Yes. Reviewed importance of draining breast regularly to support lactation.  -Social determinants of health (SDOH) reviewed in EPIC. No concerns   3. Sexuality, contraception and birth spacing - Patient does not want a pregnancy in the next year.   - Reviewed reproductive life planning. Reviewed contraceptive methods based on pt preferences and effectiveness.  Patient desired Hormonal Injection today.   - Discussed birth spacing of 18 months  4. Sleep and  fatigue -Encouraged family/partner/community support of 4 hrs of uninterrupted sleep to help with mood and fatigue  5. Physical Recovery  - Discussed patients delivery and complications. She describes her labor as good. - Patient had a Vaginal, no problems at delivery. Patient had no laceration .Perineal healing reviewed. Patient expressed understanding - Patient has urinary incontinence? No. - Patient is safe to resume physical and sexual activity  6.  Health Maintenance - HM due items addressed Yes - Last pap smear  Diagnosis  Date Value Ref Range Status  12/12/2020 (A)  Final   - Atypical squamous cells of undetermined significance (ASC-US)   Pap smear done at today's visit.  Yes -Breast Cancer screening indicated? No.   7. Chronic Disease/Pregnancy Condition follow up:  Thrombocytopenia, fatigue.  - PCP follow up --Refer to [facility] cardiology (pt referred in pregnancy but delivered before appt) --Refer to dermatology  Sharen Counter, CNM [facility], [facility]

## 2022-12-02 LAB — COMPREHENSIVE METABOLIC PANEL
ALT: 21 [IU]/L (ref 0–32)
AST: 15 [IU]/L (ref 0–40)
Albumin: 4.9 g/dL (ref 4.0–5.0)
Alkaline Phosphatase: 87 [IU]/L (ref 44–121)
BUN/Creatinine Ratio: 22 (ref 9–23)
BUN: 13 mg/dL (ref 6–20)
Bilirubin Total: <0.2 mg/dL (ref 0.0–1.2)
CO2: 22 mmol/L (ref 20–29)
Calcium: 9.7 mg/dL (ref 8.7–10.2)
Chloride: 103 mmol/L (ref 96–106)
Creatinine, Ser: 0.6 mg/dL (ref 0.57–1.00)
Globulin, Total: 2.8 g/dL (ref 1.5–4.5)
Glucose: 89 mg/dL (ref 70–99)
Potassium: 3.7 mmol/L (ref 3.5–5.2)
Sodium: 141 mmol/L (ref 134–144)
Total Protein: 7.7 g/dL (ref 6.0–8.5)
eGFR: 129 mL/min/{1.73_m2} (ref >59)

## 2022-12-02 LAB — CBC
Hematocrit: 42.3 % (ref 34.0–46.6)
Hemoglobin: 14 g/dL (ref 11.1–15.9)
MCH: 26.2 pg — ABNORMAL LOW (ref 26.6–33.0)
MCHC: 33.1 g/dL (ref 31.5–35.7)
MCV: 79 fL (ref 79–97)
Platelets: 150 10*3/uL (ref 150–450)
RBC: 5.34 x10E6/uL — ABNORMAL HIGH (ref 3.77–5.28)
RDW: 16 % — ABNORMAL HIGH (ref 11.7–15.4)
WBC: 6.6 10*3/uL (ref 3.4–10.8)

## 2022-12-02 LAB — HIV ANTIBODY (ROUTINE TESTING W REFLEX): HIV Screen 4th Generation wRfx: NONREACTIVE

## 2022-12-02 LAB — HEPATITIS B SURFACE ANTIGEN: Hepatitis B Surface Ag: NEGATIVE

## 2022-12-02 LAB — RPR: RPR Ser Ql: NONREACTIVE

## 2022-12-02 LAB — TSH: TSH: 1.65 u[IU]/mL (ref 0.450–4.500)

## 2022-12-02 LAB — HEPATITIS C ANTIBODY: Hep C Virus Ab: NONREACTIVE

## 2022-12-03 LAB — CERVICOVAGINAL ANCILLARY ONLY
Chlamydia: NEGATIVE
Comment: NEGATIVE
Comment: NEGATIVE
Comment: NORMAL
Neisseria Gonorrhea: NEGATIVE
Trichomonas: NEGATIVE

## 2022-12-05 LAB — CYTOLOGY - PAP
Comment: NEGATIVE
Diagnosis: UNDETERMINED — AB
High risk HPV: NEGATIVE

## 2022-12-17 ENCOUNTER — Ambulatory Visit: Payer: Medicaid Other | Admitting: Cardiology

## 2023-01-04 ENCOUNTER — Encounter: Payer: Self-pay | Admitting: *Deleted

## 2023-01-04 ENCOUNTER — Ambulatory Visit
Admission: EM | Admit: 2023-01-04 | Discharge: 2023-01-04 | Disposition: A | Discharge status: Home / Self Care | Payer: Medicaid Other | Attending: Internal Medicine | Admitting: Internal Medicine

## 2023-01-04 ENCOUNTER — Other Ambulatory Visit: Payer: Self-pay

## 2023-01-04 ENCOUNTER — Ambulatory Visit: Payer: Medicaid Other

## 2023-01-04 DIAGNOSIS — R197 Diarrhea, unspecified: Secondary | ICD-10-CM | POA: Insufficient documentation

## 2023-01-04 DIAGNOSIS — B349 Viral infection, unspecified: Secondary | ICD-10-CM | POA: Diagnosis not present

## 2023-01-04 DIAGNOSIS — R042 Hemoptysis: Secondary | ICD-10-CM | POA: Diagnosis not present

## 2023-01-04 DIAGNOSIS — R112 Nausea with vomiting, unspecified: Secondary | ICD-10-CM | POA: Diagnosis present

## 2023-01-04 DIAGNOSIS — R051 Acute cough: Secondary | ICD-10-CM

## 2023-01-04 DIAGNOSIS — J189 Pneumonia, unspecified organism: Secondary | ICD-10-CM | POA: Diagnosis present

## 2023-01-04 MED ORDER — AMOXICILLIN-POT CLAVULANATE 875-125 MG PO TABS: 1.0000 | ORAL_TABLET | Freq: Two times a day (BID) | ORAL | 0 refills | Status: DC

## 2023-01-04 MED ORDER — DEXTROMETHORPHAN HBR 15 MG/5ML PO SYRP: 10.0000 mL | ORAL_SOLUTION | Freq: Four times a day (QID) | ORAL | 0 refills | Status: DC | PRN

## 2023-01-04 MED ORDER — ONDANSETRON 4 MG PO TBDP: 4.0000 mg | ORAL_TABLET | Freq: Three times a day (TID) | ORAL | 0 refills | Status: DC | PRN

## 2023-01-04 MED ORDER — DOXYCYCLINE HYCLATE 100 MG PO CAPS: 100.0000 mg | ORAL_CAPSULE | Freq: Two times a day (BID) | ORAL | 0 refills | Status: AC

## 2023-01-04 NOTE — ED Triage Notes (Signed)
Pt reports productive cough x 2-3 days. States she has seen mucous mixed with streaks of blood at time. Reports fatigue. Denies fever. Endorses "Chills and sweats". NAD.   Also reports she ate at [facility] [facility] about 10 days ago and felt ill afterwards (nausea and diarrhea) still endorses nausea.

## 2023-01-04 NOTE — ED Provider Notes (Addendum)
[facility]    CSN: [id number] Arrival date & time: 01/04/23  1045      History   Chief Complaint Chief Complaint  Patient presents with   Cough    HPI Victoria Garrett is a 22 y.o. female.   Patient presents with approximately 2 to 3-day history of productive cough with blood-tinged sputum.  Denies nasal congestion, runny nose, fever.  Children have had similar symptoms.  She has also been very fatigued.  She has taken TheraFlu for symptoms.  Denies history of asthma.  Denies chest pain or shortness of breath.  Patient also reporting some persistent nausea, vomiting, diarrhea that started about 10 days ago after she ate at [employer].  Symptoms started subsequently after eating a chicken sandwich there.  Denies blood in stool or emesis.  She has been able to tolerate food and fluids.   Cough   Past Medical History:  Diagnosis Date   Anemia    Anxiety    ASCUS with positive high risk HPV cervical 04/11/2021   Benign gestational thrombocytopenia in third trimester (HCC) 02/28/2020   Plts 135 at new OB, then 127 at 28 weeks Check platelets at 36 week visit [ ]     Deliberate self-cutting 03/20/2020   Depression    Generalized anxiety disorder with panic attacks 02/14/2020   Heart murmur    as a child    History of pyelonephritis during pregnancy 02/14/2020   Hydronephrosis, right 10/03/2015   Poor social situation 02/14/2020   Preterm uterine contractions in third trimester, antepartum 02/28/2020   Supervision of other normal pregnancy, antepartum 02/13/2020    Nursing Staff Provider Office Location [facility] XFER  Dating   6w 5d [place] Language  ENGLISH Anatomy US   WNL visible in Care everywhere 10/2019 Flu Vaccine  02/14/2020 Genetic Screen  NIPS:   AFP:   First Screen:  Quad:   TDaP Vaccine   02/14/2020 Hgb A1C or  GTT Normal 1 hour 01/04/2020 per [facility] COVID Vaccine NOT VACCINATED   LAB RESULTS  Rhogam   Blood Type    B POS Feeding Plan BREASTFEED A    Thrombocytopenia (HCC)    UTI in pregnancy 03/02/2020   Vs bacteriuria. Treated 1/14. [ ]  TOC   Vaginal Pap smear, abnormal     Patient Active Problem List   Diagnosis Date Noted   Suicide attempt by drug overdose (HCC) 04/18/2022   Severe recurrent major depression without psychotic features (HCC) 03/03/2022   Moderate cannabis use disorder (HCC) 02/23/2022   Thrombocytopenia (HCC) 02/23/2022   PTSD (post-traumatic stress disorder) 03/03/2018    Past Surgical History:  Procedure Laterality Date   IR NEPHROURETERAL CATH PLACE RIGHT Right 10/2015   NEPHROSTOMY TUBE PLACEMENT ([facility] HX)      OB History     Gravida  3   Para  3   Term  3   Preterm      AB      Living  3      SAB      IAB      Ectopic      Multiple  0   Live Births  3            Home Medications    Prior to Admission medications   Medication Sig Start Date End Date Taking? Authorizing Provider  amoxicillin-clavulanate (AUGMENTIN) 875-125 MG tablet Take 1 tablet by mouth every 12 (twelve) hours. 01/04/23  Yes Gustavus Bryant, FNP  dextromethorphan 15 MG/5ML  syrup Take 10 mLs (30 mg total) by mouth every 6 (six) hours as needed for cough. 01/04/23  Yes Jaquane Boughner, Rolly Salter E, FNP  doxycycline (VIBRAMYCIN) 100 MG capsule Take 1 capsule (100 mg total) by mouth 2 (two) times daily for 7 days. 01/04/23 01/11/23 Yes Keric Zehren, Acie Fredrickson, FNP  medroxyPROGESTERone (DEPO-PROVERA) 150 MG/ML injection Inject 1 mL (150 mg total) into the muscle every 3 (three) months. 12/01/22  Yes Leftwich-Kirby, Wilmer Floor, CNM  ondansetron (ZOFRAN-ODT) 4 MG disintegrating tablet Take 1 tablet (4 mg total) by mouth every 8 (eight) hours as needed for nausea or vomiting. 01/04/23  Yes Iktan Aikman, Acie Fredrickson, FNP  acetaminophen (TYLENOL) 325 MG tablet Take 2 tablets (650 mg total) by mouth every 4 (four) hours as needed (for pain scale < 4). Patient not taking: Reported on 12/01/2022 09/02/22   Mercado-Ortiz, Lahoma Crocker, DO  benzocaine-Menthol  (DERMOPLAST) 20-0.5 % AERO Apply 1 Application topically as needed for irritation (perineal discomfort). Patient not taking: Reported on 12/01/2022 09/02/22   Mercado-Ortiz, Lahoma Crocker, DO  ibuprofen (ADVIL) 600 MG tablet Take 1 tablet (600 mg total) by mouth every 6 (six) hours. Patient not taking: Reported on 12/01/2022 09/02/22   Mercado-Ortiz, Lahoma Crocker, DO  senna-docusate (SENOKOT-S) 8.6-50 MG tablet Take 2 tablets by mouth daily. Patient not taking: Reported on 12/01/2022 09/03/22   Mercado-Ortiz, Lahoma Crocker, DO  sertraline (ZOLOFT) 50 MG tablet Take 1 tablet (50 mg total) by mouth daily. Patient not taking: Reported on 12/01/2022 09/02/22 09/02/23  Mercado-Ortiz, Lahoma Crocker, DO    Family History Family History  Problem Relation Age of Onset   Healthy Mother    Healthy Father    Asthma Sister    Diabetes Maternal Grandmother     Social History Social History   Tobacco Use   Smoking status: Never   Smokeless tobacco: Never  Vaping Use   Vaping status: Former   Substances: Flavoring  Substance Use Topics   Alcohol use: Not Currently   Drug use: Not Currently    Types: Marijuana    Comment: last smoked 1st week July 2024     Allergies   Patient has no known allergies.   Review of Systems Review of Systems Per HPI  Physical Exam Triage Vital Signs ED Triage Vitals  Encounter Vitals Group     BP 01/04/23 1301 109/62     Systolic BP Percentile --      Diastolic BP Percentile --      Pulse Rate 01/04/23 1301 70     Resp 01/04/23 1301 18     Temp 01/04/23 1301 98.2 F (36.8 C)     Temp Source 01/04/23 1301 Oral     SpO2 01/04/23 1301 97 %     Weight --      Height --      Head Circumference --      Peak Flow --      Pain Score 01/04/23 1407 0     Pain Loc --      Pain Education --      Exclude from Growth Chart --    No data found.  Updated Vital Signs BP 109/62 (BP Location: Right Arm)   Pulse 70   Temp 98.2 F (36.8 C) (Oral)   Resp 18   SpO2 97%    Breastfeeding Yes   Visual Acuity Right Eye Distance:   Left Eye Distance:   Bilateral Distance:    Right Eye Near:   Left Eye Near:  Bilateral Near:     Physical Exam Constitutional:      General: She is not in acute distress.    Appearance: Normal appearance. She is not toxic-appearing or diaphoretic.  HENT:     Head: Normocephalic and atraumatic.     Right Ear: Tympanic membrane and ear canal normal.     Left Ear: Tympanic membrane and ear canal normal.     Nose: Congestion present.     Mouth/Throat:     Mouth: Mucous membranes are moist.     Pharynx: No posterior oropharyngeal erythema.  Eyes:     Extraocular Movements: Extraocular movements intact.     Conjunctiva/sclera: Conjunctivae normal.     Pupils: Pupils are equal, round, and reactive to light.  Cardiovascular:     Rate and Rhythm: Normal rate and regular rhythm.     Pulses: Normal pulses.     Heart sounds: Normal heart sounds.  Pulmonary:     Effort: Pulmonary effort is normal. No respiratory distress.     Breath sounds: Normal breath sounds. No stridor. No wheezing, rhonchi or rales.  Abdominal:     General: Abdomen is flat. Bowel sounds are normal. There is no distension.     Palpations: Abdomen is soft.     Tenderness: There is no abdominal tenderness.  Musculoskeletal:        General: Normal range of motion.     Cervical back: Normal range of motion.  Skin:    General: Skin is warm and dry.  Neurological:     General: No focal deficit present.     Mental Status: She is alert and oriented to person, place, and time. Mental status is at baseline.  Psychiatric:        Mood and Affect: Mood normal.        Behavior: Behavior normal.      UC Treatments / Results  Labs (all labs ordered are listed, but only abnormal results are displayed) Labs Reviewed  SARS CORONAVIRUS 2 (TAT 6-24 HRS)  CBC  COMPREHENSIVE METABOLIC PANEL    EKG   Radiology DG Chest 2 View  Result Date:  01/04/2023 CLINICAL DATA:  Hemoptysis. EXAM: CHEST - 2 VIEW COMPARISON:  Jul 17, 2022 FINDINGS: Cardiomediastinal silhouette is normal. Mediastinal contours appear intact. There is a focal airspace consolidation in the lingula. No pleural effusion or pneumothorax. Osseous structures are without acute abnormality. Soft tissues are grossly normal. IMPRESSION: Focal airspace consolidation in the lingula. In the correct clinical setting this likely represents pneumonia. Electronically Signed   By: Ted Mcalpine M.D.   On: 01/04/2023 15:08    Procedures Procedures (including critical care time)  Medications Ordered in UC Medications - No data to display  Initial Impression / Assessment and Plan / UC Course  I have reviewed the triage vital signs and the nursing notes.  Pertinent labs & imaging results that were available during my care of the patient were reviewed by me and considered in my medical decision making (see chart for details).     1.  Community-acquired pneumonia  X-ray is showing concerns for pneumonia.  Patient's vital signs are stable and she is not short of breath so I do think outpatient treatment is reasonable.  Will treat with Augmentin and doxycycline.  Patient states that she is breast-feeding but is able to pump and dump and use formula while taking medications.  Encouraged her to pump and dump while taking these medications.  Advised to return in 4 to 6  weeks to have repeat chest x-ray to ensure infection is clear with family medicine or urgent care.  Cough medications prescribed for coughing as this can be safe with breast-feeding but still encouraged to pump and dump.  COVID test also pending.  2.  Nausea, vomiting, diarrhea  Suspect food related illness versus viral illness.  Given symptoms have been present for over a week, will obtain CMP, CBC, stool sample.  No signs of dehydration or acute abdomen on exam that would warrant emergent evaluation.  Patient prescribed  ondansetron to take as needed for nausea but again reiterated to pump and dump as this is not typically recommended in breast-feeding.  Advised adequate fluids, rest, bland diet.  Advised strict ER precautions if symptoms persist or worsen. Patient was not able to provide stool sample in urgent care today so she was sent home with a stool sample kit. Patient verbalized understanding and was agreeable with plan. Final Clinical Impressions(s) / UC Diagnoses   Final diagnoses:  Nausea vomiting and diarrhea  Viral illness  Acute cough     Discharge Instructions      Blood work and COVID test are pending.  I will call if it is abnormal.  It appears that you have pneumonia so I have prescribed 2 antibiotics to take to treat this.  I also prescribed you a nausea medication and a cough medication.  Please try to avoid breast-feeding if you can while taking these medications.  Follow-up in 4 to 6 weeks with family medicine doctor to have repeat chest x-ray to ensure infection is clear.  Please bring back stool sample for testing.    ED Prescriptions     Medication Sig Dispense Auth. Provider   ondansetron (ZOFRAN-ODT) 4 MG disintegrating tablet Take 1 tablet (4 mg total) by mouth every 8 (eight) hours as needed for nausea or vomiting. 20 tablet [place], Preston E, [place]   dextromethorphan 15 MG/5ML syrup Take 10 mLs (30 mg total) by mouth every 6 (six) hours as needed for cough. 120 mL Ervin Knack E, [place]   amoxicillin-clavulanate (AUGMENTIN) 875-125 MG tablet Take 1 tablet by mouth every 12 (twelve) hours. 14 tablet [place], Inez E, [place]   doxycycline (VIBRAMYCIN) 100 MG capsule Take 1 capsule (100 mg total) by mouth 2 (two) times daily for 7 days. 14 capsule [place], Acie Fredrickson, [place]      PDMP not reviewed this encounter.   Gustavus Bryant, [place] 01/04/23 1618    Gustavus Bryant, [place] 01/04/23 [phone]

## 2023-01-04 NOTE — Discharge Instructions (Addendum)
Blood work and COVID test are pending.  I will call if it is abnormal.  It appears that you have pneumonia so I have prescribed 2 antibiotics to take to treat this.  I also prescribed you a nausea medication and a cough medication.  Please try to avoid breast-feeding if you can while taking these medications.  Follow-up in 4 to 6 weeks with family medicine doctor to have repeat chest x-ray to ensure infection is clear.  Please bring back stool sample for testing.

## 2023-01-05 LAB — COMPREHENSIVE METABOLIC PANEL
ALT: 18 [IU]/L (ref 0–32)
AST: 17 [IU]/L (ref 0–40)
Albumin: 4.3 g/dL (ref 4.0–5.0)
Alkaline Phosphatase: 62 [IU]/L (ref 44–121)
BUN/Creatinine Ratio: 12 (ref 9–23)
BUN: 7 mg/dL (ref 6–20)
Bilirubin Total: <0.2 mg/dL (ref 0.0–1.2)
CO2: 21 mmol/L (ref 20–29)
Calcium: 9.1 mg/dL (ref 8.7–10.2)
Chloride: 105 mmol/L (ref 96–106)
Creatinine, Ser: 0.57 mg/dL (ref 0.57–1.00)
Globulin, Total: 2.8 g/dL (ref 1.5–4.5)
Glucose: 82 mg/dL (ref 70–99)
Potassium: 3.2 mmol/L — ABNORMAL LOW (ref 3.5–5.2)
Sodium: 141 mmol/L (ref 134–144)
Total Protein: 7.1 g/dL (ref 6.0–8.5)
eGFR: 131 mL/min/{1.73_m2} (ref >59)

## 2023-01-05 LAB — SARS CORONAVIRUS 2 (TAT 6-24 HRS): SARS Coronavirus 2: NEGATIVE

## 2023-01-05 LAB — CBC
Hematocrit: 38 % (ref 34.0–46.6)
Hemoglobin: 12.8 g/dL (ref 11.1–15.9)
MCH: 26.6 pg (ref 26.6–33.0)
MCHC: 33.7 g/dL (ref 31.5–35.7)
MCV: 79 fL (ref 79–97)
Platelets: 152 10*3/uL (ref 150–450)
RBC: 4.81 x10E6/uL (ref 3.77–5.28)
RDW: 13.2 % (ref 11.7–15.4)
WBC: 5 10*3/uL (ref 3.4–10.8)

## 2023-01-09 ENCOUNTER — Ambulatory Visit: Payer: Medicaid Other | Admitting: Cardiology

## 2023-01-09 ENCOUNTER — Other Ambulatory Visit (HOSPITAL_COMMUNITY): Payer: Self-pay

## 2023-01-09 ENCOUNTER — Ambulatory Visit (INDEPENDENT_AMBULATORY_CARE_PROVIDER_SITE_OTHER): Payer: Medicaid Other | Admitting: Cardiology

## 2023-01-09 ENCOUNTER — Encounter: Payer: Self-pay | Admitting: Cardiology

## 2023-01-09 VITALS — BP 138/96 | HR 93 | Ht 62.0 in | Wt 149.1 lb

## 2023-01-09 DIAGNOSIS — R0602 Shortness of breath: Secondary | ICD-10-CM

## 2023-01-09 DIAGNOSIS — R5383 Other fatigue: Secondary | ICD-10-CM | POA: Diagnosis not present

## 2023-01-09 DIAGNOSIS — I10 Essential (primary) hypertension: Secondary | ICD-10-CM

## 2023-01-09 MED ORDER — BLOOD PRESSURE MONITOR AUTOMAT DEVI
1.0000 [IU] | Freq: Once | 0 refills | Status: AC
  Filled 2023-01-09: qty 1, fill #0

## 2023-01-09 NOTE — Progress Notes (Unsigned)
Cardio-Obstetrics Clinic  New Evaluation  Date:  01/11/2023   ID:  Victoria Garrett, DOB 05/01/2000, MRN [id number]  PCP:  [facility]   [facility] Providers Cardiologist:  Thomasene Ripple, DO  Electrophysiologist:  None       Referring MD: Hurshel Party   Chief Complaint: " I am experiencing shortness of breath"  History of Present Illness:    Victoria Garrett is a 22 y.o. female [[id number]] who is being seen today for the evaluation of shortness of breath  at the request of Leftwich-Kirby, Wilmer Floor,*.    Medical history of iron deficiency anemia, presents with chronic fatigue, shortness of breath, and palpitations. They describe the palpitations as their heart 'pumping too hard.' These symptoms have been ongoing for years and have been previously attributed to iron deficiency anemia. However, the patient reports that their iron levels have been stable and they are not currently taking iron supplements.  The patient also reports frequent dizziness and lightheadedness. She maintains a healthy diet, cooking at home and avoiding fast food. Despite this, they continue to experience these symptoms.  In addition to these symptoms, the patient has recently been diagnosed with pneumonia and is experiencing side pain, raising concerns of a possible kidney infection. They also report a recent episode of prolonged bleeding, followed by spotting, despite being on Depo-Provera. A recent pregnancy test was negative.  The patient also reports a history of migraines, describing them as throbbing headaches that are exacerbated by light and require them to stay in a dark room.  Prior CV Studies Reviewed: The following studies were reviewed today:   Past Medical History:  Diagnosis Date   Anemia    Anxiety    ASCUS with positive high risk HPV cervical 04/11/2021   Benign gestational thrombocytopenia in third trimester (HCC) 02/28/2020   Plts 135 at new OB,  then 127 at 28 weeks Check platelets at 36 week visit [ ]     Deliberate self-cutting 03/20/2020   Depression    Generalized anxiety disorder with panic attacks 02/14/2020   Heart murmur    as a child    History of pyelonephritis during pregnancy 02/14/2020   Hydronephrosis, right 10/03/2015   Poor social situation 02/14/2020   Preterm uterine contractions in third trimester, antepartum 02/28/2020   Supervision of other normal pregnancy, antepartum 02/13/2020    Nursing Staff Provider Office Location [facility] XFER  Dating   6w 5d [place] Language  ENGLISH Anatomy US   WNL visible in Care everywhere 10/2019 Flu Vaccine  02/14/2020 Genetic Screen  NIPS:   AFP:   First Screen:  Quad:   TDaP Vaccine   02/14/2020 Hgb A1C or  GTT Normal 1 hour 01/04/2020 per [facility] COVID Vaccine NOT VACCINATED   LAB RESULTS  Rhogam   Blood Type    B POS Feeding Plan BREASTFEED A   Thrombocytopenia (HCC)    UTI in pregnancy 03/02/2020   Vs bacteriuria. Treated 1/14. [ ]  TOC   Vaginal Pap smear, abnormal     Past Surgical History:  Procedure Laterality Date   IR NEPHROURETERAL CATH PLACE RIGHT Right 10/2015   NEPHROSTOMY TUBE PLACEMENT ([facility] HX)        OB History     Gravida  3   Para  3   Term  3   Preterm      AB      Living  3      SAB      IAB  Ectopic      Multiple  0   Live Births  3               Current Medications: Current Meds  Medication Sig   amoxicillin-clavulanate (AUGMENTIN) 875-125 MG tablet Take 1 tablet by mouth every 12 (twelve) hours.   benzocaine-Menthol (DERMOPLAST) 20-0.5 % AERO Apply 1 Application topically as needed for irritation (perineal discomfort).   [EXPIRED] Blood Pressure Monitoring (BLOOD PRESSURE MONITOR AUTOMAT) DEVI Use as directed to monitor blood pressure.   dextromethorphan 15 MG/5ML syrup Take 10 mLs (30 mg total) by mouth every 6 (six) hours as needed for cough.   doxycycline (VIBRAMYCIN) 100 MG capsule Take 1 capsule (100 mg total) by  mouth 2 (two) times daily for 7 days.   medroxyPROGESTERone (DEPO-PROVERA) 150 MG/ML injection Inject 1 mL (150 mg total) into the muscle every 3 (three) months.     Allergies:   Patient has no known allergies.   Social History   Socioeconomic History   Marital status: Single    Spouse name: Not on file   Number of children: 1   Years of education: Not on file   Highest education level: Not on file  Occupational History   Not on file  Tobacco Use   Smoking status: Never   Smokeless tobacco: Never  Vaping Use   Vaping status: Former   Substances: Flavoring  Substance and Sexual Activity   Alcohol use: Not Currently   Drug use: Not Currently    Types: Marijuana    Comment: last smoked 1st week July 2024   Sexual activity: Yes    Partners: Male    Birth control/protection: None  Other Topics Concern   Not on file  Social History Narrative   Not on file   Social Determinants of Health   Financial Resource Strain: High Risk (02/23/2022)   Received from [facility], [facility]   Overall Financial Resource Strain (CARDIA)    Difficulty of Paying Living Expenses: Hard  Food Insecurity: No Food Insecurity (08/31/2022)   Hunger Vital Sign    Worried About Running Out of Food in the Last Year: Never true    Ran Out of Food in the Last Year: Never true  Transportation Needs: No Transportation Needs (08/31/2022)   PRAPARE - Administrator, Civil Service (Medical): No    Lack of Transportation (Non-Medical): No  Physical Activity: Not on file  Stress: Stress Concern Present (02/23/2022)   Received from [facility] of Occupational Health - Occupational Stress Questionnaire    Feeling of Stress : Very much  Social Connections: Unknown (06/28/2021)   Received from [facility], [facility]   Social Network    Social Network: Not on file      Family History  Problem Relation Age of Onset   Healthy Mother    Healthy Father     Asthma Sister    Diabetes Maternal Grandmother       ROS:   Please see the history of present illness.    Shortness of breath, fatigue All other systems reviewed and are negative.   Labs/EKG Reviewed:    EKG:   EKG was not ordered today.   Recent Labs: 03/01/2022: Magnesium 2.0 12/01/2022: TSH 1.650 01/04/2023: ALT 18; BUN 7; Creatinine, Ser 0.57; Hemoglobin 12.8; Platelets 152; Potassium 3.2; Sodium 141   Recent Lipid Panel No results found for: "CHOL", "TRIG", "HDL", "CHOLHDL", "LDLCALC", "LDLDIRECT"  Physical Exam:  VS:  BP (!) 138/96 (BP Location: Left Arm, Patient Position: Sitting, Cuff Size: Normal)   Pulse 93   Ht 5\' 2"  (1.575 m)   Wt 149 lb 1.6 oz (67.6 kg)   SpO2 97%   BMI 27.27 kg/m     Wt Readings from Last 3 Encounters:  01/09/23 149 lb 1.6 oz (67.6 kg)  12/01/22 153 lb 3.2 oz (69.5 kg)  08/31/22 175 lb 9.6 oz (79.7 kg)     GEN:  Well nourished, well developed in no acute distress HEENT: Normal NECK: No JVD; No carotid bruits LYMPHATICS: No lymphadenopathy CARDIAC: RRR, no murmurs, rubs, gallops RESPIRATORY:  Clear to auscultation without rales, wheezing or rhonchi  ABDOMEN: Soft, non-tender, non-distended MUSCULOSKELETAL:  No edema; No deformity  SKIN: Warm and dry NEUROLOGIC:  Alert and oriented x 3 PSYCHIATRIC:  Normal affect    Risk Assessment/Risk Calculators:                 ASSESSMENT & PLAN:    Hypertension Elevated blood pressure noted during visit. No history of hypertension treatment. Discussed the need for daily blood pressure monitoring and potential initiation of antihypertensive medication. -Order blood pressure cuff from [facility] pharmacy. -Advise patient to monitor blood pressure daily. -Schedule follow-up with pharmacist in 2 weeks to review blood pressure readings and discuss potential need for antihypertensive medication.  Fatigue and Shortness of Breath Chronic symptoms with history of iron deficiency  anemia. Hemoglobin level recently checked and reported as normal. Patient reports palpitations and feeling of heart "pumping too hard." -Order echocardiogram to assess cardiac function.   Possible Pregnancy Patient reports recent changes in menstrual bleeding and has history of Depo-Provera use. Recent home pregnancy test was negative. -Consider pregnancy test if menstrual changes persist.  Vitamin D Deficiency Patient reports chronic fatigue. No known history of vitamin D level check. -Order vitamin D level.    Patient Instructions  Medication Instructions:  Your physician recommends that you continue on your current medications as directed. Please refer to the Current Medication list given to you today.  *If you need a refill on your cardiac medications before your next appointment, please call your pharmacy*   Lab Work: Vit D If you have labs (blood work) drawn today and your tests are completely normal, you will receive your results only by: MyChart Message (if you have MyChart) OR A paper copy in the mail If you have any lab test that is abnormal or we need to change your treatment, we will call you to review the results.   Testing/Procedures: Your physician has requested that you have an echocardiogram. Echocardiography is a painless test that uses sound waves to create images of your heart. It provides your doctor with information about the size and shape of your heart and how well your heart's chambers and valves are working. This procedure takes approximately one hour. There are no restrictions for this procedure. Please do NOT wear cologne, perfume, aftershave, or lotions (deodorant is allowed). Please arrive 15 minutes prior to your appointment time.  Please note: We ask at that you not bring children with you during ultrasound (echo/ vascular) testing. Due to room size and safety concerns, children are not allowed in the ultrasound rooms during exams. Our front office  staff cannot provide observation of children in our lobby area while testing is being conducted. An adult accompanying a patient to their appointment will only be allowed in the ultrasound room at the discretion of the ultrasound technician under  special circumstances. We apologize for any inconvenience.    Follow-Up: At [facility], you and your health needs are our priority.  As part of our continuing mission to provide you with exceptional heart care, we have created designated Provider Care Teams.  These Care Teams include your primary Cardiologist (physician) and Advanced Practice Providers (APPs -  Physician Assistants and Nurse Practitioners) who all work together to provide you with the care you need, when you need it.   Your next appointment:   12 week(s) NL  Provider:   Thomasene Ripple, DO [address], [place], [place] [address]  Other Instructions Please see our pharmacy staff Thayer Ohm P) in 2 weeks.     Dispo:  No follow-ups on file.   Medication Adjustments/Labs and Tests Ordered: Current medicines are reviewed at length with the patient today.  Concerns regarding medicines are outlined above.  Tests Ordered: Orders Placed This Encounter  Procedures   VITAMIN D 25 Hydroxy (Vit-D Deficiency, Fractures)   AMB Referral to [facility] Pharm-D   ECHOCARDIOGRAM COMPLETE   Medication Changes: Meds ordered this encounter  Medications   Blood Pressure Monitoring (BLOOD PRESSURE MONITOR AUTOMAT) DEVI    Sig: Use as directed to monitor blood pressure.    Dispense:  1 each    Refill:  0    Please provide patient with blood pressure cuff and monitor.

## 2023-01-09 NOTE — Patient Instructions (Signed)
Medication Instructions:  Your physician recommends that you continue on your current medications as directed. Please refer to the Current Medication list given to you today.  *If you need a refill on your cardiac medications before your next appointment, please call your pharmacy*   Lab Work: Vit D If you have labs (blood work) drawn today and your tests are completely normal, you will receive your results only by: MyChart Message (if you have MyChart) OR A paper copy in the mail If you have any lab test that is abnormal or we need to change your treatment, we will call you to review the results.   Testing/Procedures: Your physician has requested that you have an echocardiogram. Echocardiography is a painless test that uses sound waves to create images of your heart. It provides your doctor with information about the size and shape of your heart and how well your heart's chambers and valves are working. This procedure takes approximately one hour. There are no restrictions for this procedure. Please do NOT wear cologne, perfume, aftershave, or lotions (deodorant is allowed). Please arrive 15 minutes prior to your appointment time.  Please note: We ask at that you not bring children with you during ultrasound (echo/ vascular) testing. Due to room size and safety concerns, children are not allowed in the ultrasound rooms during exams. Our front office staff cannot provide observation of children in our lobby area while testing is being conducted. An adult accompanying a patient to their appointment will only be allowed in the ultrasound room at the discretion of the ultrasound technician under special circumstances. We apologize for any inconvenience.    Follow-Up: At [facility], you and your health needs are our priority.  As part of our continuing mission to provide you with exceptional heart care, we have created designated Provider Care Teams.  These Care Teams include your  primary Cardiologist (physician) and Advanced Practice Providers (APPs -  Physician Assistants and Nurse Practitioners) who all work together to provide you with the care you need, when you need it.   Your next appointment:   12 week(s) NL  Provider:   Thomasene Ripple, DO [address], [place], [place] [address]  Other Instructions Please see our pharmacy staff Thayer Ohm P) in 2 weeks.

## 2023-01-14 ENCOUNTER — Ambulatory Visit: Payer: Medicaid Other | Attending: Cardiology

## 2023-02-02 ENCOUNTER — Ambulatory Visit: Payer: Medicaid Other | Attending: Cardiovascular Disease

## 2023-02-02 NOTE — Progress Notes (Unsigned)
Patient ID: Victoria Garrett                 DOB: 2000/12/28                      MRN: [id number]     HPI: Victoria Garrett is a 22 y.o. female referred by Dr. [name] to HTN clinic. PMH is significant for  Current HTN meds:  Previously tried:  BP goal:   Family History:   Social History:   Diet:   Exercise:   Home BP readings:   Wt Readings from Last 3 Encounters:  01/09/23 149 lb 1.6 oz (67.6 kg)  12/01/22 153 lb 3.2 oz (69.5 kg)  08/31/22 175 lb 9.6 oz (79.7 kg)   BP Readings from Last 3 Encounters:  01/09/23 (!) 138/96  01/04/23 109/62  12/01/22 130/81   Pulse Readings from Last 3 Encounters:  01/09/23 93  01/04/23 70  12/01/22 81    Renal function: CrCl cannot be calculated (Patient's most recent lab result is older than the maximum 21 days allowed.).  Past Medical History:  Diagnosis Date   Anemia    Anxiety    ASCUS with positive high risk HPV cervical 04/11/2021   Benign gestational thrombocytopenia in third trimester (HCC) 02/28/2020   Plts 135 at new OB, then 127 at 28 weeks Check platelets at 36 week visit [ ]     Deliberate self-cutting 03/20/2020   Depression    Generalized anxiety disorder with panic attacks 02/14/2020   Heart murmur    as a child    History of pyelonephritis during pregnancy 02/14/2020   Hydronephrosis, right 10/03/2015   Poor social situation 02/14/2020   Preterm uterine contractions in third trimester, antepartum 02/28/2020   Supervision of other normal pregnancy, antepartum 02/13/2020    Nursing Staff Provider Office Location [facility] XFER  Dating   6w 5d [place] Language  ENGLISH Anatomy US   WNL visible in Care everywhere 10/2019 Flu Vaccine  02/14/2020 Genetic Screen  NIPS:   AFP:   First Screen:  Quad:   TDaP Vaccine   02/14/2020 Hgb A1C or  GTT Normal 1 hour 01/04/2020 per [facility] COVID Vaccine NOT VACCINATED   LAB RESULTS  Rhogam   Blood Type    B POS Feeding Plan BREASTFEED A   Thrombocytopenia (HCC)    UTI in  pregnancy 03/02/2020   Vs bacteriuria. Treated 1/14. [ ]  TOC   Vaginal Pap smear, abnormal     Current Outpatient Medications on File Prior to Visit  Medication Sig Dispense Refill   acetaminophen (TYLENOL) 325 MG tablet Take 2 tablets (650 mg total) by mouth every 4 (four) hours as needed (for pain scale < 4). (Patient not taking: Reported on 12/01/2022) 100 tablet 0   amoxicillin-clavulanate (AUGMENTIN) 875-125 MG tablet Take 1 tablet by mouth every 12 (twelve) hours. 14 tablet 0   benzocaine-Menthol (DERMOPLAST) 20-0.5 % AERO Apply 1 Application topically as needed for irritation (perineal discomfort). 78 g 0   dextromethorphan 15 MG/5ML syrup Take 10 mLs (30 mg total) by mouth every 6 (six) hours as needed for cough. 120 mL 0   ibuprofen (ADVIL) 600 MG tablet Take 1 tablet (600 mg total) by mouth every 6 (six) hours. (Patient not taking: Reported on 12/01/2022) 30 tablet 0   medroxyPROGESTERone (DEPO-PROVERA) 150 MG/ML injection Inject 1 mL (150 mg total) into the muscle every 3 (three) months. 1 mL 0   ondansetron (ZOFRAN-ODT) 4 MG disintegrating tablet  Take 1 tablet (4 mg total) by mouth every 8 (eight) hours as needed for nausea or vomiting. (Patient not taking: Reported on 01/09/2023) 20 tablet 0   senna-docusate (SENOKOT-S) 8.6-50 MG tablet Take 2 tablets by mouth daily. (Patient not taking: Reported on 12/01/2022) 30 tablet 0   sertraline (ZOLOFT) 50 MG tablet Take 1 tablet (50 mg total) by mouth daily. (Patient not taking: Reported on 12/01/2022) 30 tablet 2   No current facility-administered medications on file prior to visit.    No Known Allergies   Assessment/Plan:  1. Hypertension -

## 2023-02-05 NOTE — Telephone Encounter (Signed)
-----   Message from Ok Edwards sent at 02/05/2023  8:43 AM EST ----- Regarding: No Show Again?? I saw where she had another PharmD for 02-02-23...did she show??  If not, we need to resch her yet again..02-05-23 VB

## 2023-02-18 IMAGING — CT CT ABD-PELV W/ CM
2 of 4 series · 16 of 46 positions shown, 18 images · IV contrast (APPLIED)
Comparison: None.

CLINICAL DATA: One day of generalized abdominal and back pain.

EXAM:
CT ABDOMEN AND PELVIS WITH CONTRAST
TECHNIQUE: Multidetector CT imaging of the abdomen and pelvis was performed
using the standard protocol following bolus administration of
intravenous contrast.
CONTRAST:  80mL OMNIPAQUE IOHEXOL 350 MG/ML SOLN

[Series 3: abd/ pelvis 5.0 i30f 2 · axial · 0.85mm/px · z∈[+812,+1267]mm · 13 of 99 slices shown, 15 images]
[im 4/99  soft-tissue]
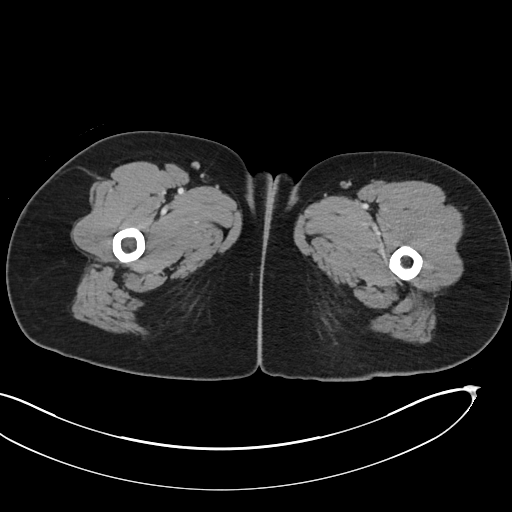
[im 4/99  bone]
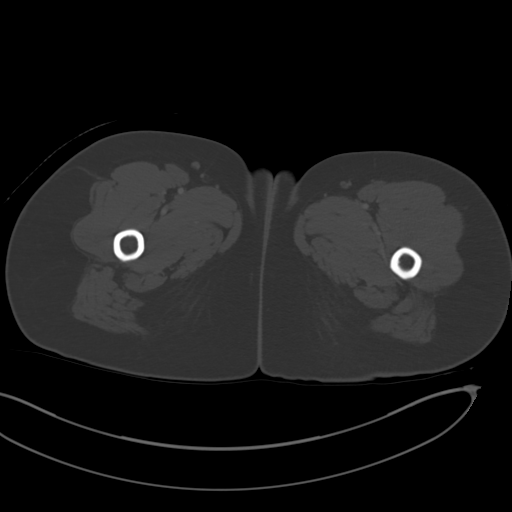
[im 12/99  soft-tissue]
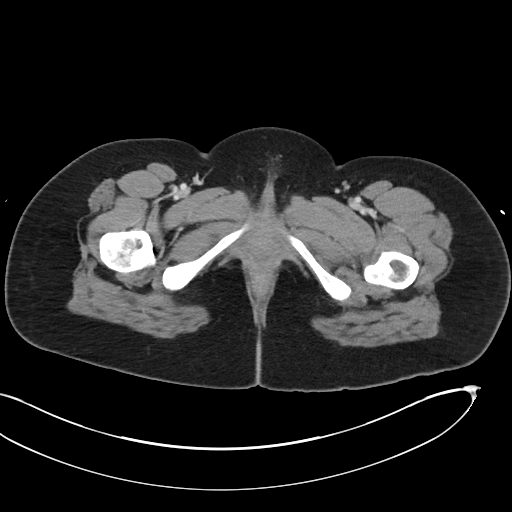
[im 20/99  soft-tissue]
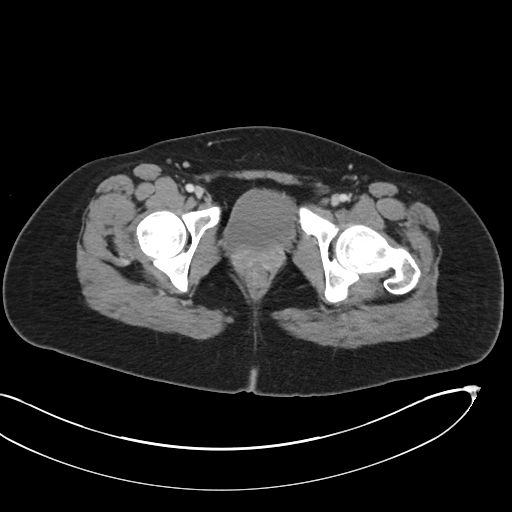
[im 28/99  soft-tissue]
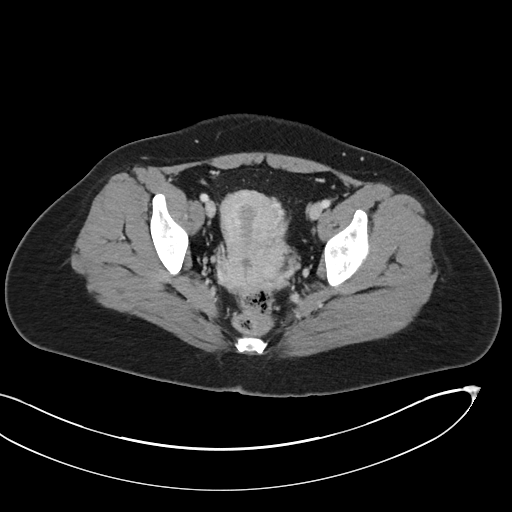
[im 36/99  soft-tissue]
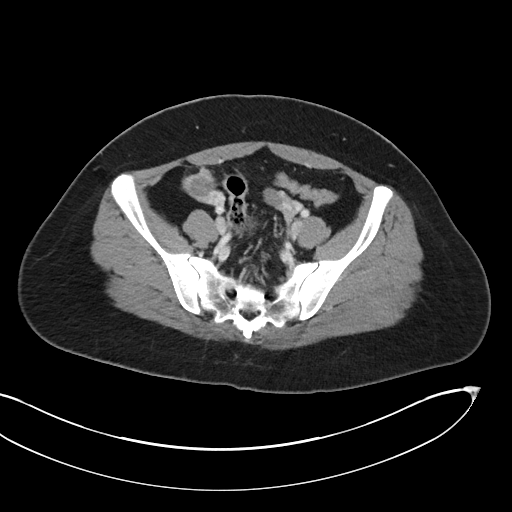
[im 44/99  soft-tissue]
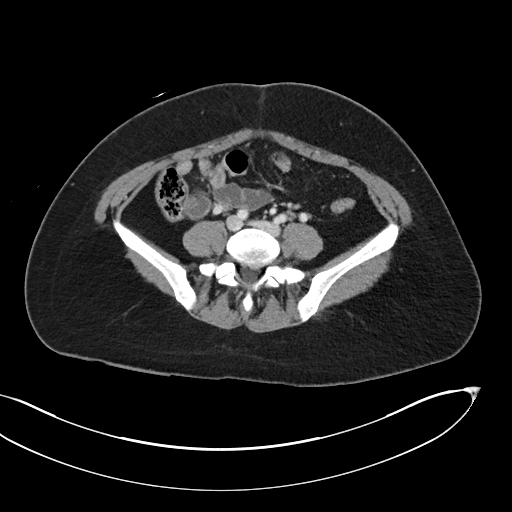
[im 51/99  soft-tissue]
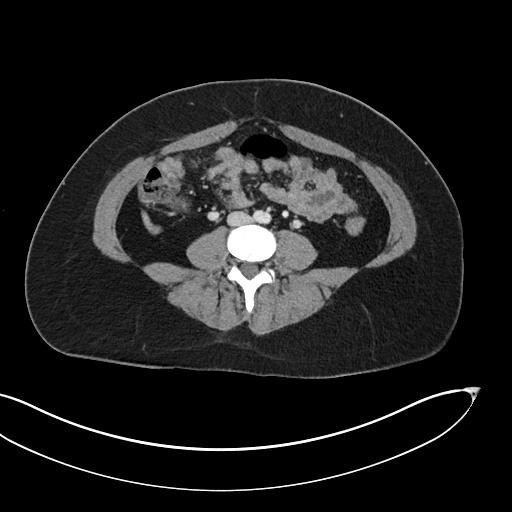
[im 55/99  soft-tissue]
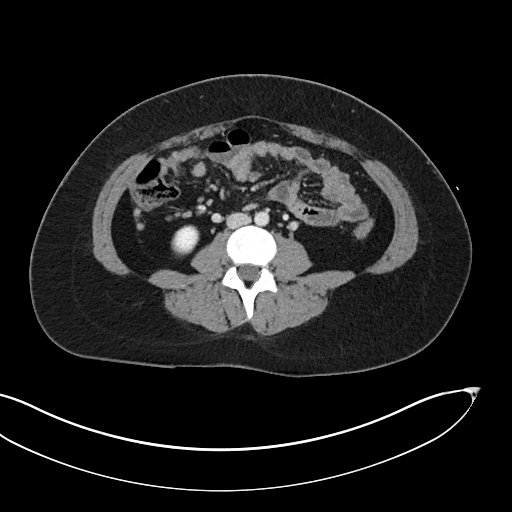
[im 63/99  soft-tissue]
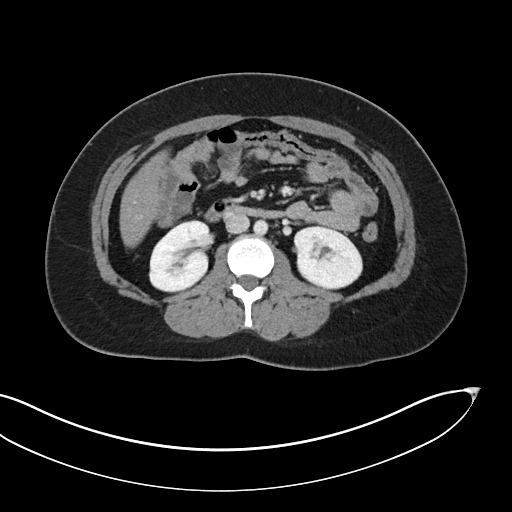
[im 63/99  bone]
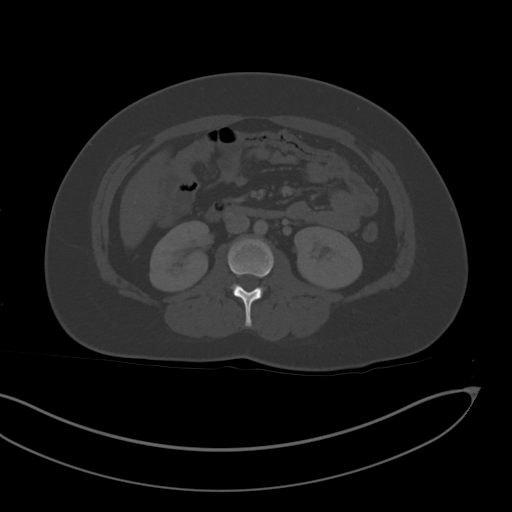
[im 71/99  soft-tissue]
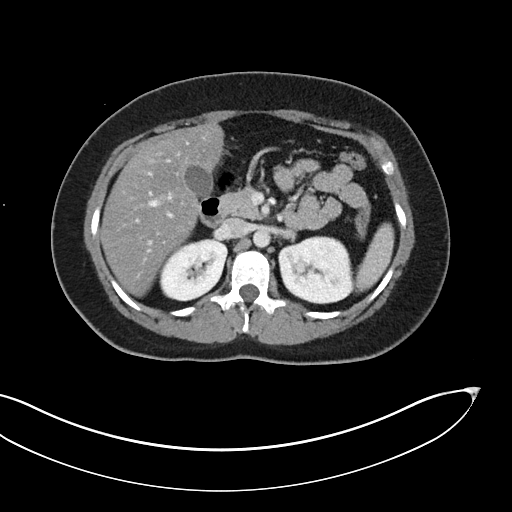
[im 79/99  soft-tissue]
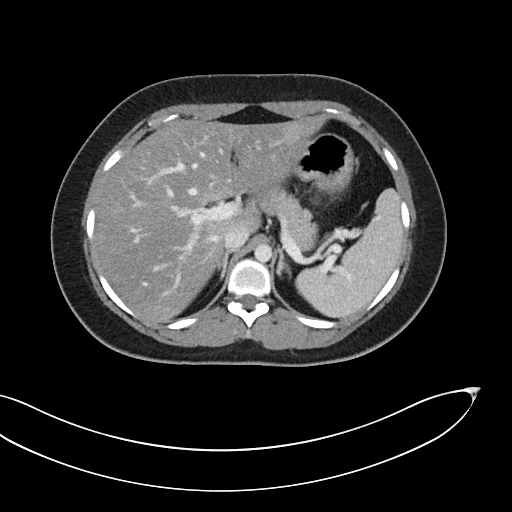
[im 87/99  soft-tissue]
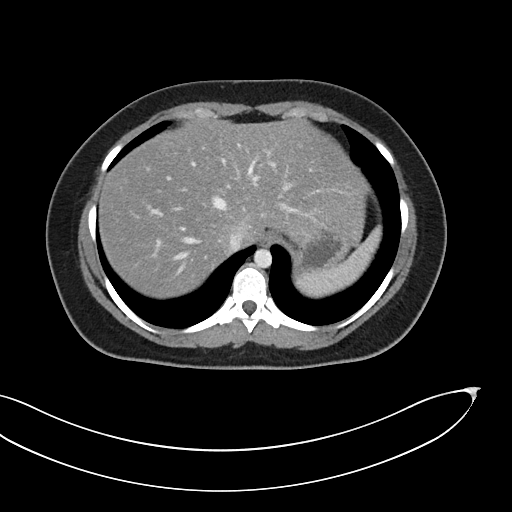
[im 95/99  soft-tissue]
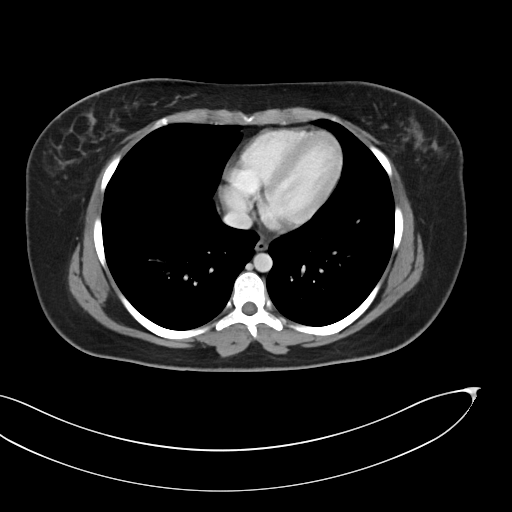

[Series 6: coronal soft tissue · coronal · 0.88mm/px · 3 of 101 slices shown]
[im 34/101  soft-tissue]
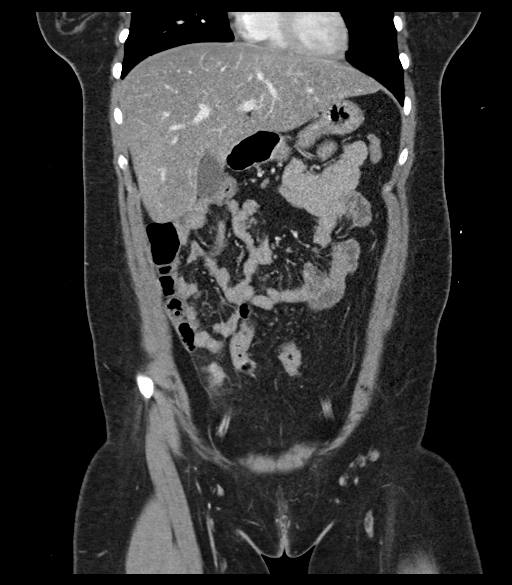
[im 45/101  soft-tissue]
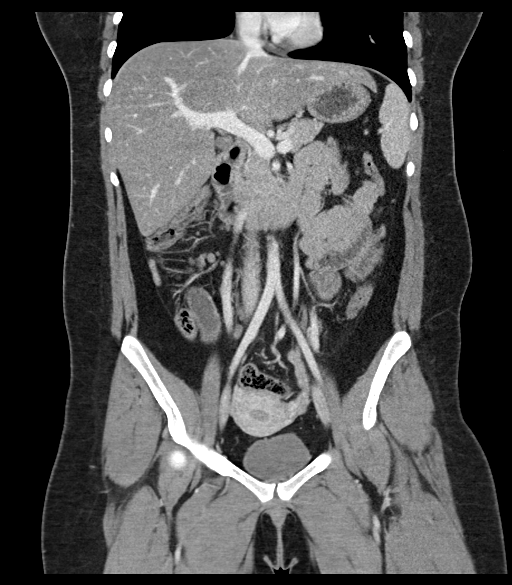
[im 56/101  soft-tissue]
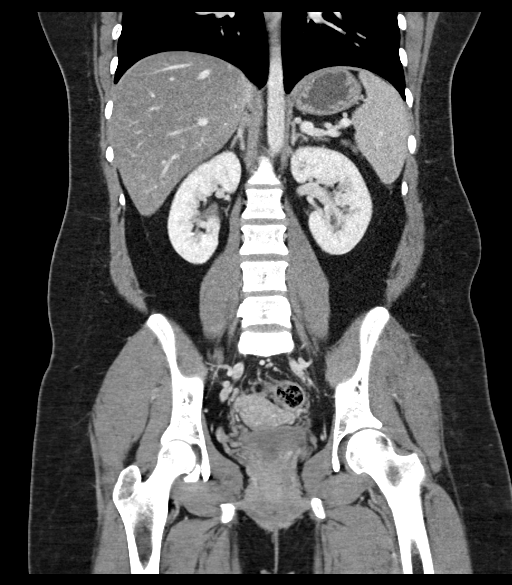

[16 of 46 positions shown; findings below may reference images not displayed]

FINDINGS: Lower chest: No acute abnormality.

Hepatobiliary: Diffuse hepatic steatosis. Gallbladder is
unremarkable. No biliary ductal dilation.

Pancreas: No pancreatic ductal dilation or evidence of acute
inflammation.

Spleen: Within normal limits.

Adrenals/Urinary Tract: Adrenal glands are unremarkable. Kidneys are
normal, without renal calculi, solid enhancing lesion, or
hydronephrosis. Bladder is unremarkable for degree of distension.

Stomach/Bowel: Stomach is unremarkable for degree of distension. No
pathologic dilation of small or large bowel. The appendix and
terminal ileum appear normal.

Vascular/Lymphatic: No significant vascular findings are present. No
pathologically enlarged abdominal or pelvic lymph nodes.

Reproductive: Uterus is unremarkable. Simple appearing 2.5 cm right
ovarian cyst. There is some pelvic free fluid tracking from the
right ovarian cyst into the pelvis which may be physiologic or
reflect cyst rupture. Simple appearing 1.7 cm left ovarian cyst
likely reflecting a dominant follicle.

Other: Trace pelvic free fluid.  No pneumoperitoneum.

Musculoskeletal: No acute or significant osseous findings.
IMPRESSION: 1. Simple appearing bilateral ovarian cysts measuring up to 2.5 cm.
No follow-up imaging is recommended.
Reference: JACR [DATE]):[id number].
2. There is trace pelvic free fluid tracking from the right ovarian
cyst into the pelvis which may be physiologic or reflect cyst
rupture.
3. Diffuse hepatic steatosis.
4. Normal appendix.

## 2023-02-24 ENCOUNTER — Encounter (HOSPITAL_COMMUNITY): Payer: Self-pay

## 2023-02-25 ENCOUNTER — Ambulatory Visit (HOSPITAL_COMMUNITY): Payer: Medicaid Other

## 2023-03-16 ENCOUNTER — Ambulatory Visit (HOSPITAL_COMMUNITY): Payer: Medicaid Other | Attending: Cardiology

## 2023-03-16 DIAGNOSIS — R0602 Shortness of breath: Secondary | ICD-10-CM | POA: Diagnosis not present

## 2023-03-16 LAB — ECHOCARDIOGRAM COMPLETE
Area-P 1/2: 4.39 cm2
Calc EF: 61.9 %
S' Lateral: 2.64 cm
Single Plane A2C EF: 60.9 %
Single Plane A4C EF: 62.4 %

## 2023-03-20 ENCOUNTER — Encounter: Payer: Self-pay | Admitting: Emergency Medicine

## 2023-03-20 ENCOUNTER — Ambulatory Visit
Admission: EM | Admit: 2023-03-20 | Discharge: 2023-03-20 | Disposition: A | Discharge status: Home / Self Care | Payer: Medicaid Other

## 2023-03-20 ENCOUNTER — Ambulatory Visit (HOSPITAL_BASED_OUTPATIENT_CLINIC_OR_DEPARTMENT_OTHER)
Admission: RE | Admit: 2023-03-20 | Discharge: 2023-03-20 | Disposition: A | Discharge status: Home / Self Care | Payer: Medicaid Other | Source: Ambulatory Visit | Attending: Internal Medicine | Admitting: Internal Medicine

## 2023-03-20 DIAGNOSIS — R509 Fever, unspecified: Secondary | ICD-10-CM | POA: Diagnosis present

## 2023-03-20 DIAGNOSIS — R051 Acute cough: Secondary | ICD-10-CM | POA: Diagnosis present

## 2023-03-20 DIAGNOSIS — R071 Chest pain on breathing: Secondary | ICD-10-CM | POA: Diagnosis present

## 2023-03-20 LAB — POCT INFLUENZA A/B
Influenza A, POC: NEGATIVE
Influenza B, POC: NEGATIVE

## 2023-03-20 LAB — POCT RAPID STREP A (OFFICE): Rapid Strep A Screen: NEGATIVE

## 2023-03-20 MED ORDER — BENZONATATE 200 MG PO CAPS: 200.0000 mg | ORAL_CAPSULE | Freq: Three times a day (TID) | ORAL | 0 refills | Status: DC | PRN

## 2023-03-20 MED ORDER — IBUPROFEN 800 MG PO TABS: 800.0000 mg | ORAL_TABLET | Freq: Three times a day (TID) | ORAL | 0 refills | Status: DC

## 2023-03-20 MED ORDER — OSELTAMIVIR PHOSPHATE 75 MG PO CAPS: 75.0000 mg | ORAL_CAPSULE | Freq: Two times a day (BID) | ORAL | 0 refills | Status: DC

## 2023-03-20 NOTE — ED Triage Notes (Addendum)
Pt reports productive cough, chills, body aches, and earache (more on R side) that started yesterday. Max temp: 100.6 yesterday. No fevers today. Pt reports chest pain started yesterday with the cough and has been nearly constant. Describes chest pressure and tightness that radiates to back. Notes lightheadedness and diaphoresis intermittently. Not taking any meds currently. Currently breastfeeding. No SOB or acute distress currently.

## 2023-03-20 NOTE — ED Provider Notes (Signed)
[facility]    CSN: [id number] Arrival date & time: 03/20/23  1214      History   Chief Complaint Chief Complaint  Patient presents with   Cough   Chest Pain    HPI Victoria Garrett is a 23 y.o. female who presents with onset of productive cough, chills, body aches, and fever as high as 100.6 since yesterday. Has not had a fever today. States has CP with cough described as pressure that radiates to  her back. Has felt light headed and sweats off and on. Has not been taking any medications. Denies SOB. She is also having R ear pain since yesterday which felt part of the HA she had on her R forehead and temple. She does not have rhinitis but mild nose congestion.  She is currently breast feeding.    Past Medical History:  Diagnosis Date   Anemia    Anxiety    ASCUS with positive high risk HPV cervical 04/11/2021   Benign gestational thrombocytopenia in third trimester (HCC) 02/28/2020   Plts 135 at new OB, then 127 at 28 weeks Check platelets at 36 week visit [ ]     Deliberate self-cutting 03/20/2020   Depression    Generalized anxiety disorder with panic attacks 02/14/2020   Heart murmur    as a child    History of pyelonephritis during pregnancy 02/14/2020   Hydronephrosis, right 10/03/2015   Poor social situation 02/14/2020   Preterm uterine contractions in third trimester, antepartum 02/28/2020   Supervision of other normal pregnancy, antepartum 02/13/2020    Nursing Staff Provider Office Location [facility]  Dating   6w 5d [place] Language  ENGLISH Anatomy US   WNL visible in Care everywhere 10/2019 Flu Vaccine  02/14/2020 Genetic Screen  NIPS:   AFP:   First Screen:  Quad:   TDaP Vaccine   02/14/2020 Hgb A1C or  GTT Normal 1 hour 01/04/2020 per [facility] COVID Vaccine NOT VACCINATED   LAB RESULTS  Rhogam   Blood Type    B POS Feeding Plan BREASTFEED A   Thrombocytopenia (HCC)    UTI in pregnancy 03/02/2020   Vs bacteriuria. Treated 1/14. [ ]  TOC    Vaginal Pap smear, abnormal     Patient Active Problem List   Diagnosis Date Noted   Suicide attempt by drug overdose (HCC) 04/18/2022   Severe recurrent major depression without psychotic features (HCC) 03/03/2022   Moderate cannabis use disorder (HCC) 02/23/2022   Thrombocytopenia (HCC) 02/23/2022   MDD (major depressive disorder), recurrent severe, without psychosis (HCC) 02/22/2022   Poor social situation 02/14/2020   PTSD (post-traumatic stress disorder) 03/03/2018   Generalized anxiety disorder 11/25/2017    Past Surgical History:  Procedure Laterality Date   IR NEPHROURETERAL CATH PLACE RIGHT Right 10/2015   NEPHROSTOMY TUBE PLACEMENT ([facility] HX)      OB History     Gravida  3   Para  3   Term  3   Preterm      AB      Living  3      SAB      IAB      Ectopic      Multiple  0   Live Births  3            Home Medications    Prior to Admission medications   Medication Sig Start Date End Date Taking? Authorizing Provider  benzonatate (TESSALON) 200 MG capsule Take 1 capsule (200  mg total) by mouth 3 (three) times daily as needed for cough. 03/20/23  Yes Rodriguez-Southworth, Nettie Elm, PA-C  ibuprofen (ADVIL) 800 MG tablet Take 1 tablet (800 mg total) by mouth 3 (three) times daily. 03/20/23  Yes Rodriguez-Southworth, Nettie Elm, PA-C  oseltamivir (TAMIFLU) 75 MG capsule Take 1 capsule (75 mg total) by mouth every 12 (twelve) hours. 03/20/23  Yes Rodriguez-Southworth, Nettie Elm, PA-C    Family History Family History  Problem Relation Age of Onset   Healthy Mother    Healthy Father    Asthma Sister    Diabetes Maternal Grandmother     Social History Social History   Tobacco Use   Smoking status: Never   Smokeless tobacco: Never  Vaping Use   Vaping status: Former   Substances: Flavoring  Substance Use Topics   Alcohol use: Not Currently   Drug use: Not Currently    Types: Marijuana    Comment: last smoked 1st week July 2024     Allergies    Patient has no known allergies.   Review of Systems Review of Systems  As noted in HPI Physical Exam Triage Vital Signs ED Triage Vitals [03/20/23 1231]  Encounter Vitals Group     BP 128/89     Systolic BP Percentile      Diastolic BP Percentile      Pulse Rate (!) 117     Resp 18     Temp 98.2 F (36.8 C)     Temp Source Oral     SpO2 97 %     Weight      Height      Head Circumference      Peak Flow      Pain Score      Pain Loc      Pain Education      Exclude from Growth Chart    No data found.  Updated Vital Signs BP 128/89 (BP Location: Right Arm)   Pulse (!) 117   Temp 98.2 F (36.8 C) (Oral)   Resp 18   LMP  (LMP Unknown)   SpO2 97%   Breastfeeding Yes   Visual Acuity Right Eye Distance:   Left Eye Distance:   Bilateral Distance:    Right Eye Near:   Left Eye Near:    Bilateral Near:     Physical Exam Physical Exam Vitals signs and nursing note reviewed.  Constitutional:      General: She is not in acute distress.    Appearance: Normal appearance. She is not ill-appearing, toxic-appearing or diaphoretic.  HENT:     Head: Normocephalic.     Right Ear: Tympanic membrane, ear canal and external ear normal.     Left Ear: Tympanic membrane, ear canal and external ear normal.     Nose: Nose normal.     Mouth/Throat: mild erythema    Mouth: Mucous membranes are moist.  Eyes:     General: No scleral icterus.       Right eye: No discharge.        Left eye: No discharge.     Conjunctiva/sclera: Conjunctivae normal.  Neck:     Musculoskeletal: Neck supple. No neck rigidity.  Cardiovascular:     Rate and Rhythm: Normal rate and regular rhythm.     Heart sounds: No murmur.  Pulmonary:     Effort: Pulmonary effort is normal.     Breath sounds: Normal breath sounds.  Musculoskeletal: Normal range of motion.  Lymphadenopathy:  Cervical: No cervical adenopathy.  Skin:    General: Skin is warm and dry.     Coloration: Skin is not jaundiced.      Findings: No rash.  Neurological:     Mental Status: She is alert and oriented to person, place, and time.     Gait: Gait normal.  Psychiatric:        Mood and Affect: Mood normal.        Behavior: Behavior normal.        Thought Content: Thought content normal.        Judgment: Judgment normal.    UC Treatments / Results  Labs (all labs ordered are listed, but only abnormal results are displayed) Labs Reviewed  POCT INFLUENZA A/B - Normal  POCT RAPID STREP A (OFFICE)  Strep and Flu test are negative  EKG   Radiology DG Chest 2 View Result Date: 03/20/2023 CLINICAL DATA:  Cough, fever, and left-sided chest pain. EXAM: CHEST - 2 VIEW COMPARISON:  01/04/2023 FINDINGS: Previous area of left lung consolidation has cleared in the interval. No new or developing consolidation. No pleural effusions. No pneumothorax. Heart size and pulmonary vascularity are normal. Mediastinal contours are intact. IMPRESSION: No active cardiopulmonary disease. Electronically Signed   By: Burman Nieves M.D.   On: 03/20/2023 16:59    Procedures Procedures (including critical care time)  Medications Ordered in UC Medications - No data to display  Initial Impression / Assessment and Plan / UC Course  I have reviewed the triage vital signs and the nursing notes.  Pertinent labs & imaging results that were available during my care of the patient were reviewed by me and considered in my medical decision making (see chart for details).  Flu like illness Possible pleurisy  I placed her on Ibuprofen and Tessalon as noted and sent for a chest xray and  I will inform her of those results later. If negative CXR then she may start  the tamiflu since it may be too early to detect Flu since her symptoms started yesterday pm.   Pt was informed via mychart her chest xray was negative  Final Clinical Impressions(s) / UC Diagnoses   Final diagnoses:  Acute cough  Chest pain on breathing  Fever,  unspecified     Discharge Instructions      It is still possible you may have influenza A and is too early to pick up on the test, so I am sending medicine to cover that. Fill it if the chest xray does not show pneumonia.      ED Prescriptions     Medication Sig Dispense Auth. Provider   ibuprofen (ADVIL) 800 MG tablet Take 1 tablet (800 mg total) by mouth 3 (three) times daily. 30 tablet Rodriguez-Southworth, Myrel Rappleye, PA-C   benzonatate (TESSALON) 200 MG capsule Take 1 capsule (200 mg total) by mouth 3 (three) times daily as needed for cough. 30 capsule Rodriguez-Southworth, Nettie Elm, PA-C   oseltamivir (TAMIFLU) 75 MG capsule Take 1 capsule (75 mg total) by mouth every 12 (twelve) hours. 10 capsule Rodriguez-Southworth, Nettie Elm, PA-C      PDMP not reviewed this encounter.   Garey Ham, Cordelia Poche 03/20/23 4782

## 2023-03-20 NOTE — Discharge Instructions (Addendum)
It is still possible you may have influenza A and is too early to pick up on the test, so I am sending medicine to cover that. Fill it if the chest xray does not show pneumonia.

## 2023-03-24 ENCOUNTER — Encounter: Payer: Self-pay | Admitting: Cardiology

## 2023-03-27 ENCOUNTER — Encounter (HOSPITAL_COMMUNITY): Payer: Self-pay

## 2023-03-27 ENCOUNTER — Emergency Department (HOSPITAL_COMMUNITY): Payer: Medicaid Other

## 2023-03-27 ENCOUNTER — Other Ambulatory Visit: Payer: Self-pay

## 2023-03-27 ENCOUNTER — Emergency Department (HOSPITAL_COMMUNITY)
Admission: EM | Admit: 2023-03-27 | Discharge: 2023-03-28 | Disposition: A | Discharge status: Home / Self Care | Payer: Medicaid Other | Attending: Emergency Medicine | Admitting: Emergency Medicine

## 2023-03-27 DIAGNOSIS — R519 Headache, unspecified: Secondary | ICD-10-CM | POA: Diagnosis present

## 2023-03-27 DIAGNOSIS — X501XXA Overexertion from prolonged static or awkward postures, initial encounter: Secondary | ICD-10-CM | POA: Insufficient documentation

## 2023-03-27 LAB — BASIC METABOLIC PANEL
Anion gap: 13 (ref 5–15)
BUN: 7 mg/dL (ref 6–20)
CO2: 24 mmol/L (ref 22–32)
Calcium: 9.5 mg/dL (ref 8.9–10.3)
Chloride: 104 mmol/L (ref 98–111)
Creatinine, Ser: 0.55 mg/dL (ref 0.44–1.00)
GFR, Estimated: >60 mL/min (ref >60)
Glucose, Bld: 126 mg/dL — ABNORMAL HIGH (ref 70–99)
Potassium: 3.3 mmol/L — ABNORMAL LOW (ref 3.5–5.1)
Sodium: 141 mmol/L (ref 135–145)

## 2023-03-27 LAB — CBC WITH DIFFERENTIAL/PLATELET
Abs Immature Granulocytes: 0.04 10*3/uL (ref 0.00–0.07)
Basophils Absolute: 0 10*3/uL (ref 0.0–0.1)
Basophils Relative: 0 %
Eosinophils Absolute: 0.1 10*3/uL (ref 0.0–0.5)
Eosinophils Relative: 1 %
HCT: 41 % (ref 36.0–46.0)
Hemoglobin: 14 g/dL (ref 12.0–15.0)
Immature Granulocytes: 0 %
Lymphocytes Relative: 26 %
Lymphs Abs: 2.9 10*3/uL (ref 0.7–4.0)
MCH: 27.9 pg (ref 26.0–34.0)
MCHC: 34.1 g/dL (ref 30.0–36.0)
MCV: 81.7 fL (ref 80.0–100.0)
Monocytes Absolute: 0.5 10*3/uL (ref 0.1–1.0)
Monocytes Relative: 4 %
Neutro Abs: 7.6 10*3/uL (ref 1.7–7.7)
Neutrophils Relative %: 69 %
Platelets: 191 10*3/uL (ref 150–400)
RBC: 5.02 MIL/uL (ref 3.87–5.11)
RDW: 13.1 % (ref 11.5–15.5)
WBC: 11.2 10*3/uL — ABNORMAL HIGH (ref 4.0–10.5)
nRBC: 0 % (ref 0.0–0.2)

## 2023-03-27 LAB — URINALYSIS, ROUTINE W REFLEX MICROSCOPIC
Bilirubin Urine: NEGATIVE
Glucose, UA: NEGATIVE mg/dL
Ketones, ur: 5 mg/dL — AB
Nitrite: NEGATIVE
Protein, ur: 30 mg/dL — AB
Specific Gravity, Urine: 1.031 — ABNORMAL HIGH (ref 1.005–1.030)
Squamous Epithelial / HPF: >50 /[HPF] (ref 0–5)
pH: 5 (ref 5.0–8.0)

## 2023-03-27 LAB — PREGNANCY, URINE: Preg Test, Ur: NEGATIVE

## 2023-03-27 NOTE — ED Provider Triage Note (Signed)
 Emergency Medicine Provider Triage Evaluation Note  Victoria Garrett , a 23 y.o. female  was evaluated in triage.  Pt complains of HA x 1 day, no meds taken.  Review of Systems  Positive: Multiple pops felt in head, neck pain, different from usual HA Negative: N/V/D  Physical Exam  BP (!) 135/96   Pulse (!) 106   Temp 98 F (36.7 C)   Resp 16   Ht 5' 2 (1.575 m)   Wt 71.7 kg   LMP  (LMP Unknown)   SpO2 100%   BMI 28.90 kg/m  Gen:   Awake, no distress   Resp:  Normal effort  MSK:   Moves extremities without difficulty  Other:    Medical Decision Making  Medically screening exam initiated at 8:18 PM.  Appropriate orders placed.  Victoria Garrett was informed that the remainder of the evaluation will be completed by another provider, this initial triage assessment does not replace that evaluation, and the importance of remaining in the ED until their evaluation is complete.  Labs and imaging ordered   Victoria Garrett Victoria Garrett 03/27/23 2020

## 2023-03-27 NOTE — ED Triage Notes (Signed)
 Pt arrived from home via POV c/o headache 7/10 that began this am. Pt states that she felt a popping in her head and that is what brought her to the ED

## 2023-03-28 NOTE — ED Notes (Signed)
 Patient verbalizes understanding of discharge instructions. Opportunity for questioning and answers were provided. Armband removed by staff, pt discharged from ED. Pt ambulatory to ED waiting room with steady gait.

## 2023-03-28 NOTE — Discharge Instructions (Signed)
 Your tests looked good.  Please follow-up with your doctor.  Return for new or worsening symptoms.

## 2023-03-28 NOTE — ED Provider Notes (Signed)
 [facility] Emergency Department Provider Note MRN:  [id number]  Arrival date & time: 03/28/23     Chief Complaint   Headache   History of Present Illness   Victoria Garrett is a 23 y.o. year-old female presents to the ED with chief complaint of headache.  States that she felt a pop above her nose and in her head that felt weird.  She complains of mild headache, but better than before.  Onset of symptoms was this morning.  She states that she is recently recovered from influenza.  She denies recent fevers.  Denies any dysuria.  States that she is just finishing her period.  She denies numbness, weakness, tingling, vision changes, slurred speech.  History provided by patient.   Review of Systems  Pertinent positive and negative review of systems noted in HPI.    Physical Exam   Vitals:   03/27/23 2017 03/27/23 2021  BP: (!) 130/92   Pulse: 93   Resp: 16   Temp: 98 F (36.7 C)   SpO2: 100% 100%    CONSTITUTIONAL:  non toxic-appearing, NAD NEURO:  Alert and oriented x 3, CN 3-12 grossly intact, normal finger to nose, no pronator drift EYES:  eyes equal and reactive ENT/NECK:  Supple, no stridor  CARDIO:  normal rate, regular rhythm, appears well-perfused  PULM:  No respiratory distress, CTAB GI/GU:  non-distended,  MSK/SPINE:  No gross deformities, no edema, moves all extremities  SKIN:  no rash, atraumatic   *Additional and/or pertinent findings included in MDM below  Diagnostic and Interventional Summary    EKG Interpretation Date/Time:    Ventricular Rate:    PR Interval:    QRS Duration:    QT Interval:    QTC Calculation:   R Axis:      Text Interpretation:         Labs Reviewed  URINALYSIS, ROUTINE W REFLEX MICROSCOPIC - Abnormal; Notable for the following components:      Result Value   Color, Urine AMBER (*)    APPearance CLOUDY (*)    Specific Gravity, Urine 1.031 (*)    Hgb urine dipstick MODERATE (*)    Ketones,  ur 5 (*)    Protein, ur 30 (*)    Leukocytes,Ua SMALL (*)    Bacteria, UA RARE (*)    All other components within normal limits  BASIC METABOLIC PANEL - Abnormal; Notable for the following components:   Potassium 3.3 (*)    Glucose, Bld 126 (*)    All other components within normal limits  CBC WITH DIFFERENTIAL/PLATELET - Abnormal; Notable for the following components:   WBC 11.2 (*)    All other components within normal limits  PREGNANCY, URINE    CT Head Wo Contrast  Final Result      Medications - No data to display   Procedures  /  Critical Care Procedures  ED Course and Medical Decision Making  I have reviewed the triage vital signs, the nursing notes, and pertinent available records from the EMR.  Social Determinants Affecting Complexity of Care: Patient has no clinically significant social determinants affecting this chief complaint..   ED Course:    Medical Decision Making Patient here with headache that started this morning.  Reports having felt a pop or pops in the head.  She states that she feels better now.    Afebrile, no neck stiffness.  Doubt meningitis.  Covid and flu negative.   CT ordered in triage is reassuring.  Vitals are stable.  She has normal neuro exam and is in no distress.  Will have her follow-up with PCP or return for new/worsening symptoms.         Consultants: No consultations were needed in caring for this patient.   Treatment and Plan: Emergency department workup does not suggest an emergent condition requiring admission or immediate intervention beyond  what has been performed at this time. The patient is safe for discharge and has  been instructed to return immediately for worsening symptoms, change in  symptoms or any other concerns    Final Clinical Impressions(s) / ED Diagnoses     ICD-10-CM   1. Nonintractable headache, unspecified chronicity pattern, unspecified headache type  R51.9       ED Discharge Orders      None         Discharge Instructions Discussed with and Provided to Patient:     Discharge Instructions      Your tests looked good.  Please follow-up with your doctor.  Return for new or worsening symptoms.       Vicky Charleston, PA-C 03/28/23 AVI    Jerrol Agent, MD 03/28/23 1640

## 2023-04-24 ENCOUNTER — Ambulatory Visit: Payer: Medicaid Other | Admitting: Cardiology

## 2023-05-18 ENCOUNTER — Ambulatory Visit (INDEPENDENT_AMBULATORY_CARE_PROVIDER_SITE_OTHER)

## 2023-05-18 VITALS — BP 115/79 | HR 78

## 2023-05-18 DIAGNOSIS — Z3202 Encounter for pregnancy test, result negative: Secondary | ICD-10-CM | POA: Diagnosis not present

## 2023-05-18 DIAGNOSIS — N926 Irregular menstruation, unspecified: Secondary | ICD-10-CM

## 2023-05-18 DIAGNOSIS — R300 Dysuria: Secondary | ICD-10-CM | POA: Diagnosis not present

## 2023-05-18 DIAGNOSIS — R109 Unspecified abdominal pain: Secondary | ICD-10-CM

## 2023-05-18 LAB — POCT URINALYSIS DIPSTICK
Bilirubin, UA: NEGATIVE
Blood, UA: NEGATIVE
Glucose, UA: NEGATIVE
Ketones, UA: NEGATIVE
Leukocytes, UA: NEGATIVE
Nitrite, UA: NEGATIVE
Protein, UA: NEGATIVE
Spec Grav, UA: 1.015 (ref 1.010–1.025)
Urobilinogen, UA: 0.2 U/dL
pH, UA: 6 (ref 5.0–8.0)

## 2023-05-18 LAB — POCT URINE PREGNANCY: Preg Test, Ur: NEGATIVE

## 2023-05-18 NOTE — Progress Notes (Signed)
 Victoria Garrett here for a UPT. Pt had a positive upt at home. LMP is 04/08/23.    UPT in office Negative.   Pt does complain of right sided abdominal pain. Per Dr. Jolayne Panther okay to do HCG.   SUBJECTIVE: Victoria Garrett is a 23 y.o. female who complains of right sided abdominal pain that she feels may could be the start of a UTI.   OBJECTIVE: Appears well, in no apparent distress.  Vital signs are normal. Urine dipstick shows negative for all components.    ASSESSMENT: Right sided abdominal pain  PLAN: Treatment per orders.  Call or return to clinic prn if these symptoms worsen or fail to improve as anticipated.

## 2023-05-19 LAB — BETA HCG QUANT (REF LAB): hCG Quant: <1 m[IU]/mL

## 2023-05-20 LAB — URINE CULTURE

## 2023-07-08 ENCOUNTER — Other Ambulatory Visit (HOSPITAL_COMMUNITY)
Admission: RE | Admit: 2023-07-08 | Discharge: 2023-07-08 | Disposition: A | Discharge status: Home / Self Care | Source: Ambulatory Visit | Attending: Obstetrics and Gynecology | Admitting: Obstetrics and Gynecology

## 2023-07-08 ENCOUNTER — Ambulatory Visit: Admitting: Obstetrics and Gynecology

## 2023-07-08 ENCOUNTER — Encounter: Payer: Self-pay | Admitting: Obstetrics and Gynecology

## 2023-07-08 VITALS — BP 122/83 | HR 82 | Ht 62.0 in | Wt 172.0 lb

## 2023-07-08 DIAGNOSIS — Z3202 Encounter for pregnancy test, result negative: Secondary | ICD-10-CM | POA: Diagnosis not present

## 2023-07-08 DIAGNOSIS — N926 Irregular menstruation, unspecified: Secondary | ICD-10-CM | POA: Diagnosis present

## 2023-07-08 DIAGNOSIS — N930 Postcoital and contact bleeding: Secondary | ICD-10-CM | POA: Diagnosis not present

## 2023-07-08 DIAGNOSIS — Z3009 Encounter for other general counseling and advice on contraception: Secondary | ICD-10-CM | POA: Diagnosis not present

## 2023-07-08 LAB — POCT URINE PREGNANCY: Preg Test, Ur: NEGATIVE

## 2023-07-08 MED ORDER — NORETHINDRONE 0.35 MG PO TABS: 1.0000 | ORAL_TABLET | Freq: Every day | ORAL | 4 refills | Status: DC

## 2023-07-08 NOTE — Progress Notes (Signed)
   ESTABLISHED GYNECOLOGY VISIT Chief Complaint  Patient presents with   Vaginal Bleeding    Subjective:  Victoria Garrett is a 23 y.o. [id number] presenting for irregular bleeding.  Patient reports that she had a positive pregnancy test at home at the end of march. She also had negative tests. She came to clinic and had a negative beta hcg. She states her period then came late. She subsequently had a regular period then about 1-2 weeks later had more bleeding. This was mid to end of April. She then states she had light bleeding/spotting after sex this month. Not taking anything for birth control. Hasn't taken emergency contraception.  Breastfeeding.   Review of Systems:   Pertinent items are noted in HPI  Pertinent History Reviewed:  Reviewed past medical,surgical, social and family history.  Reviewed problem list, medications and allergies.  Objective:   Vitals:   07/08/23 1450  BP: 122/83  Pulse: 82  Weight: 172 lb (78 kg)  Height: 5\' 2"  (1.575 m)   Physical Examination:   General appearance - well appearing, and in no distress  Mental status - alert, oriented to person, place, and time  Psych:  normal mood and affect  Skin - warm and dry, normal color, no suspicious lesions noted  Abdomen - soft, nontender, nondistended, no masses or organomegaly  Pelvic -  VULVA: normal appearing vulva with no masses, tenderness or lesions   VAGINA: normal appearing vagina with normal color and discharge, no lesions   CERVIX: normal appearing cervix without discharge or lesions, no CMT   UTERUS: uterus is felt to be normal size, shape, consistency and nontender    ADNEXA: No adnexal masses or tenderness noted.  Extremities:  No swelling or varicosities noted  Chaperone present for exam  Assessment and Plan:  1. Irregular bleeding (Primary) Possibly irregular if her +UPT in March was from a chemical pregnancy? Unclear. Also most recent bleeding was associated with sexual  activity and doesn't sound like a true period, though difficult to tell. Patient isn't certain and having trouble remembering all the dates. Will do swabs to check for infection. Patient also wants to start OCP today. Will see if cycle regulates. F/u in 2 months to reassess, if continues, consider pelvic ultrasound and further workup. UPT negative today. - POCT urine pregnancy - Cervicovaginal ancillary only( [place]) - norethindrone (MICRONOR) 0.35 MG tablet; Take 1 tablet (0.35 mg total) by mouth daily.  Dispense: 84 tablet; Refill: 4  2. Postcoital bleeding See above - Cervicovaginal ancillary only( [place])  3. Encounter for counseling regarding contraception Counseled on options. Doesn't like depo, pt breastfeeding. Will do progesterone pills - norethindrone (MICRONOR) 0.35 MG tablet; Take 1 tablet (0.35 mg total) by mouth daily.  Dispense: 84 tablet; Refill: 4    No follow-ups on file.  Future Appointments  Date Time Provider Department Center  11/05/2023 11:00 AM Dellar Fenton, DO [facility] None    Marci Setter, MD, FACOG Obstetrician & Gynecologist, [facility] for [facility], [facility]

## 2023-07-08 NOTE — Progress Notes (Addendum)
 23 y.o. GYN presents for vaginal bleeding, c/o bleeding twice in 10 days, then 8 days later after sexual intercourse, and cramping 7/10.  Pt is not using any BC.  Last PAP 12/01/22 ASCUS.

## 2023-07-09 LAB — CERVICOVAGINAL ANCILLARY ONLY
Chlamydia: NEGATIVE
Comment: NEGATIVE
Comment: NEGATIVE
Comment: NORMAL
Neisseria Gonorrhea: NEGATIVE
Trichomonas: NEGATIVE

## 2023-07-10 ENCOUNTER — Ambulatory Visit: Payer: Self-pay | Admitting: Obstetrics and Gynecology

## 2023-07-12 ENCOUNTER — Other Ambulatory Visit: Payer: Self-pay

## 2023-07-12 ENCOUNTER — Encounter (HOSPITAL_COMMUNITY): Payer: Self-pay

## 2023-07-12 ENCOUNTER — Emergency Department (HOSPITAL_COMMUNITY)
Admission: EM | Admit: 2023-07-12 | Discharge: 2023-07-12 | Disposition: A | Discharge status: Home / Self Care | Attending: Emergency Medicine | Admitting: Emergency Medicine

## 2023-07-12 DIAGNOSIS — N939 Abnormal uterine and vaginal bleeding, unspecified: Secondary | ICD-10-CM | POA: Insufficient documentation

## 2023-07-12 LAB — CBC
HCT: 43.2 % (ref 36.0–46.0)
Hemoglobin: 14.6 g/dL (ref 12.0–15.0)
MCH: 28.7 pg (ref 26.0–34.0)
MCHC: 33.8 g/dL (ref 30.0–36.0)
MCV: 84.9 fL (ref 80.0–100.0)
Platelets: 168 10*3/uL (ref 150–400)
RBC: 5.09 MIL/uL (ref 3.87–5.11)
RDW: 12.7 % (ref 11.5–15.5)
WBC: 9 10*3/uL (ref 4.0–10.5)
nRBC: 0 % (ref 0.0–0.2)

## 2023-07-12 LAB — HCG, SERUM, QUALITATIVE: Preg, Serum: NEGATIVE

## 2023-07-12 NOTE — ED Provider Notes (Addendum)
 [facility] EMERGENCY DEPARTMENT AT [facility] Provider Note   CSN: [id number] Arrival date & time: 07/12/23  1532     History  Chief Complaint  Patient presents with   Vaginal Bleeding   Possible Pregnancy    Sima Lorenzo-Jarquin is a 23 y.o. female.  Patient is a 23 year old female who presents with vaginal bleeding.  She has had some irregular bleeding over the last couple months.  She initially had a home pregnancy test in March that was positive although a follow-up visit with her OB/GYN had revealed a negative test.  She has had some intermittent bleeding since that time.  She saw her OB/GYN 3 days ago.  At that time she had a negative urine pregnancy test.  She also had evaluation for infectious etiology and this was negative.  She has had a little bit of darker blood today.  She was concerned maybe the pregnancy test was wrong and wanted to have confirmation with a blood pregnancy test.  She denies any ongoing abdominal pain although she is having some intermittent cramping.  No dizziness or shortness of breath.       Home Medications Prior to Admission medications   Medication Sig Start Date End Date Taking? Authorizing Provider  benzonatate  (TESSALON ) 200 MG capsule Take 1 capsule (200 mg total) by mouth 3 (three) times daily as needed for cough. Patient not taking: Reported on 05/18/2023 03/20/23   Rodriguez-Southworth, Sylvia, PA-C  ibuprofen  (ADVIL ) 800 MG tablet Take 1 tablet (800 mg total) by mouth 3 (three) times daily. Patient not taking: Reported on 05/18/2023 03/20/23   Rodriguez-Southworth, Sylvia, PA-C  norethindrone (MICRONOR) 0.35 MG tablet Take 1 tablet (0.35 mg total) by mouth daily. 07/08/23   Marci Setter, MD  oseltamivir  (TAMIFLU ) 75 MG capsule Take 1 capsule (75 mg total) by mouth every 12 (twelve) hours. Patient not taking: Reported on 05/18/2023 03/20/23   Rodriguez-Southworth, Sylvia, PA-C      Allergies    Patient has no known allergies.     Review of Systems   Review of Systems  Constitutional:  Negative for chills, diaphoresis, fatigue and fever.  HENT:  Negative for congestion, rhinorrhea and sneezing.   Eyes: Negative.   Respiratory:  Negative for cough, chest tightness and shortness of breath.   Cardiovascular:  Negative for chest pain and leg swelling.  Gastrointestinal:  Negative for abdominal pain, blood in stool, diarrhea, nausea and vomiting.  Genitourinary:  Positive for pelvic pain (Intermittent cramping) and vaginal bleeding. Negative for difficulty urinating, flank pain, frequency, hematuria and vaginal discharge.  Musculoskeletal:  Negative for arthralgias and back pain.  Skin:  Negative for rash.  Neurological:  Negative for dizziness, speech difficulty, weakness, numbness and headaches.    Physical Exam Updated Vital Signs BP (!) 144/91 (BP Location: Right Arm)   Pulse 98   Temp 99 F (37.2 C)   Resp (!) 24   Ht 5\' 2"  (1.575 m)   Wt 78 kg   LMP 04/28/2023 (Approximate)   SpO2 99%   BMI 31.45 kg/m  Physical Exam Constitutional:      Appearance: She is well-developed.  HENT:     Head: Normocephalic and atraumatic.  Eyes:     Pupils: Pupils are equal, round, and reactive to light.  Cardiovascular:     Rate and Rhythm: Normal rate and regular rhythm.     Heart sounds: Normal heart sounds.  Pulmonary:     Effort: Pulmonary effort is normal. No respiratory distress.  Breath sounds: Normal breath sounds. No wheezing or rales.  Chest:     Chest wall: No tenderness.  Abdominal:     General: Bowel sounds are normal.     Palpations: Abdomen is soft.     Tenderness: There is no abdominal tenderness. There is no guarding or rebound.  Musculoskeletal:        General: Normal range of motion.     Cervical back: Normal range of motion and neck supple.  Lymphadenopathy:     Cervical: No cervical adenopathy.  Skin:    General: Skin is warm and dry.     Findings: No rash.  Neurological:      Mental Status: She is alert and oriented to person, place, and time.     ED Results / Procedures / Treatments   Labs (all labs ordered are listed, but only abnormal results are displayed) Labs Reviewed  HCG, SERUM, QUALITATIVE  CBC    EKG None  Radiology No results found.  Procedures Procedures    Medications Ordered in ED Medications - No data to display  ED Course/ Medical Decision Making/ A&P                                 Medical Decision Making Problems Addressed: Abnormal vaginal bleeding: acute illness or injury  Amount and/or Complexity of Data Reviewed External Data Reviewed: labs and notes. Labs: ordered. Decision-making details documented in ED Course.  Risk Decision regarding hospitalization.   Patient is a 23 year old who presents with intermittent vaginal bleeding over the last couple months.  She has recently seen her OB/GYN 3 days ago.  She had pelvic exam evaluation for infections at that time.  I do not feel that she needs a repeat exam today.  She does not report any heavier bleeding.  She does not have any significant abdominal concerns on exam.  Her serum qualitative hCG test is negative.  Her hemoglobin is stable.  She was discharged home in good condition.  She was given a prescription for OCPs by her OB/GYN 3 days ago.  She has not yet started them.  Encouraged her to start them to help regulate her menstrual cycles.  Encouraged her to follow back up with her OB/GYN.  Return precautions were given.  Final Clinical Impression(s) / ED Diagnoses Final diagnoses:  Abnormal vaginal bleeding    Rx / DC Orders ED Discharge Orders     None         Hershel Los, MD 07/12/23 1831    Hershel Los, MD 07/12/23 [id number]

## 2023-07-12 NOTE — ED Triage Notes (Signed)
 Pt had neg preg test at obgyn in march but had pos test at home in march. Pt has been spotting intermittently since then. Sometimes bleeding is heavier than other times. Pt does not have to wear pad or tampon but there is blood when she wipes. Pt is having cramping. Pt has not had regular period since march. Would like serum preg test today

## 2023-09-08 NOTE — Progress Notes (Deleted)
 GYNECOLOGY  VISIT   HPI: Victoria Garrett is a 23 y.o.   Single  {Race/ethnicity:17218}  female  [id number] here for ***       GYNECOLOGIC HISTORY: No LMP recorded. Contraception: {contraception:315051}.   Menopausal hormone therapy: Premenopausal Last mammogram: Never previously done due to age Last pap smear: 12/01/2022 ASCUS HPV negative, 12/12/2020 ASCUS HR HPV positive Diagnosis  Date Value Ref Range Status  12/01/2022 (A)  Final   - Atypical squamous cells of undetermined significance (ASC-US )           OB History     Gravida  3   Para  3   Term  3   Preterm      AB      Living  3      SAB      IAB      Ectopic      Multiple  0   Live Births  3              Patient Active Problem List   Diagnosis Date Noted   Suicide attempt by drug overdose (HCC) 04/18/2022   Severe recurrent major depression without psychotic features (HCC) 03/03/2022   Moderate cannabis use disorder (HCC) 02/23/2022   Thrombocytopenia (HCC) 02/23/2022   MDD (major depressive disorder), recurrent severe, without psychosis (HCC) 02/22/2022   Poor social situation 02/14/2020   PTSD (post-traumatic stress disorder) 03/03/2018   Generalized anxiety disorder 11/25/2017    Past Medical History:  Diagnosis Date   Anemia    Anxiety    ASCUS with positive high risk HPV cervical 04/11/2021   Benign gestational thrombocytopenia in third trimester (HCC) 02/28/2020   Plts 135 at new OB, then 127 at 28 weeks Check platelets at 36 week visit [ ]     Deliberate self-cutting 03/20/2020   Depression    Generalized anxiety disorder with panic attacks 02/14/2020   Heart murmur    as a child    History of pyelonephritis during pregnancy 02/14/2020   Hydronephrosis, right 10/03/2015   Poor social situation 02/14/2020   Preterm uterine contractions in third trimester, antepartum 02/28/2020   Supervision of other normal pregnancy, antepartum 02/13/2020    Nursing Staff Provider Office  Location [facility]  Dating   6w 5d US  Language  ENGLISH Anatomy US    WNL visible in Care everywhere 10/2019 Flu Vaccine  02/14/2020 Genetic Screen  NIPS:   AFP:   First Screen:  Quad:   TDaP Vaccine   02/14/2020 Hgb A1C or  GTT Normal 1 hour 01/04/2020 per [facility] COVID Vaccine NOT VACCINATED   LAB RESULTS  Rhogam   Blood Type    B POS Feeding Plan BREASTFEED A   Thrombocytopenia (HCC)    UTI in pregnancy 03/02/2020   Vs bacteriuria. Treated 1/14. [ ]  TOC   Vaginal Pap smear, abnormal     Past Surgical History:  Procedure Laterality Date   IR NEPHROURETERAL CATH PLACE RIGHT Right 10/2015   NEPHROSTOMY TUBE PLACEMENT ([facility] HX)      Current Outpatient Medications  Medication Sig Dispense Refill   benzonatate  (TESSALON ) 200 MG capsule Take 1 capsule (200 mg total) by mouth 3 (three) times daily as needed for cough. (Patient not taking: Reported on 05/18/2023) 30 capsule 0   ibuprofen  (ADVIL ) 800 MG tablet Take 1 tablet (800 mg total) by mouth 3 (three) times daily. (Patient not taking: Reported on 05/18/2023) 30 tablet 0   norethindrone  (MICRONOR ) 0.35 MG tablet Take 1 tablet (  0.35 mg total) by mouth daily. 84 tablet 4   oseltamivir  (TAMIFLU ) 75 MG capsule Take 1 capsule (75 mg total) by mouth every 12 (twelve) hours. (Patient not taking: Reported on 05/18/2023) 10 capsule 0   No current facility-administered medications for this visit.     ALLERGIES: Patient has no known allergies.  Family History  Problem Relation Age of Onset   Healthy Mother    Healthy Father    Asthma Sister    Diabetes Maternal Grandmother     Social History   Socioeconomic History   Marital status: Single    Spouse name: Not on file   Number of children: 1   Years of education: Not on file   Highest education level: Not on file  Occupational History   Not on file  Tobacco Use   Smoking status: Never   Smokeless tobacco: Never  Vaping Use   Vaping status: Former   Substances: Flavoring   Substance and Sexual Activity   Alcohol use: Not Currently   Drug use: Not Currently    Types: Marijuana    Comment: last smoked 1st week July 2024   Sexual activity: Yes    Partners: Male    Birth control/protection: None  Other Topics Concern   Not on file  Social History Narrative   Not on file   Social Drivers of Health   Financial Resource Strain: High Risk (02/23/2022)   Received from [facility]   Overall Financial Resource Strain (CARDIA)    Difficulty of Paying Living Expenses: Hard  Food Insecurity: No Food Insecurity (08/31/2022)   Hunger Vital Sign    Worried About Running Out of Food in the Last Year: Never true    Ran Out of Food in the Last Year: Never true  Transportation Needs: No Transportation Needs (08/31/2022)   PRAPARE - Administrator, Civil Service (Medical): No    Lack of Transportation (Non-Medical): No  Physical Activity: Not on file  Stress: Stress Concern Present (02/23/2022)   Received from [facility] - Occupational Stress Questionnaire    Feeling of Stress : Very much  Social Connections: Unknown (06/28/2021)   Received from [facility]   Social Network    Social Network: Not on file  Intimate Partner Violence: Not At Risk (08/31/2022)   Humiliation, Afraid, Rape, and Kick questionnaire    Fear of Current or Ex-Partner: No    Emotionally Abused: No    Physically Abused: No    Sexually Abused: No    Review of Systems  PHYSICAL EXAMINATION:    There were no vitals taken for this visit.    General appearance: alert, cooperative and appears stated age Head: Normocephalic, without obvious abnormality, atraumatic Neck: no adenopathy, supple, symmetrical, trachea midline and thyroid  normal to inspection and palpation Lungs: clear to auscultation bilaterally Breasts: normal appearance, no masses or tenderness, No nipple retraction or dimpling, No nipple discharge or bleeding, No axillary  or supraclavicular adenopathy Heart: regular rate and rhythm Abdomen: soft, non-tender, no masses,  no organomegaly Extremities: extremities normal, atraumatic, no cyanosis or edema Skin: Skin color, texture, turgor normal. No rashes or lesions Lymph nodes: Cervical, supraclavicular, and axillary nodes normal. No abnormal inguinal nodes palpated Neurologic: Grossly normal  Pelvic: External genitalia:  no lesions              Urethra:  normal appearing urethra with no masses, tenderness or lesions  Bartholins and Skenes: normal                 Vagina: normal appearing vagina with normal color and discharge, no lesions              Cervix: no lesions                Bimanual Exam:  Uterus:  normal size, contour, position, consistency, mobility, non-tender              Adnexa: no mass, fullness, tenderness              Rectal exam: {yes no:314532}.  Confirms.              Anus:  normal sphincter tone, no lesions  Chaperone was present for exam  ASSESSMENT & PLAN  There are no diagnoses linked to this encounter.      An After Visit Summary was printed and given to the patient.   Vernor Monnig E Debrah Granderson, [place] 7/22/20254:11 PM

## 2023-09-09 ENCOUNTER — Ambulatory Visit: Admitting: Physician Assistant

## 2023-10-13 ENCOUNTER — Ambulatory Visit
Admission: RE | Admit: 2023-10-13 | Discharge: 2023-10-13 | Disposition: A | Discharge status: Home / Self Care | Source: Ambulatory Visit | Attending: Physician Assistant | Admitting: Physician Assistant

## 2023-10-13 VITALS — BP 125/86 | HR 85 | Temp 98.5°F | Resp 16

## 2023-10-13 DIAGNOSIS — N939 Abnormal uterine and vaginal bleeding, unspecified: Secondary | ICD-10-CM | POA: Diagnosis present

## 2023-10-13 LAB — POCT URINE DIPSTICK
Bilirubin, UA: NEGATIVE
Glucose, UA: NEGATIVE mg/dL
Ketones, POC UA: NEGATIVE mg/dL
Leukocytes, UA: NEGATIVE
Nitrite, UA: NEGATIVE
POC PROTEIN,UA: NEGATIVE
Spec Grav, UA: >=1.03 — AB (ref 1.010–1.025)
Urobilinogen, UA: 1 U/dL
pH, UA: 6 (ref 5.0–8.0)

## 2023-10-13 LAB — POCT URINE PREGNANCY: Preg Test, Ur: NEGATIVE

## 2023-10-13 MED ORDER — NAPROXEN 500 MG PO TABS: 500.0000 mg | ORAL_TABLET | Freq: Two times a day (BID) | ORAL | 2 refills | Status: AC

## 2023-10-13 NOTE — ED Triage Notes (Signed)
 Pt states she started spotting today with discharge and then started having abdominal cramping  Pt had menstrual bleeding aug2-7, aug15-18 which were both heavy

## 2023-10-13 NOTE — Discharge Instructions (Addendum)
 Follow up at [facility] as scheduled

## 2023-10-13 NOTE — ED Provider Notes (Signed)
 [facility]    CSN: [id number] Arrival date & time: 10/13/23  1734      History   Chief Complaint Chief Complaint  Patient presents with   Vaginal Bleeding   Abdominal Cramping    HPI Victoria Garrett is a 23 y.o. female.   Patient complains of abnormal vaginal bleeding.  Patient reports her last period was August 3.  Patient reports that she began spotting again today.  Patient reports that she is having cramping.  She is scheduled to see for [facility] clinic on September 2.  Patient came in today for evaluation due to cramping and discomfort.  Patient reports she has not had any fever or chills she has not had any nausea or vomiting or diarrhea.  Patient denies any heavy bleeding.  Patient denies any risk of any sexually transmitted disease.  Patient was on birth control until June.  Patient states she stopped this because it was causing her headaches.   Vaginal Bleeding Abdominal Cramping    Past Medical History:  Diagnosis Date   Anemia    Anxiety    ASCUS with positive high risk HPV cervical 04/11/2021   Benign gestational thrombocytopenia in third trimester (HCC) 02/28/2020   Plts 135 at new OB, then 127 at 28 weeks Check platelets at 36 week visit [ ]     Deliberate self-cutting 03/20/2020   Depression    Generalized anxiety disorder with panic attacks 02/14/2020   Heart murmur    as a child    History of pyelonephritis during pregnancy 02/14/2020   Hydronephrosis, right 10/03/2015   Poor social situation 02/14/2020   Preterm uterine contractions in third trimester, antepartum 02/28/2020   Supervision of other normal pregnancy, antepartum 02/13/2020    Nursing Staff Provider Office Location [facility]  Dating   6w 5d US  Language  ENGLISH Anatomy US    WNL visible in Care everywhere 10/2019 Flu Vaccine  02/14/2020 Genetic Screen  NIPS:   AFP:   First Screen:  Quad:   TDaP Vaccine   02/14/2020 Hgb A1C or  GTT Normal 1 hour 01/04/2020 per [facility] COVID Vaccine NOT VACCINATED   LAB RESULTS  Rhogam   Blood Type    B POS Feeding Plan BREASTFEED A   Thrombocytopenia (HCC)    UTI in pregnancy 03/02/2020   Vs bacteriuria. Treated 1/14. [ ]  TOC   Vaginal Pap smear, abnormal     Patient Active Problem List   Diagnosis Date Noted   Suicide attempt by drug overdose (HCC) 04/18/2022   Severe recurrent major depression without psychotic features (HCC) 03/03/2022   Moderate cannabis use disorder (HCC) 02/23/2022   Thrombocytopenia (HCC) 02/23/2022   MDD (major depressive disorder), recurrent severe, without psychosis (HCC) 02/22/2022   Poor social situation 02/14/2020   PTSD (post-traumatic stress disorder) 03/03/2018   Generalized anxiety disorder 11/25/2017    Past Surgical History:  Procedure Laterality Date   IR NEPHROURETERAL CATH PLACE RIGHT Right 10/2015   NEPHROSTOMY TUBE PLACEMENT ([facility] HX)      OB History     Gravida  3   Para  3   Term  3   Preterm      AB      Living  3      SAB      IAB      Ectopic      Multiple  0   Live Births  3  Home Medications    Prior to Admission medications   Medication Sig Start Date End Date Taking? Authorizing Provider  naproxen  (NAPROSYN ) 500 MG tablet Take 1 tablet (500 mg total) by mouth 2 (two) times daily with a meal. 10/13/23 10/12/24 Yes Flint Sonny POUR, PA-C    Family History Family History  Problem Relation Age of Onset   Healthy Mother    Healthy Father    Asthma Sister    Diabetes Maternal Grandmother     Social History Social History   Tobacco Use   Smoking status: Never   Smokeless tobacco: Never  Vaping Use   Vaping status: Former   Substances: Flavoring  Substance Use Topics   Alcohol use: Not Currently   Drug use: Not Currently    Types: Marijuana    Comment: last smoked 1st week July 2024     Allergies   Patient has no known allergies.   Review of Systems Review of Systems  Genitourinary:  Positive for  vaginal bleeding.  All other systems reviewed and are negative.    Physical Exam Triage Vital Signs ED Triage Vitals  Encounter Vitals Group     BP 10/13/23 1745 125/86     Girls Systolic BP Percentile --      Girls Diastolic BP Percentile --      Boys Systolic BP Percentile --      Boys Diastolic BP Percentile --      Pulse Rate 10/13/23 1745 85     Resp 10/13/23 1745 16     Temp 10/13/23 1745 98.5 F (36.9 C)     Temp src --      SpO2 10/13/23 1745 96 %     Weight --      Height --      Head Circumference --      Peak Flow --      Pain Score 10/13/23 1749 5     Pain Loc --      Pain Education --      Exclude from Growth Chart --    No data found.  Updated Vital Signs BP 125/86 (BP Location: Right Arm)   Pulse 85   Temp 98.5 F (36.9 C)   Resp 16   LMP 10/05/2023 (Exact Date)   SpO2 96%   Breastfeeding Yes   Visual Acuity Right Eye Distance:   Left Eye Distance:   Bilateral Distance:    Right Eye Near:   Left Eye Near:    Bilateral Near:     Physical Exam Vitals and nursing note reviewed.  Constitutional:      Appearance: She is well-developed.  HENT:     Head: Normocephalic.  Cardiovascular:     Rate and Rhythm: Normal rate.  Pulmonary:     Effort: Pulmonary effort is normal.  Abdominal:     General: There is no distension.  Musculoskeletal:        General: Normal range of motion.     Cervical back: Normal range of motion.  Skin:    General: Skin is warm.  Neurological:     General: No focal deficit present.     Mental Status: She is alert and oriented to person, place, and time.      UC Treatments / Results  Labs (all labs ordered are listed, but only abnormal results are displayed) Labs Reviewed  POCT URINE DIPSTICK - Abnormal; Notable for the following components:      Result Value   Spec Grav,  UA >=1.030 (*)    Blood, UA trace-intact (*)    All other components within normal limits  POCT URINE PREGNANCY     EKG   Radiology No results found.  Procedures Procedures (including critical care time)  Medications Ordered in UC Medications - No data to display  Initial Impression / Assessment and Plan / UC Course  I have reviewed the triage vital signs and the nursing notes.  Pertinent labs & imaging results that were available during my care of the patient were reviewed by me and considered in my medical decision making (see chart for details).     UA and urine pregnancy are negative Final Clinical Impressions(s) / UC Diagnoses   Final diagnoses:  Abnormal vaginal bleeding     Discharge Instructions      Follow up at [facility] as scheduled   ED Prescriptions     Medication Sig Dispense Auth. Provider   naproxen  (NAPROSYN ) 500 MG tablet Take 1 tablet (500 mg total) by mouth 2 (two) times daily with a meal. 60 tablet Flint Sonny POUR, PA-C      PDMP not reviewed this encounter. Patient is given a prescription for naproxen  for cramping.  Patient is advised to keep follow-up appointment at [facility] Go to the emergency department if symptoms worsen or change. An After Visit Summary was printed and given to the patient.       Flint Sonny POUR, PA-C 10/13/23 1825

## 2023-10-14 LAB — CERVICOVAGINAL ANCILLARY ONLY
Bacterial Vaginitis (gardnerella): NEGATIVE
Candida Glabrata: NEGATIVE
Candida Vaginitis: NEGATIVE
Chlamydia: NEGATIVE
Comment: NEGATIVE
Comment: NEGATIVE
Comment: NEGATIVE
Comment: NEGATIVE
Comment: NEGATIVE
Comment: NORMAL
Neisseria Gonorrhea: NEGATIVE
Trichomonas: NEGATIVE

## 2023-10-16 ENCOUNTER — Emergency Department (HOSPITAL_COMMUNITY)
Admission: EM | Admit: 2023-10-16 | Discharge: 2023-10-17 | Disposition: A | Discharge status: Home / Self Care | Attending: Emergency Medicine | Admitting: Emergency Medicine

## 2023-10-16 ENCOUNTER — Ambulatory Visit: Payer: Self-pay | Admitting: *Deleted

## 2023-10-16 DIAGNOSIS — E876 Hypokalemia: Secondary | ICD-10-CM | POA: Insufficient documentation

## 2023-10-16 DIAGNOSIS — R1030 Lower abdominal pain, unspecified: Secondary | ICD-10-CM | POA: Insufficient documentation

## 2023-10-16 DIAGNOSIS — N926 Irregular menstruation, unspecified: Secondary | ICD-10-CM | POA: Diagnosis not present

## 2023-10-16 DIAGNOSIS — N939 Abnormal uterine and vaginal bleeding, unspecified: Secondary | ICD-10-CM | POA: Diagnosis present

## 2023-10-16 LAB — URINALYSIS, ROUTINE W REFLEX MICROSCOPIC
Bacteria, UA: NONE SEEN
Bilirubin Urine: NEGATIVE
Glucose, UA: NEGATIVE mg/dL
Ketones, ur: NEGATIVE mg/dL
Leukocytes,Ua: NEGATIVE
Nitrite: NEGATIVE
Protein, ur: NEGATIVE mg/dL
RBC / HPF: >50 RBC/hpf (ref 0–5)
Specific Gravity, Urine: 1.028 (ref 1.005–1.030)
pH: 7 (ref 5.0–8.0)

## 2023-10-16 LAB — COMPREHENSIVE METABOLIC PANEL WITH GFR
ALT: 19 U/L (ref 0–44)
AST: 14 U/L — ABNORMAL LOW (ref 15–41)
Albumin: 4.4 g/dL (ref 3.5–5.0)
Alkaline Phosphatase: 52 U/L (ref 38–126)
Anion gap: 10 (ref 5–15)
BUN: 9 mg/dL (ref 6–20)
CO2: 25 mmol/L (ref 22–32)
Calcium: 9.5 mg/dL (ref 8.9–10.3)
Chloride: 105 mmol/L (ref 98–111)
Creatinine, Ser: 0.61 mg/dL (ref 0.44–1.00)
GFR, Estimated: >60 mL/min (ref >60)
Glucose, Bld: 90 mg/dL (ref 70–99)
Potassium: 3.3 mmol/L — ABNORMAL LOW (ref 3.5–5.1)
Sodium: 140 mmol/L (ref 135–145)
Total Bilirubin: 0.4 mg/dL (ref 0.0–1.2)
Total Protein: 7.4 g/dL (ref 6.5–8.1)

## 2023-10-16 LAB — CBC
HCT: 39.9 % (ref 36.0–46.0)
Hemoglobin: 13.9 g/dL (ref 12.0–15.0)
MCH: 29.3 pg (ref 26.0–34.0)
MCHC: 34.8 g/dL (ref 30.0–36.0)
MCV: 84.2 fL (ref 80.0–100.0)
Platelets: 174 K/uL (ref 150–400)
RBC: 4.74 MIL/uL (ref 3.87–5.11)
RDW: 12.9 % (ref 11.5–15.5)
WBC: 8.1 K/uL (ref 4.0–10.5)
nRBC: 0 % (ref 0.0–0.2)

## 2023-10-16 LAB — LIPASE, BLOOD: Lipase: 30 U/L (ref 11–51)

## 2023-10-16 LAB — HCG, SERUM, QUALITATIVE: Preg, Serum: NEGATIVE

## 2023-10-16 NOTE — Telephone Encounter (Signed)
 Copied from CRM [id number]. Topic: Clinical - Red Word Triage >> Oct 16, 2023 10:37 AM Zane F wrote: [facility] that prompted transfer to Nurse Triage:   Abnormal vaginal bleeding; getting progressively more heavy; side pain  (No chance of pregnancy; negative pregnancy test at [facility])  Pain Level: Varies through the day goes from a 3/10-8/10 Reason for Disposition  [1] Constant abdominal pain AND [2] present > 2 hours    Referred to the ED  Answer Assessment - Initial Assessment Questions 1. BLEEDING SEVERITY: Describe the bleeding that you are having. How much bleeding is there?     She called in on the community line.   I have an appt coming up with my OB_GYN.    I'm getting pain that is moving around from my back to my side.   When I move it hurts in my side.   I don't know what is going on.    I did tell my GYN a couple of months back that I was having random bleeding.   I stopped the birth control pills because I was getting bad headaches.   I made another appt. This month I had a regular period.  It started on 2nd and ended on 7th.   A week later I bled again 16-18 heavy bleeding.   On 26th bleeding again with pink spotting the next day it was bright red.   No clots.   I'm not sure if it's my period.   I'm supposed to start my period tomorrow 8/30.   I'm having stomach, side and back pain.   I'm not sure what is going on.    I called them today to see if I could be seen today.  They are only there 4 hours today.  No openings.   My PCP is in [place].   I'm breastfeeding too.    I referred her to the ED if she didn't want to wait and see her gyn.   I'm fatigued.   2. ONSET: When did the bleeding begin? Is it continuing now?     See above.    3. MENSTRUAL PERIOD: When was the last normal menstrual period? How is this different than your period?     See above 4. REGULARITY: How regular are your periods?     I'm breastfeeding right now 5. ABDOMEN PAIN: Do you have any  pain? How bad is the pain?  (e.g., Scale 0-10; none, mild, moderate, or severe)     Yes  See above 6. PREGNANCY: Is there any chance you are pregnant? When was your last menstrual period?     The urgent are ruled out pregnancy and any STDs.   7. BREASTFEEDING: Are you breastfeeding?     Yes 8. HORMONE MEDICINES: Are you taking any hormone medicines, prescription or over-the-counter? (e.g., birth control pills, estrogen)     I stopped my birth control pills. 9. BLOOD THINNER MEDICINES: Do you take any blood thinners? (e.g., Coumadin / warfarin, Pradaxa / dabigatran, aspirin)     Not asked 10. CAUSE: What do you think is causing the bleeding? (e.g., recent gyn surgery, recent gyn procedure; known bleeding disorder, cervical cancer, polycystic ovarian disease, fibroids)         I don't know.   I want to get to the bottom of this.   I have an appt with my gyn on 10/20/2023. 11. HEMODYNAMIC STATUS: Are you weak or feeling lightheaded? If Yes, ask: Can you stand and  walk normally?        Denies dizziness but having fatigue. 12. OTHER SYMPTOMS: What other symptoms are you having with the bleeding? (e.g., passed tissue, vaginal discharge, fever, menstrual-type cramps)       No clots or anything.   Just heavy vaginal bleeding at irregular times.  Protocols used: Vaginal Bleeding - Abnormal-A-AH

## 2023-10-16 NOTE — ED Triage Notes (Signed)
 Pt came in via POV d/t abnormal vaginal bleeding the past month, reports it is intermittently. Does endorse adb pain, some nausea as well, 5/10 pain level.

## 2023-10-17 MED ORDER — DICYCLOMINE HCL 10 MG/ML IM SOLN
20.0000 mg | Freq: Once | INTRAMUSCULAR | Status: AC
  Administered 2023-10-17: 20 mg via INTRAMUSCULAR
  Filled 2023-10-17: qty 2

## 2023-10-17 MED ORDER — ACETAMINOPHEN 500 MG PO TABS
1000.0000 mg | ORAL_TABLET | Freq: Once | ORAL | Status: AC
  Administered 2023-10-17: 1000 mg via ORAL
  Filled 2023-10-17: qty 2

## 2023-10-17 MED ORDER — POTASSIUM CHLORIDE CRYS ER 20 MEQ PO TBCR
40.0000 meq | EXTENDED_RELEASE_TABLET | Freq: Once | ORAL | Status: AC
  Administered 2023-10-17: 40 meq via ORAL
  Filled 2023-10-17: qty 2

## 2023-10-17 MED ORDER — KETOROLAC TROMETHAMINE 15 MG/ML IJ SOLN
15.0000 mg | Freq: Once | INTRAMUSCULAR | Status: AC
  Administered 2023-10-17: 15 mg via INTRAMUSCULAR
  Filled 2023-10-17: qty 1

## 2023-10-17 NOTE — Discharge Instructions (Signed)
 It was a pleasure taking part in your care.  As discussed, please continue taking naproxen  prescribed you by urgent care.  You may also take Tylenol  with this.  Please follow-up on Tuesday with OB/GYN.  Return to the ED with new symptoms.

## 2023-10-17 NOTE — ED Notes (Signed)
 Patient discharge instructions reviewed and stated understanding. Pt. waiting post IM medications per RN judgement. Patient given perineal pads prior to leaving ED ambulatory with regular gait.

## 2023-10-17 NOTE — ED Provider Notes (Signed)
 [place] EMERGENCY DEPARTMENT AT [facility] Provider Note   CSN: [id number] Arrival date & time: 10/16/23  1815     Patient presents with: Vaginal Bleeding and Abdominal Pain   Victoria Garrett is a 23 y.o. female of PTSD, MDD, GAD.  Patient presents to ED for evaluation of vaginal bleeding, lower abdominal pain.  Reports that she has had intermittent lower abdominal pain and vaginal bleeding since beginning of the month.  States that she has had intermittent spotting.  Reports that she initially had vaginal bleeding from 8/2 days/7 and then resolved.  States that on 8/13 till 8/15 vaginal bleeding persisted.  States that from 8/23 to 8/28 patient continued to have vaginal bleeding.  Reports she was going through 3 menstrual pads a day.  She reports to urgent care on 6/28 and was advised to follow-up with her OB/GYN.  States she has OB/GYN appointment on 2 September.  She reports to ED complaining continued pain.  States he takes Tylenol , ibuprofen  at home.  Reports was given naproxen  at urgent care.  Denies lightheadedness, dizziness, weakness, shortness of breath, fevers, dysuria.  Denies any other concerns.   Abdominal Pain Associated symptoms: vaginal bleeding        Prior to Admission medications   Medication Sig Start Date End Date Taking? Authorizing Provider  naproxen  (NAPROSYN ) 500 MG tablet Take 1 tablet (500 mg total) by mouth 2 (two) times daily with a meal. 10/13/23 10/12/24  Flint Sonny POUR, PA-C    Allergies: Patient has no known allergies.    Review of Systems  Gastrointestinal:  Positive for abdominal pain.  Genitourinary:  Positive for vaginal bleeding.  All other systems reviewed and are negative.   Updated Vital Signs BP 122/86 (BP Location: Right Arm)   Pulse 80   Temp 98.7 F (37.1 C) (Oral)   Resp 18   LMP 10/05/2023 (Exact Date)   SpO2 100%   Physical Exam Vitals and nursing note reviewed.  Constitutional:      General: She is not  in acute distress.    Appearance: She is well-developed.  HENT:     Head: Normocephalic and atraumatic.  Eyes:     Conjunctiva/sclera: Conjunctivae normal.  Cardiovascular:     Rate and Rhythm: Normal rate and regular rhythm.     Heart sounds: No murmur heard. Pulmonary:     Effort: Pulmonary effort is normal. No respiratory distress.     Breath sounds: Normal breath sounds.  Abdominal:     Palpations: Abdomen is soft.     Tenderness: There is no abdominal tenderness.     Comments: Abdomen soft and compressible, no tenderness  Musculoskeletal:        General: No swelling.     Cervical back: Neck supple.  Skin:    General: Skin is warm and dry.     Capillary Refill: Capillary refill takes less than 2 seconds.  Neurological:     Mental Status: She is alert and oriented to person, place, and time. Mental status is at baseline.  Psychiatric:        Mood and Affect: Mood normal.     (all labs ordered are listed, but only abnormal results are displayed) Labs Reviewed  COMPREHENSIVE METABOLIC PANEL WITH GFR - Abnormal; Notable for the following components:      Result Value   Potassium 3.3 (*)    AST 14 (*)    All other components within normal limits  URINALYSIS, ROUTINE W REFLEX MICROSCOPIC -  Abnormal; Notable for the following components:   Hgb urine dipstick MODERATE (*)    All other components within normal limits  LIPASE, BLOOD  CBC  HCG, SERUM, QUALITATIVE    EKG: None  Radiology: No results found.  Procedures   Medications Ordered in the ED  ketorolac  (TORADOL ) 15 MG/ML injection 15 mg (has no administration in time range)  acetaminophen  (TYLENOL ) tablet 1,000 mg (has no administration in time range)  dicyclomine  (BENTYL ) injection 20 mg (has no administration in time range)  potassium chloride  SA (KLOR-CON  M) CR tablet 40 mEq (has no administration in time range)     Medical Decision Making Amount and/or Complexity of Data Reviewed Labs:  ordered.  Risk OTC drugs. Prescription drug management.   This is a 23 year old female presenting to the ED out of concern of vaginal bleeding and pain.  Patient reports intermittent bleeding since beginning of August.  On exam she is hemodynamically stable.  She is afebrile and nontachycardic.  She is not hypoxic, she has lung sounds that are clear bilaterally.  Abdomen is soft and compressible with no tenderness.  Neuroexam at baseline.  Overall patient nontoxic in appearance sitting comfortably in examination room.  Patient reports recently being seen in urgent care, being prescribe naproxen .  Urgent care note reviewed, patient was seen 2 days ago and advised to follow-up with OB/GYN on Tuesday.  Patient reports tonight complaining continued pain.  States she is going through 3 menstrual pads a day.  Lab work initiated in triage include CBC, CMP, lipase, urinalysis and hCG.  Patient CBC without leukocytosis or anemia.  CMP with slight low potassium repleted with 20 mEq oral potassium.  No other electrolyte derangements, no elevated LFTs.  Urinalysis shows moderate hemoglobin.  No nitrates or leukocytes.  hCG negative.  Lipase 30.  Suspect patient symptoms secondary to menstrual pain.  She has no abdominal tenderness here that I am able to appreciate.  She is afebrile and nontachycardic.  She is in no apparent distress on my examination.  She was given Toradol , Bentyl  and Tylenol  for pain.  Reports reduction of pain at this time.  Will be discharged home, advised to continue taking Naproxen  and Tylenol .  Advised to follow-up on Tuesday with OB/GYN.  Return precautions given and she voiced understanding.  Stable to discharge.    Final diagnoses:  Lower abdominal pain  Irregular menstrual bleeding    ED Discharge Orders     None          Ruthell Lonni JULIANNA DEVONNA 10/17/23 0106    Raford Lenis, MD 10/17/23 [phone]

## 2023-10-20 ENCOUNTER — Ambulatory Visit

## 2023-11-03 ENCOUNTER — Encounter: Payer: Self-pay | Admitting: Obstetrics and Gynecology

## 2023-11-03 ENCOUNTER — Ambulatory Visit: Admitting: Obstetrics and Gynecology

## 2023-11-03 VITALS — BP 129/86 | HR 118 | Ht 62.0 in | Wt 174.8 lb

## 2023-11-03 DIAGNOSIS — N926 Irregular menstruation, unspecified: Secondary | ICD-10-CM | POA: Diagnosis not present

## 2023-11-03 DIAGNOSIS — Z3202 Encounter for pregnancy test, result negative: Secondary | ICD-10-CM | POA: Diagnosis not present

## 2023-11-03 DIAGNOSIS — Z3009 Encounter for other general counseling and advice on contraception: Secondary | ICD-10-CM | POA: Diagnosis not present

## 2023-11-03 LAB — POCT URINE PREGNANCY: Preg Test, Ur: NEGATIVE

## 2023-11-03 MED ORDER — ETONOGESTREL-ETHINYL ESTRADIOL 0.12-0.015 MG/24HR VA RING: VAGINAL_RING | VAGINAL | 12 refills | Status: AC

## 2023-11-03 NOTE — Progress Notes (Signed)
 Pt states she had positive home pregnancy test in June; though she tested negative in office. She states her cycle has been very irregular since then, having bled for several days on 3 different occasions in August. Pt went to ER for pain with bleeding at end of August.   Pt is considering birth control patches today.  Pt has not had a cycle yet this month.

## 2023-11-03 NOTE — Progress Notes (Signed)
   ESTABLISHED GYNECOLOGY VISIT Chief Complaint  Patient presents with   Menstrual Problem    Subjective:  Victoria Garrett is a 23 y.o. [id number] presenting for irregular bleeding.  Reports continued irregular bleeding. Stopped POPs due to headaches. POPs actually helped and she wasn't bleeding on them but didn't like the headaches.  Interested in patches.  Review of Systems:   Pertinent items are noted in HPI  Pertinent History Reviewed:  Reviewed past medical,surgical, social and family history.  Reviewed problem list, medications and allergies.  Objective:   Vitals:   11/03/23 1541  BP: 129/86  Pulse: (!) 118  Weight: 174 lb 12.8 oz (79.3 kg)  Height: 5' 2 (1.575 m)  Body mass index is 31.97 kg/m.  Physical Examination:   General appearance - well appearing, and in no distress  Mental status - alert, oriented to person, place, and time  Psych:  normal mood and affect    Assessment and Plan:  1. Irregular bleeding (Primary) Counseled on options for cycle regulation. Patches have increased risk of VTE and decreased efficacy in BMI > 30. Patient interested in nuvaring. Discussed risks of impact on milk supply. Denies history of hypertension, venous thromboembolism, migraines with aura, liver disease. Rx sent. F/u in 3 months to assess response. Will also get pelvic ultrasound. UPT neg - etonogestrel -ethinyl estradiol  (NUVARING) 0.12-0.015 MG/24HR vaginal ring; Insert vaginally and leave in place for 3 consecutive weeks, then remove for 1 week.  Dispense: 1 each; Refill: 12 - US  PELVIC COMPLETE WITH TRANSVAGINAL; Future  2. Birth control counseling - POCT urine pregnancy - etonogestrel -ethinyl estradiol  (NUVARING) 0.12-0.015 MG/24HR vaginal ring; Insert vaginally and leave in place for 3 consecutive weeks, then remove for 1 week.  Dispense: 1 each; Refill: 12    Return in about 3 months (around 02/02/2024) for follow up.  Future Appointments  Date Time Provider  Department Center  11/05/2023 11:00 AM Alm Delon SAILOR, DO [facility] None    Rollo ONEIDA Bring, MD, FACOG Obstetrician & Gynecologist, [facility] for [facility], [facility]

## 2023-11-05 ENCOUNTER — Ambulatory Visit: Admitting: Dermatology

## 2023-11-10 ENCOUNTER — Ambulatory Visit (HOSPITAL_COMMUNITY)

## 2023-11-16 ENCOUNTER — Ambulatory Visit (HOSPITAL_COMMUNITY): Admission: RE | Admit: 2023-11-16 | Source: Ambulatory Visit

## 2024-03-09 ENCOUNTER — Emergency Department (HOSPITAL_COMMUNITY)
Admission: EM | Admit: 2024-03-09 | Discharge: 2024-03-09 | Disposition: A | Discharge status: Home / Self Care | Payer: Self-pay | Attending: Emergency Medicine | Admitting: Emergency Medicine

## 2024-03-09 ENCOUNTER — Encounter (HOSPITAL_COMMUNITY): Payer: Self-pay

## 2024-03-09 ENCOUNTER — Other Ambulatory Visit: Payer: Self-pay

## 2024-03-09 DIAGNOSIS — D72829 Elevated white blood cell count, unspecified: Secondary | ICD-10-CM | POA: Insufficient documentation

## 2024-03-09 DIAGNOSIS — R1031 Right lower quadrant pain: Secondary | ICD-10-CM | POA: Insufficient documentation

## 2024-03-09 DIAGNOSIS — R300 Dysuria: Secondary | ICD-10-CM | POA: Insufficient documentation

## 2024-03-09 DIAGNOSIS — Z79899 Other long term (current) drug therapy: Secondary | ICD-10-CM | POA: Insufficient documentation

## 2024-03-09 DIAGNOSIS — R339 Retention of urine, unspecified: Secondary | ICD-10-CM | POA: Insufficient documentation

## 2024-03-09 LAB — COMPREHENSIVE METABOLIC PANEL WITH GFR
ALT: 20 U/L (ref 0–44)
AST: 16 U/L (ref 15–41)
Albumin: 4.7 g/dL (ref 3.5–5.0)
Alkaline Phosphatase: 54 U/L (ref 38–126)
Anion gap: 10 (ref 5–15)
BUN: 8 mg/dL (ref 6–20)
CO2: 29 mmol/L (ref 22–32)
Calcium: 9.9 mg/dL (ref 8.9–10.3)
Chloride: 103 mmol/L (ref 98–111)
Creatinine, Ser: 0.67 mg/dL (ref 0.44–1.00)
GFR, Estimated: >60 mL/min
Glucose, Bld: 101 mg/dL — ABNORMAL HIGH (ref 70–99)
Potassium: 3.6 mmol/L (ref 3.5–5.1)
Sodium: 141 mmol/L (ref 135–145)
Total Bilirubin: 0.3 mg/dL (ref 0.0–1.2)
Total Protein: 7.5 g/dL (ref 6.5–8.1)

## 2024-03-09 LAB — URINALYSIS, ROUTINE W REFLEX MICROSCOPIC
Bilirubin Urine: NEGATIVE
Glucose, UA: NEGATIVE mg/dL
Ketones, ur: NEGATIVE mg/dL
Nitrite: POSITIVE — AB
Protein, ur: 100 mg/dL — AB
RBC / HPF: >50 RBC/hpf (ref 0–5)
Specific Gravity, Urine: 1.025 (ref 1.005–1.030)
WBC, UA: >50 WBC/hpf (ref 0–5)
pH: 5 (ref 5.0–8.0)

## 2024-03-09 LAB — CBC
HCT: 40.3 % (ref 36.0–46.0)
Hemoglobin: 14 g/dL (ref 12.0–15.0)
MCH: 29.4 pg (ref 26.0–34.0)
MCHC: 34.7 g/dL (ref 30.0–36.0)
MCV: 84.7 fL (ref 80.0–100.0)
Platelets: 155 K/uL (ref 150–400)
RBC: 4.76 MIL/uL (ref 3.87–5.11)
RDW: 12.5 % (ref 11.5–15.5)
WBC: 9.2 K/uL (ref 4.0–10.5)
nRBC: 0 % (ref 0.0–0.2)

## 2024-03-09 LAB — LIPASE, BLOOD: Lipase: 20 U/L (ref 11–51)

## 2024-03-09 LAB — PREGNANCY, URINE: Preg Test, Ur: NEGATIVE

## 2024-03-09 MED ORDER — CEFPODOXIME PROXETIL 200 MG PO TABS: 200.0000 mg | ORAL_TABLET | Freq: Two times a day (BID) | ORAL | 0 refills | Status: AC

## 2024-03-09 MED ORDER — SODIUM CHLORIDE 0.9 % IV SOLN
2.0000 g | Freq: Once | INTRAVENOUS | Status: AC
  Administered 2024-03-09: 2 g via INTRAVENOUS
  Filled 2024-03-09: qty 20

## 2024-03-09 MED ORDER — KETOROLAC TROMETHAMINE 15 MG/ML IJ SOLN
15.0000 mg | Freq: Once | INTRAMUSCULAR | Status: AC
  Administered 2024-03-09: 15 mg via INTRAVENOUS
  Filled 2024-03-09: qty 1

## 2024-03-09 MED ORDER — SODIUM CHLORIDE 0.9 % IV BOLUS
1000.0000 mL | Freq: Once | INTRAVENOUS | Status: AC
  Administered 2024-03-09: 1000 mL via INTRAVENOUS

## 2024-03-09 MED ORDER — MORPHINE SULFATE (PF) 4 MG/ML IV SOLN
4.0000 mg | Freq: Once | INTRAVENOUS | Status: AC
  Administered 2024-03-09: 4 mg via INTRAVENOUS
  Filled 2024-03-09: qty 1

## 2024-03-09 MED ORDER — ONDANSETRON HCL 4 MG/2ML IJ SOLN
4.0000 mg | Freq: Once | INTRAMUSCULAR | Status: AC
  Administered 2024-03-09: 4 mg via INTRAVENOUS
  Filled 2024-03-09: qty 2

## 2024-03-09 NOTE — ED Provider Notes (Signed)
 " [place] EMERGENCY DEPARTMENT AT  HOSPITAL Provider Note   CSN: [id number] Arrival date & time: 03/09/24  1509     Patient presents with: Urinary Retention, Abdominal Pain, and Flank Pain  HPI Victoria Garrett is a 24 y.o. female with remote history of pyelonephritis requiring right sided nephrostomy tube in 2017 presenting for right flank pain.  She states it started this morning with painful urination.  Denies vomiting diarrhea and nausea.  She states the pain is primarily in the right flank but does extend to the right lower quadrant.    Abdominal Pain Flank Pain Associated symptoms include abdominal pain.       Prior to Admission medications  Medication Sig Start Date End Date Taking? Authorizing Provider  cefpodoxime  (VANTIN ) 200 MG tablet Take 1 tablet (200 mg total) by mouth 2 (two) times daily for 12 days. 03/09/24 03/21/24 Yes Renelda Kilian K, PA-C  etonogestrel -ethinyl estradiol  (NUVARING) 0.12-0.015 MG/24HR vaginal ring Insert vaginally and leave in place for 3 consecutive weeks, then remove for 1 week. 11/03/23   Abigail Rollo DASEN, MD  naproxen  (NAPROSYN ) 500 MG tablet Take 1 tablet (500 mg total) by mouth 2 (two) times daily with a meal. Patient not taking: Reported on 11/03/2023 10/13/23 10/12/24  Sofia, Leslie K, PA-C    Allergies: Patient has no known allergies.    Review of Systems  Gastrointestinal:  Positive for abdominal pain.  Genitourinary:  Positive for flank pain.    Updated Vital Signs BP 121/87   Pulse 72   Temp 98.7 F (37.1 C) (Oral)   Resp 18   Ht 5' 2 (1.575 m)   Wt 77.1 kg   LMP  (LMP Unknown)   SpO2 100%   Breastfeeding Yes   BMI 31.09 kg/m   Physical Exam Vitals and nursing note reviewed.  HENT:     Head: Normocephalic and atraumatic.     Mouth/Throat:     Mouth: Mucous membranes are moist.  Eyes:     General:        Right eye: No discharge.        Left eye: No discharge.     Conjunctiva/sclera: Conjunctivae  normal.  Cardiovascular:     Rate and Rhythm: Normal rate and regular rhythm.     Pulses: Normal pulses.     Heart sounds: Normal heart sounds.  Pulmonary:     Effort: Pulmonary effort is normal.     Breath sounds: Normal breath sounds.  Abdominal:     General: Abdomen is flat.     Palpations: Abdomen is soft.     Tenderness: There is right CVA tenderness.  Skin:    General: Skin is warm and dry.  Neurological:     General: No focal deficit present.  Psychiatric:        Mood and Affect: Mood normal.     (all labs ordered are listed, but only abnormal results are displayed) Labs Reviewed  URINALYSIS, ROUTINE W REFLEX MICROSCOPIC - Abnormal; Notable for the following components:      Result Value   APPearance CLOUDY (*)    Hgb urine dipstick LARGE (*)    Protein, ur 100 (*)    Nitrite POSITIVE (*)    Leukocytes,Ua LARGE (*)    Bacteria, UA FEW (*)    All other components within normal limits  COMPREHENSIVE METABOLIC PANEL WITH GFR - Abnormal; Notable for the following components:   Glucose, Bld 101 (*)    All other components within  normal limits  PREGNANCY, URINE  LIPASE, BLOOD  CBC    EKG: None  Radiology: No results found.   Procedures   Medications Ordered in the ED  ketorolac  (TORADOL ) 15 MG/ML injection 15 mg (has no administration in time range)  sodium chloride  0.9 % bolus 1,000 mL (0 mLs Intravenous Stopped 03/09/24 2108)  ondansetron  (ZOFRAN ) injection 4 mg (4 mg Intravenous Given 03/09/24 2008)  morphine  (PF) 4 MG/ML injection 4 mg (4 mg Intravenous Given 03/09/24 2007)  cefTRIAXone  (ROCEPHIN ) 2 g in sodium chloride  0.9 % 100 mL IVPB (0 g Intravenous Stopped 03/09/24 2044)                                    Medical Decision Making Amount and/or Complexity of Data Reviewed Labs: ordered. Radiology: ordered.  Risk Prescription drug management.   Initial Impression and Ddx 24 yo well appearing female presenting for flank pain.  Exam notable for  right CVA tenderness.  No tenderness in the right lower quadrant.  DDx includes pyelonephritis, kidney stone, renal abscess, appendicitis, pregnancy, ovarian torsion, other. Patient PMH that increases complexity of ED encounter: remote history of pyelonephritis requiring right sided nephrostomy tube in 2017   Interpretation of Diagnostics - I independent reviewed and interpreted the labs as followed: Normal renal function, normal WBC, UA with hematuria, positive nitrites and bacteria and leukocytes, negative pregnancy  Patient Reassessment and Ultimate Disposition/Management On reassessment patient states she was feeling much better and pain was well-controlled.  We discussed utility of CT scan but she ultimately deferred which I feel is reasonable.  Will treat for pyelonephritis.  Overall she looks well and workup does not suggest sepsis.  Feel she is appropriate for outpatient management.  We did discussed strict return precautions should her symptoms worsen.  Advised Tylenol  and ibuprofen  for pain.  She received 1 dose of IV ceftriaxone  here.  Sent a 12-day course of cefpodoxime  to her pharmacy.  Urinary advised PCP follow-up.  Discharged good condition.  Patient management required discussion with the following services or consulting groups:  None  Complexity of Problems Addressed Acute complicated illness or Injury  Additional Data Reviewed and Analyzed Further history obtained from: Past medical history and medications listed in the EMR and Prior ED visit notes  Patient Encounter Risk Assessment Consideration of hospitalization      Final diagnoses:  Dysuria    ED Discharge Orders          Ordered    cefpodoxime  (VANTIN ) 200 MG tablet  2 times daily        03/09/24 2112               Lang Norleen POUR, PA-C 03/09/24 2113    Tegeler, Lonni PARAS, MD 03/09/24 2315  "

## 2024-03-09 NOTE — Discharge Instructions (Addendum)
 Evaluation today is overall concerning for pyelonephritis which is a UTI with possible kidney infection.  I am treating you with a 12-day course of Vantin .  Please follow-up with your PCP.  As we discussed if your symptoms worsen, you develop a fever, persistent nausea vomiting diarrhea or any other concerning symptom please return to the ED for further evaluation.  Recommend Tylenol  and ibuprofen  for pain.

## 2024-03-09 NOTE — ED Triage Notes (Signed)
 Pt. Stated, Jesus not been able to pee today hardly and Im having stomach pain and to the right side.

## 2024-03-09 NOTE — ED Provider Triage Note (Signed)
 Emergency Medicine Provider Triage Evaluation Note  Victoria Garrett , a 24 y.o. female  was evaluated in triage.  Pt complains of right lower quadrant pain x 1 day. Patient woke up with lower abdominal pain radiating to groin and flank. Patient experiencing burning with urination, urgency and feelings of retention. Endorses seeing blood and tissue in urine. Patient worried as she had severe pyelonephrosis requiring nephrostomy tube during 2017 pregnancy. Patient states LMP one week ago but experiences irregular menstruation. Patient is sexually active with one partner.  Denies fever, chills, history of kidney stones, history of ovarian cysts.   Review of Systems  Positive: Abdominal pain and difficulty urinating Negative: Chest pain and SOB  Physical Exam  BP (!) 141/81   Pulse 98   Temp 98.1 F (36.7 C)   Resp 17   Ht 5' 2 (1.575 m)   Wt 77.1 kg   LMP  (LMP Unknown)   SpO2 99%   Breastfeeding Yes   BMI 31.09 kg/m  Gen:   Awake, no distress   Resp:  Normal effort  MSK:   Moves extremities without difficulty  Other:  No CVA tenderness  Medical Decision Making  Medically screening exam initiated at 4:08 PM.  Appropriate orders placed.  Victoria Garrett was informed that the remainder of the evaluation will be completed by another provider, this initial triage assessment does not replace that evaluation, and the importance of remaining in the ED until their evaluation is complete.     Harold Tillman DASEN, [place] 03/09/24 1617

## 2024-03-09 NOTE — ED Triage Notes (Signed)
 Pt states infrequent urination since this morning, RLQ pain since last night. No vomiting.

## 2024-03-17 ENCOUNTER — Encounter (HOSPITAL_COMMUNITY): Payer: Self-pay

## 2024-03-17 ENCOUNTER — Other Ambulatory Visit: Payer: Self-pay

## 2024-03-17 ENCOUNTER — Emergency Department (HOSPITAL_COMMUNITY)
Admission: EM | Admit: 2024-03-17 | Discharge: 2024-03-18 | Disposition: A | Discharge status: Home / Self Care | Payer: Self-pay | Attending: Emergency Medicine | Admitting: Emergency Medicine

## 2024-03-17 DIAGNOSIS — K8020 Calculus of gallbladder without cholecystitis without obstruction: Secondary | ICD-10-CM | POA: Insufficient documentation

## 2024-03-17 DIAGNOSIS — R10A1 Flank pain, right side: Secondary | ICD-10-CM

## 2024-03-17 LAB — CBC
HCT: 40.4 % (ref 36.0–46.0)
Hemoglobin: 14.1 g/dL (ref 12.0–15.0)
MCH: 29.3 pg (ref 26.0–34.0)
MCHC: 34.9 g/dL (ref 30.0–36.0)
MCV: 83.8 fL (ref 80.0–100.0)
Platelets: 160 10*3/uL (ref 150–400)
RBC: 4.82 MIL/uL (ref 3.87–5.11)
RDW: 12.8 % (ref 11.5–15.5)
WBC: 9.3 10*3/uL (ref 4.0–10.5)
nRBC: 0 % (ref 0.0–0.2)

## 2024-03-17 LAB — URINALYSIS, ROUTINE W REFLEX MICROSCOPIC
Bilirubin Urine: NEGATIVE
Glucose, UA: NEGATIVE mg/dL
Hgb urine dipstick: NEGATIVE
Ketones, ur: NEGATIVE mg/dL
Leukocytes,Ua: NEGATIVE
Nitrite: NEGATIVE
Protein, ur: NEGATIVE mg/dL
Specific Gravity, Urine: 1.03 (ref 1.005–1.030)
pH: 5 (ref 5.0–8.0)

## 2024-03-17 LAB — BASIC METABOLIC PANEL WITH GFR
Anion gap: 13 (ref 5–15)
BUN: 10 mg/dL (ref 6–20)
CO2: 23 mmol/L (ref 22–32)
Calcium: 9.4 mg/dL (ref 8.9–10.3)
Chloride: 103 mmol/L (ref 98–111)
Creatinine, Ser: 0.65 mg/dL (ref 0.44–1.00)
GFR, Estimated: >60 mL/min
Glucose, Bld: 105 mg/dL — ABNORMAL HIGH (ref 70–99)
Potassium: 3.5 mmol/L (ref 3.5–5.1)
Sodium: 138 mmol/L (ref 135–145)

## 2024-03-17 LAB — HCG, SERUM, QUALITATIVE: Preg, Serum: NEGATIVE

## 2024-03-17 NOTE — ED Triage Notes (Signed)
 Pt is coming in for flank pain she was seen 9 days ago for and was sent home on Abx for pyelonephritis, she has been taking the Abx but still has pain in her right kidney. She is otherwise stable at this time in triage.

## 2024-03-18 ENCOUNTER — Emergency Department (HOSPITAL_COMMUNITY): Payer: Self-pay

## 2024-03-18 MED ORDER — KETOROLAC TROMETHAMINE 15 MG/ML IJ SOLN
15.0000 mg | Freq: Once | INTRAMUSCULAR | Status: AC
  Administered 2024-03-18: 15 mg via INTRAMUSCULAR
  Filled 2024-03-18: qty 1

## 2024-03-18 NOTE — ED Provider Notes (Incomplete)
 " [place] EMERGENCY DEPARTMENT AT [facility] Provider Note   CSN: [id number] Arrival date & time: 03/17/24  2141     Patient presents with: Flank Pain   Victoria Garrett is a 24 y.o. female.  {Add pertinent medical, surgical, social history, OB history to HPI:32947} 24 y/o female with hx of anemia and GAD presents to the ED for right sided flank pain. States that symptoms have been persistent since her prior ED visit on 03/09/24. Patient diagnosed with UTI/suspected pyelonephritis at this appointment. Has been compliant with abx which have been providing little relief. Was previously having dysuria and urinary hesitancy; urinary symptoms have resolved. Does take Tylenol  for pain as needed with mild improvement. Pain aggravated by movement, not postprandial in nature. Patient denies fever, vomiting, SOB, cough, bowel changes, trauma. She reports a hx of sepsis 2/2 UTI in 2017 necessitating R nephrostomy tube placement.  The history is provided by the patient. No language interpreter was used.  Flank Pain       Prior to Admission medications  Medication Sig Start Date End Date Taking? Authorizing Provider  cefpodoxime  (VANTIN ) 200 MG tablet Take 1 tablet (200 mg total) by mouth 2 (two) times daily for 12 days. 03/09/24 03/21/24  Robinson, John K, PA-C  etonogestrel -ethinyl estradiol  (NUVARING) 0.12-0.015 MG/24HR vaginal ring Insert vaginally and leave in place for 3 consecutive weeks, then remove for 1 week. 11/03/23   Abigail Rollo DASEN, MD  naproxen  (NAPROSYN ) 500 MG tablet Take 1 tablet (500 mg total) by mouth 2 (two) times daily with a meal. Patient not taking: Reported on 11/03/2023 10/13/23 10/12/24  Sofia, Leslie K, PA-C    Allergies: Patient has no known allergies.    Review of Systems  Genitourinary:  Positive for flank pain.  Ten systems reviewed and are negative for acute change, except as noted in the HPI.    Updated Vital Signs BP 119/85 (BP Location: Right  Arm)   Pulse 81   Temp 98.3 F (36.8 C) (Oral)   Resp 16   LMP  (LMP Unknown)   SpO2 100%   Physical Exam Vitals and nursing note reviewed.  Constitutional:      General: She is not in acute distress.    Appearance: She is well-developed. She is not diaphoretic.     Comments: Nontoxic appearing and in NAD  HENT:     Head: Normocephalic and atraumatic.  Eyes:     General: No scleral icterus.    Conjunctiva/sclera: Conjunctivae normal.  Cardiovascular:     Rate and Rhythm: Normal rate and regular rhythm.     Pulses: Normal pulses.  Pulmonary:     Effort: Pulmonary effort is normal. No respiratory distress.     Comments: Respirations even and unlabored Abdominal:     Palpations: Abdomen is soft.     Comments: TTP to the right lateral abdomen and flank at the area of the lower ribs. No spasm noted. No masses or crepitus. Abdominal exam is otherwise nontender. Negative Murphy's sign. No peritoneal signs.  Musculoskeletal:        General: Normal range of motion.     Cervical back: Normal range of motion.  Skin:    General: Skin is warm and dry.     Coloration: Skin is not pale.     Findings: No erythema or rash.  Neurological:     Mental Status: She is alert and oriented to person, place, and time.  Psychiatric:  Behavior: Behavior normal.     (all labs ordered are listed, but only abnormal results are displayed) Labs Reviewed  BASIC METABOLIC PANEL WITH GFR - Abnormal; Notable for the following components:      Result Value   Glucose, Bld 105 (*)    All other components within normal limits  URINALYSIS, ROUTINE W REFLEX MICROSCOPIC  HCG, SERUM, QUALITATIVE  CBC    EKG: None  Radiology: CT Renal Stone Study Result Date: 03/18/2024 EXAM: CT ABDOMEN AND PELVIS WITHOUT CONTRAST 03/18/2024 05:37:04 AM TECHNIQUE: CT of the abdomen and pelvis was performed without the administration of intravenous contrast. Multiplanar reformatted images are provided for review.  Automated exposure control, iterative reconstruction, and/or weight-based adjustment of the mA/kV was utilized to reduce the radiation dose to as low as reasonably achievable. COMPARISON: 06/14/2022 CLINICAL HISTORY: Abdominal/flank pain, stone suspected. FINDINGS: LOWER CHEST: No acute abnormality. LIVER: The liver is unremarkable. GALLBLADDER AND BILE DUCTS: Several small stones identified within the gallbladder which measure up to 5 mm. No gallbladder wall thickening, inflammation, or pericholecystic fluid. No biliary ductal dilatation. SPLEEN: No acute abnormality. PANCREAS: No acute abnormality. ADRENAL GLANDS: Normal size and morphology bilaterally. No nodule, thickening, or hemorrhage. No periadrenal stranding. KIDNEYS, URETERS AND BLADDER: No stones in the kidneys or ureters. No hydronephrosis. No perinephric or periureteral stranding. Urinary bladder is unremarkable. GI AND BOWEL: The stomach appears within normal limits. The appendix is visualized measuring up to 6 mm. No periappendiceal fat stranding or free fluid. There is moderate retained formed stool in the sigmoid colon, rectum, and cecum. No bowel wall thickening, inflammation, or distention. PERITONEUM AND RETROPERITONEUM: No ascites. No free air. No free fluid or fluid collections. VASCULATURE: Aorta is normal in caliber. LYMPH NODES: No lymphadenopathy. REPRODUCTIVE ORGANS: The uterus and adnexal structures appear normal. BONES AND SOFT TISSUES: No acute osseous abnormality. No focal soft tissue abnormality. IMPRESSION: 1. Cholelithiasis without CT evidence of acute cholecystitis. 2. No kidney stones or signs of obstructive uropathy. 3. Moderate retained formed stool in the sigmoid colon, rectum, and cecum without bowel wall thickening, inflammation, or distention. Electronically signed by: Waddell Calk MD 03/18/2024 05:47 AM EST RP Workstation: [id number]    {Document cardiac monitor, telemetry assessment procedure when  appropriate:32947} Procedures   Medications Ordered in the ED  ketorolac  (TORADOL ) 15 MG/ML injection 15 mg (15 mg Intramuscular Given 03/18/24 0554)      {Click here for ABCD2, HEART and other calculators REFRESH Note before signing:1}                              Medical Decision Making Amount and/or Complexity of Data Reviewed Labs: ordered. Radiology: ordered.  Risk Prescription drug management.   ***  {Document critical care time when appropriate  Document review of labs and clinical decision tools ie CHADS2VASC2, etc  Document your independent review of radiology images and any outside records  Document your discussion with family members, caretakers and with consultants  Document social determinants of health affecting pt's care  Document your decision making why or why not admission, treatments were needed:32947:::1}   Final diagnoses:  Right flank pain  Gallstones    ED Discharge Orders     None        "
